# Patient Record
Sex: Male | Born: 1955
Health system: Southern US, Community
[De-identification: ages and names within clinical notes are randomized; demographics above are authoritative.]

## PROBLEM LIST (undated history)

## (undated) DIAGNOSIS — N400 Enlarged prostate without lower urinary tract symptoms: Secondary | ICD-10-CM

## (undated) DIAGNOSIS — K219 Gastro-esophageal reflux disease without esophagitis: Secondary | ICD-10-CM

## (undated) DIAGNOSIS — M1990 Unspecified osteoarthritis, unspecified site: Secondary | ICD-10-CM

## (undated) DIAGNOSIS — D649 Anemia, unspecified: Secondary | ICD-10-CM

## (undated) DIAGNOSIS — I739 Peripheral vascular disease, unspecified: Secondary | ICD-10-CM

## (undated) DIAGNOSIS — E785 Hyperlipidemia, unspecified: Secondary | ICD-10-CM

## (undated) DIAGNOSIS — M109 Gout, unspecified: Secondary | ICD-10-CM

## (undated) DIAGNOSIS — I10 Essential (primary) hypertension: Secondary | ICD-10-CM

## (undated) DIAGNOSIS — J449 Chronic obstructive pulmonary disease, unspecified: Secondary | ICD-10-CM

## (undated) HISTORY — PX: FACIAL COSMETIC SURGERY: SHX629

## (undated) HISTORY — PX: OTHER SURGICAL HISTORY: SHX169

## (undated) HISTORY — DX: Gout, unspecified: M10.9

## (undated) HISTORY — DX: Benign prostatic hyperplasia without lower urinary tract symptoms: N40.0

## (undated) HISTORY — DX: Anemia, unspecified: D64.9

## (undated) HISTORY — DX: Chronic obstructive pulmonary disease, unspecified: J44.9

## (undated) HISTORY — DX: Hyperlipidemia, unspecified: E78.5

---

## 1999-10-04 ENCOUNTER — Encounter: Payer: Self-pay | Admitting: Emergency Medicine

## 1999-10-04 ENCOUNTER — Emergency Department (HOSPITAL_COMMUNITY): Admission: EM | Admit: 1999-10-04 | Discharge: 1999-10-04 | Payer: Self-pay | Admitting: Emergency Medicine

## 2002-10-13 ENCOUNTER — Emergency Department (HOSPITAL_COMMUNITY): Admission: EM | Admit: 2002-10-13 | Discharge: 2002-10-13 | Payer: Self-pay | Admitting: Emergency Medicine

## 2005-01-23 ENCOUNTER — Emergency Department (HOSPITAL_COMMUNITY): Admission: EM | Admit: 2005-01-23 | Discharge: 2005-01-23 | Payer: Self-pay | Admitting: Emergency Medicine

## 2006-02-17 ENCOUNTER — Ambulatory Visit (HOSPITAL_COMMUNITY): Admission: RE | Admit: 2006-02-17 | Discharge: 2006-02-17 | Payer: Self-pay | Admitting: Internal Medicine

## 2006-02-17 ENCOUNTER — Ambulatory Visit: Payer: Self-pay | Admitting: Internal Medicine

## 2006-03-29 ENCOUNTER — Ambulatory Visit: Payer: Self-pay | Admitting: Internal Medicine

## 2006-03-29 ENCOUNTER — Ambulatory Visit (HOSPITAL_COMMUNITY): Admission: RE | Admit: 2006-03-29 | Discharge: 2006-03-29 | Payer: Self-pay | Admitting: Internal Medicine

## 2006-09-22 ENCOUNTER — Telehealth: Payer: Self-pay | Admitting: *Deleted

## 2006-12-24 ENCOUNTER — Telehealth: Payer: Self-pay | Admitting: *Deleted

## 2007-01-04 DIAGNOSIS — M25539 Pain in unspecified wrist: Secondary | ICD-10-CM | POA: Insufficient documentation

## 2007-01-04 DIAGNOSIS — F17200 Nicotine dependence, unspecified, uncomplicated: Secondary | ICD-10-CM | POA: Insufficient documentation

## 2007-01-04 DIAGNOSIS — F1010 Alcohol abuse, uncomplicated: Secondary | ICD-10-CM | POA: Insufficient documentation

## 2007-01-04 DIAGNOSIS — M79609 Pain in unspecified limb: Secondary | ICD-10-CM | POA: Insufficient documentation

## 2007-01-04 DIAGNOSIS — K219 Gastro-esophageal reflux disease without esophagitis: Secondary | ICD-10-CM | POA: Insufficient documentation

## 2007-01-04 DIAGNOSIS — R1110 Vomiting, unspecified: Secondary | ICD-10-CM | POA: Insufficient documentation

## 2007-01-04 DIAGNOSIS — J069 Acute upper respiratory infection, unspecified: Secondary | ICD-10-CM | POA: Insufficient documentation

## 2007-01-04 DIAGNOSIS — R030 Elevated blood-pressure reading, without diagnosis of hypertension: Secondary | ICD-10-CM | POA: Insufficient documentation

## 2007-04-05 ENCOUNTER — Telehealth: Payer: Self-pay | Admitting: *Deleted

## 2011-07-22 ENCOUNTER — Ambulatory Visit (INDEPENDENT_AMBULATORY_CARE_PROVIDER_SITE_OTHER): Payer: BC Managed Care – PPO | Admitting: Family Medicine

## 2011-07-22 ENCOUNTER — Ambulatory Visit: Payer: Self-pay | Admitting: Family Medicine

## 2011-07-22 DIAGNOSIS — L723 Sebaceous cyst: Secondary | ICD-10-CM

## 2011-09-27 ENCOUNTER — Ambulatory Visit (INDEPENDENT_AMBULATORY_CARE_PROVIDER_SITE_OTHER): Payer: BC Managed Care – PPO | Admitting: Family Medicine

## 2011-09-27 VITALS — BP 159/95 | HR 90 | Temp 97.6°F | Resp 16 | Ht 68.5 in | Wt 183.4 lb

## 2011-09-27 DIAGNOSIS — IMO0001 Reserved for inherently not codable concepts without codable children: Secondary | ICD-10-CM

## 2011-09-27 DIAGNOSIS — M25569 Pain in unspecified knee: Secondary | ICD-10-CM

## 2011-09-27 DIAGNOSIS — Z716 Tobacco abuse counseling: Secondary | ICD-10-CM

## 2011-09-27 DIAGNOSIS — F17200 Nicotine dependence, unspecified, uncomplicated: Secondary | ICD-10-CM

## 2011-09-27 DIAGNOSIS — Z720 Tobacco use: Secondary | ICD-10-CM

## 2011-09-27 LAB — URIC ACID: Uric Acid, Serum: 8.8 mg/dL — ABNORMAL HIGH (ref 4.0–7.8)

## 2011-09-27 MED ORDER — VARENICLINE TARTRATE 0.5 MG X 11 & 1 MG X 42 PO MISC: ORAL | Status: AC

## 2011-09-27 MED ORDER — VARENICLINE TARTRATE 1 MG PO TABS: ORAL_TABLET | ORAL | Status: DC

## 2011-09-27 MED ORDER — INDOMETHACIN 50 MG PO CAPS: 50.0000 mg | ORAL_CAPSULE | Freq: Two times a day (BID) | ORAL | Status: DC

## 2011-09-27 NOTE — Progress Notes (Signed)
  [facility]:  Office Visit  Chief Complaint:  Chief Complaint  Patient presents with  . Arthritis    suspects gout left knee. flare-up X 2 days  . Eye Problem    skin tag on right eyelid     HPI: Hector [name] is a 56 y.o. male who complains of  Left knee pain x 1 day. H/o gout. Has not tried anything. + gout and increase   History reviewed. No pertinent past medical history. Past Surgical History  Procedure Date  . Right hand fracture    History   Social History  . Marital Status: Married    Spouse Name: N/A    Number of Children: N/A  . Years of Education: N/A   Social History Main Topics  . Smoking status: Current Everyday Smoker -- 2.0 packs/day for 30 years    Types: Cigarettes  . Smokeless tobacco: None  . Alcohol Use: 4.2 oz/week    7 Cans of beer per week  . Drug Use: No  . Sexually Active: None   Other Topics Concern  . None   Social History Narrative  . None   Family History  Problem Relation Age of Onset  . Cancer Mother    No Known Allergies Prior to Admission medications   Not on File     ROS: The patient denies fevers, chills, night sweats, unintentional weight loss, chest pain, palpitations, wheezing, dyspnea on exertion, nausea, vomiting, abdominal pain, dysuria, hematuria, melena, numbness, weakness, or tingling.+ knee pain  All other systems have been reviewed and were otherwise negative with the exception of those mentioned in the HPI and as above.    PHYSICAL EXAM: Filed Vitals:   09/27/11 1123  BP: 159/95  Pulse:   Temp:   Resp:    Filed Vitals:   09/27/11 1036  Height: 5' 8.5" (1.74 m)  Weight: 183 lb 6.4 oz (83.19 kg)   Body mass index is 27.48 kg/(m^2).  General: Alert, no acute distress HEENT:  Normocephalic, atraumatic, oropharynx patent.  Cardiovascular:  Regular rate and rhythm, no rubs murmurs or gallops.  No Carotid bruits, radial pulse intact. No pedal edema.  Respiratory: Clear to  auscultation bilaterally.  No wheezes, rales, or rhonchi.  No cyanosis, no use of accessory musculature GI: No organomegaly, abdomen is soft and non-tender, positive bowel sounds.  No masses. Skin: No rashes. Neurologic: Facial musculature symmetric. Psychiatric: Patient is appropriate throughout our interaction. Lymphatic: No cervical lymphadenopathy Musculoskeletal: Gait intact. Left knee-medial jt line tenderness, no effusion, otherwise normal, + prominent boney structure but no erythema.    LABS: No results found for this or any previous visit.   EKG/XRAY:   Primary read interpreted by Dr. Conley Rolls at [facility].   ASSESSMENT/PLAN: Encounter Diagnoses  Name Primary?  . Knee pain Yes  . Tobacco abuse counseling   . Tobacco abuse   . Elevated BP    Check uric acid Rx INdomethacin x 7 days. Take with food Wants to quit smoking. Will give strater pack for CHantix and then also give 2 month supply. Denies SI/HI. Risks and benefits explained to patient.  Monitor BP if higher than 140/90 then come back for HTN , currently not diagnosed.     Charlie Char PHUONG, DO 09/27/2011 11:24 AM

## 2011-10-05 ENCOUNTER — Telehealth: Payer: Self-pay | Admitting: Family Medicine

## 2011-10-05 ENCOUNTER — Other Ambulatory Visit: Payer: Self-pay | Admitting: Family Medicine

## 2011-10-05 DIAGNOSIS — M25569 Pain in unspecified knee: Secondary | ICD-10-CM

## 2011-10-05 MED ORDER — INDOMETHACIN 50 MG PO CAPS: 50.0000 mg | ORAL_CAPSULE | Freq: Two times a day (BID) | ORAL | Status: DC

## 2011-10-05 NOTE — Telephone Encounter (Signed)
Spoke to patient about uric acid results. Will give him one refill on indomethacin and if he has another gouty flare-up he can take it but needs to consider maintenance with allopurinol. I am hesitant to do this since he drinks 6-7 beers daily. Needs f/uy for blood pressure and also tobacco cessation.

## 2012-01-03 ENCOUNTER — Ambulatory Visit (INDEPENDENT_AMBULATORY_CARE_PROVIDER_SITE_OTHER): Payer: BC Managed Care – PPO | Admitting: Emergency Medicine

## 2012-01-03 VITALS — BP 102/68 | HR 99 | Temp 97.9°F | Resp 18 | Ht 68.75 in | Wt 176.6 lb

## 2012-01-03 DIAGNOSIS — T675XXA Heat exhaustion, unspecified, initial encounter: Secondary | ICD-10-CM

## 2012-01-03 LAB — POCT CBC
Granulocyte percent: 64 %G (ref 37–80)
HCT, POC: 45.9 % (ref 43.5–53.7)
Lymph, poc: 2.6 (ref 0.6–3.4)
MCHC: 31.2 g/dL — AB (ref 31.8–35.4)
MCV: 88.7 fL (ref 80–97)
POC LYMPH PERCENT: 28.1 %L (ref 10–50)
RDW, POC: 13.3 %

## 2012-01-03 LAB — BASIC METABOLIC PANEL
Calcium: 9.6 mg/dL (ref 8.4–10.5)
Creat: 0.95 mg/dL (ref 0.50–1.35)

## 2012-01-03 NOTE — Patient Instructions (Signed)
Heat Disorders  Heat related disorders are illnesses caused by continued exposure to hot and humid environments, not drinking enough fluids, and/or your body failing to regulate its temperature correctly. People suffer from heat stress and heat related disorders when their bodies are unable to compensate and cool down through sweating. With sufficient heat, sweating is not enough to keep you cool, and your body temperature can rise quickly. Very high body temperatures can damage your brain and other vital organs. High humidity (moisture in the air), adds to heat stress, because it is harder for sweat to evaporate and cool your body. Heat stress and disorders are not uncommon. Some medicines can increase your risk for heat related illness. Ask your caregiver about your medicines during periods of intense heat.   Heat related disorders include:   Heatstroke. When you cannot sweat or regulate your body temperature in an adequate way. This is very dangerous and can be life threatening. Get emergency medical help.   Heat exhaustion. Overheating causes heavy sweating and a fast heart rate. Your body can still regulate its own temperature.   Heat cramps. Painful, uncontrollable muscle spasms. Can occur during heavy exercise in hot environments.   Sunburn. Skin becomes red and painful (burned) after being out in the sun.   Heat rash. Sweat ducts become blocked, which traps sweat under the skin. This causes blisters and red bumps and may cause an itchy or tingling feeling.  PREVENTING HEAT STRESS AND HEAT RELATED DISORDERS  Overheating can be dangerous and life threatening. When exercising, working, or doing other activities in hot and humid environments, do the following:   Stay informed by listening to and watching local broadcast weather and safety updates during intense heat.   Air conditioning is the best way to prevent heat disorders. If your home is not air conditioned, spend time in air conditioned places  (malls, public libraries, or heat shelters set up by your local health department).   Wear light-weight, light colored, loose fitting clothing. Wear as little clothing as possible when at home.   Increase your fluid intake. Drink enough water and fluids to keep your urine clear or pale yellow. DO NOT WAIT UNTIL YOU ARE THIRSTY TO DRINK. You may already be heat stressed, and not recognize it.   If your caregiver has suggested that you limit the amount of fluid you drink or has prescribed water pills for a medical problem, ask how much you should drink when the weather is hot.   Do not drink liquids with alcohol, caffeine, or lots of sugar. They can cause more loss of body fluid.   Heavy sweating drains your body's salt and minerals, which must be replaced. If you must exercise in the heat, a sports beverage can replace the salt and minerals you lose in sweat. If you are on a low-salt diet, check with your caregiver before drinking a sports beverage.   Sunburn reduces your body's ability to cool itself and causes a loss of needed body fluids. If you go outdoors, protect yourself from the sun by wearing a wide-brimmed hat, along with sunglasses.   Put on sunscreen of SPF 15 or higher, 30 minutes before going out. (The most effective products say "broad spectrum" or "UVA/UVB protection" on the label.) Reapply sunscreen frequently -- at least every 1-2 hours.   Take added precautions when both the heat and humidity are high.   Rest often.   Even young and otherwise healthy people can become heat stressed and suffer   from a heat disorder, if they participate in strenuous activities during hot weather.   If you must be outdoors, try going out only during morning and evening hours, when it is cooler. Rest often in shady areas, so that your body's temperature can adjust.   If your heart pounds or you are gasping for breath, STOP all activity. Go immediately to a cool area, or at least into the shade, and rest.  This is especially true if you become lightheaded, confused, weak, or faint.   Electric fans may make you comfortable, but they DO NOT prevent heat related problems.  SYMPTOMS    Headache.   Nosebleed.   Weakness.   You feel very hot.   Muscle cramps.   Restlessness.   Fainting or dizziness.   Fast breathing and shortness of breath.   Excessive sweating. (There may be little or no sweating in late stages of heat exhaustion.)   Rapid pulse, heart pounding.   Feeling sick to your stomach (nauseous, vomiting).   Skin becoming cold and clammy, or excessively hot and dry.  HOME CARE INSTRUCTIONS    Lie down and rest in a cool or air conditioned area.   Drink enough water and fluids to keep your urine clear or pale yellow. Avoid fluids with caffeine or high sugar content. Avoid coffee, tea, alcohol or stimulants.   Do not take salt tablets, unless advised by your caregiver.   Avoid hot foods and heavy meals.   Bathe or shower in cool water.   Wear minimal clothing.   Use a fan. Add cool or warm mist to the air, if possible.   If possible, decrease the use of your stove or oven at home.   Monitor adults at risk at least twice a day, watching closely for signs of heat exhaustion or heat stroke. Infants and young children also require more frequent watching.   Never leave infants, children or pets in a parked car, even if the windows are cracked open.   If you are 65 years of age or older, have a friend or relative call to check on you twice a day during a heat wave. If you know someone in this age group, check on them at least twice a day.  SEEK IMMEDIATE MEDICAL CARE IF:   You have a hard time breathing.   You vomit or pass blood in your stool.   You have a seizure, feel dizzy or faint, or pass out.   You develop severe sweating.   Your skin is red, hot and dry (there is no sweating).   Your urine turns a dark color or has blood in it.   You are making very little or no urine.   You are  unable to keep fluids down.   You develop chest or abdominal pain.   You develop a throbbing headache.   You develop nausea or confusion.  IF YOU OBSERVE SOMEONE WHO MIGHT HAVE HEAT STROKE  This can be life threatening. Call your local emergency services (911 in the [place]   If the victim is in the sun, get him or her to a shady area.   Cool the victim rapidly, using whatever methods you have:   Place the victim in a tub of cool water or a cool shower.   Spray the victim with cool water from a garden hose, or sponge the person with cool water.   Wrap the victim in a cool, wet sheet and fan them.     If emergency medical help is delayed, call the hospital emergency room or your local emergency services (911 in the [place] for further instructions.   Sometimes a victim's muscles will begin to twitch from heat stroke. If this happens, keep the victim from injuring himself. However, do not place any object in the mouth. Give fluids, unless the muscle twitching makes it difficult or unsafe to do so. If there is vomiting, make sure the airway remains open by turning the victim on his or her side.  Document Released: 06/19/2000 Document Revised: 06/11/2011 Document Reviewed: 04/08/2009  [employer] Patient Information 2012 [employer].

## 2012-01-03 NOTE — Progress Notes (Signed)
  Subjective:    Patient ID: Hector [name], male    DOB: 1955-08-12, 56 y.o.   MRN: [id number]  HPI Comments: Works as a Designer, fashion/clothing working outside.  Heavy sweater and attempts to remain hydrated.  Becomes fatigued.  Says that on Friday he developed a 'cramp' in the tongue and weakness in his had that resolved prior to return home.  Feeling "bad' for 2 weeks.  Extremity Weakness  This is a new problem. The current episode started in the past 7 days. There has been no history of extremity trauma. The problem occurs intermittently. The problem has been resolved. The patient is experiencing no pain. Associated symptoms include numbness. Pertinent negatives include no fever, inability to bear weight, joint locking, joint swelling or stiffness. The symptoms are aggravated by activity and heat. He has tried rest for the symptoms. The treatment provided significant relief. Family history does not include gout or rheumatoid arthritis. There is no history of diabetes, gout, osteoarthritis or rheumatoid arthritis.      Review of Systems  Constitutional: Positive for fatigue. Negative for fever, chills, diaphoresis, activity change, appetite change and unexpected weight change.  HENT: Negative.   Eyes: Negative.   Respiratory: Negative.   Cardiovascular: Negative.   Gastrointestinal: Negative.   Genitourinary: Negative.   Musculoskeletal: Positive for extremity weakness. Negative for myalgias, back pain, joint swelling, arthralgias, stiffness and gout.  Skin: Negative.   Neurological: Positive for numbness.  Hematological: Negative.        Objective:   Physical Exam  Constitutional: He is oriented to person, place, and time. He appears well-developed and well-nourished.  HENT:  Head: Normocephalic and atraumatic.  Right Ear: External ear normal.  Left Ear: External ear normal.  Eyes: Conjunctivae and EOM are normal. Pupils are equal, round, and reactive to light. No scleral icterus.  Neck: Normal  range of motion. Neck supple.  Cardiovascular: Normal rate, regular rhythm and normal heart sounds.  Exam reveals no gallop and no friction rub.   No murmur heard. Pulmonary/Chest: Effort normal and breath sounds normal.  Abdominal: Soft.  Musculoskeletal: Normal range of motion. He exhibits no edema and no tenderness.  Neurological: He is alert and oriented to person, place, and time. He exhibits normal muscle tone. Coordination normal.  Skin: Skin is warm and dry.          Assessment & Plan:  Likely heat exhaustion Encouraged to drink more fluids.  Doubt cns source.  Will check labs

## 2012-07-05 ENCOUNTER — Other Ambulatory Visit: Payer: Self-pay | Admitting: Family Medicine

## 2012-07-05 ENCOUNTER — Telehealth: Payer: Self-pay

## 2012-07-05 NOTE — Telephone Encounter (Signed)
Left message, for him to call back, what medication is he in need of?

## 2012-07-05 NOTE — Telephone Encounter (Signed)
Needs OV.  

## 2012-07-05 NOTE — Telephone Encounter (Signed)
Patient request refill of medication for gout. State wife threw out rx. [facility] [phone]

## 2012-07-06 NOTE — Telephone Encounter (Signed)
Indocin sent in yesterday.

## 2012-09-28 ENCOUNTER — Telehealth: Payer: Self-pay

## 2012-09-28 MED ORDER — INDOMETHACIN 50 MG PO CAPS: 50.0000 mg | ORAL_CAPSULE | Freq: Two times a day (BID) | ORAL | Status: DC

## 2012-09-28 NOTE — Telephone Encounter (Signed)
Please advise, patient was advised last fill in Dec, he was due for visit.

## 2012-09-28 NOTE — Telephone Encounter (Signed)
Refill  indomethacin (INDOCIN) 50 MG capsule Gout medicines  [facility]   [phone] (M)

## 2012-09-28 NOTE — Telephone Encounter (Signed)
Sent to pharmacy, but pt due for OV

## 2012-10-16 ENCOUNTER — Ambulatory Visit: Payer: BC Managed Care – PPO

## 2012-10-16 ENCOUNTER — Ambulatory Visit (INDEPENDENT_AMBULATORY_CARE_PROVIDER_SITE_OTHER): Payer: BC Managed Care – PPO | Admitting: Family Medicine

## 2012-10-16 VITALS — BP 140/88 | HR 92 | Temp 98.5°F | Resp 16 | Ht 69.0 in | Wt 186.0 lb

## 2012-10-16 DIAGNOSIS — IMO0002 Reserved for concepts with insufficient information to code with codable children: Secondary | ICD-10-CM

## 2012-10-16 DIAGNOSIS — M25562 Pain in left knee: Secondary | ICD-10-CM

## 2012-10-16 DIAGNOSIS — M1710 Unilateral primary osteoarthritis, unspecified knee: Secondary | ICD-10-CM

## 2012-10-16 DIAGNOSIS — M1712 Unilateral primary osteoarthritis, left knee: Secondary | ICD-10-CM

## 2012-10-16 DIAGNOSIS — M25569 Pain in unspecified knee: Secondary | ICD-10-CM

## 2012-10-16 MED ORDER — MELOXICAM 15 MG PO TABS: 15.0000 mg | ORAL_TABLET | Freq: Every day | ORAL | Status: DC

## 2012-10-16 NOTE — Progress Notes (Signed)
[address]   [place], [place]  [address]   [phone]  Subjective:    Patient ID: Hector [name], male    DOB: [birth year], 57 y.o.   MRN: [id number]  HPI This 57 y.o. male presents for evaluation of knee pain L.  Evaluated one year ago for L knee pain.  No injury to knee; did twist knee years ago.  +swelling.  Swelling mild today; has some tightness in back.  +intermittent popping.  No giving out.  Prescribed Indomethacin 09/2011 with some relief.  No other medications.  Daily pain.  No nighttime awakening.  Climbing stairs, bending, squatting.  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     Review of Systems  Constitutional: Negative for fever, chills, diaphoresis and fatigue.  Musculoskeletal: Positive for joint swelling, arthralgias and gait problem.  Skin: Negative for color change, pallor, rash and wound.    Past Medical History  Diagnosis Date  . Gout     L knee    Past Surgical History  Procedure Laterality Date  . Right hand fracture      Prior to Admission medications   Medication Sig Start Date End Date Taking? Authorizing Provider  indomethacin (INDOCIN) 50 MG capsule Take 1 capsule (50 mg total) by mouth 2 (two) times daily with a meal. NEED OFFICE VISIT 09/28/12   Godfrey Pick, PA-C  varenicline (CHANTIX CONTINUING MONTH PAK) 1 MG tablet Use this once you are done with the starter pack 09/27/11   Thao P Le, DO    No Known Allergies  History   Social History  . Marital Status: Single    Spouse Name: N/A    Number of Children: N/A  . Years of Education: N/A   Occupational History  . Not on file.   Social History Main Topics  . Smoking status: Current Every Day Smoker -- 2.00 packs/day for 30 years    Types: Cigarettes  . Smokeless tobacco: Not on file  . Alcohol Use: 4.2 oz/week    7 Cans of beer per week  . Drug Use: No  . Sexually Active: Not on file   Other Topics Concern  . 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 Cancer Mother        Objective:   Physical Exam  Nursing note and vitals reviewed. Constitutional: He is oriented to person, place, and time. He appears well-developed and well-nourished. No distress.  Musculoskeletal:       Left knee: He exhibits normal range of motion, no swelling, no effusion, no ecchymosis, no deformity, no laceration, no erythema, normal alignment, no bony tenderness, normal meniscus and no MCL laxity. Tenderness found. Medial joint line and lateral joint line tenderness noted. No MCL, no LCL and no patellar tendon tenderness noted.  L KNEE:  NO EFFUSION; +TTP MEDIAL AND LATERAL JOINT LINES; NO PATELLAR TTP; NO PATELLAR TENDON TENDERNESS; FULL EXTENSION AND FLEXION; MILD DISCOMFORT WITH FLEXION.  LACHMAN'S AND ANTERIOR DRAWER NEGATIVE; V/V STRAIN INTACT.  NORMAL GAIT.  Neurological: He is alert and oriented to person, place, and time.  Skin: Skin is warm and dry. No rash noted. He is not diaphoretic. No erythema.  NO WARMTH TO SKIN ALONG KNEE L.  Psychiatric: He has a normal mood and affect. His behavior is normal.   [facility] reading (PRIMARY) by  Dr. Katrinka Blazing.  L KNEE XRAY:  MODERATE DEGENERATIVE CHANGES WITH SPURRING.       Assessment & Plan:  Pain, knee, left - Plan: DG Knee Complete 4 Views Left  Osteoarthritis of left knee   1.  L knee pain:  Persistent.  Consistent with osteoarthritis in origin; not consistent with gouty attack.  Rx for Meloxicam provided. 2.  L knee osteoarthritis:  New.  Rx for Meloxicam provided; recommend rest, ice, elevate, NSAIDs.  Refer to ortho due to chronic nature of current pain.  Meds ordered this encounter  Medications  . meloxicam (MOBIC) 15 MG tablet    Sig: Take 1 tablet (15 mg total) by mouth daily. As needed for knee pain    Dispense:  30 tablet    Refill:  3

## 2012-10-16 NOTE — Patient Instructions (Addendum)
Pain, knee, left - Plan: DG Knee Complete 4 Views Left, meloxicam (MOBIC) 15 MG tablet  Osteoarthritis of left knee - Plan: meloxicam (MOBIC) 15 MG tablet  Combined Knee Ligament Sprain Combined knee ligament sprain is a tear of more than one of the major ligaments of the knee. The four knee ligaments are the anterior cruciate ligament (ACL), posterior cruciate ligament (PCL), medial collateral ligament (MCL) and lateral collateral ligament (LCL). Ligaments connect bones. They often cross a joint to hold the bones together. The ligaments of the knee keep the thigh bone (femur) and shinbone (tibia) in alignment. These ligaments allow the joint to move within a certain range of motion. Movement outside this range causes a ligament strain. Injury to multiple ligaments at the same time results in difficulty playing sports and in daily living. The most common multiple knee ligament injury involves the ACL and MCL. SYMPTOMS   A "popping" sound heard or felt at the time of injury.  Inability to continue activity after injury.  Inflammation of the knee within 6 hours after injury.  Possibly, deformity of the knee.  Inability to straighten the knee.  Feeling of the knee giving way or buckling.  Sometimes, locking of the knee, if the joint cartilage (meniscus) is injured.  Rarely, numbness, weakness, paralysis, discoloration, or coldness, due to nerve or blood vessel injury. CAUSES  Spraining of multiple ligaments occurs when a force is placed on the ligaments that exceeds their strength. This is often caused by a direct hit (trauma). It may also be caused by a non-contact injury (hyperextending the knee while twisting it).  RISK INCREASES WITH:  Contact sports (football, rugby, lacrosse). Sports that involve pivoting, jumping, cutting, or changing direction (basketball, gymnastics, soccer, volleyball). Sports on uneven ground (cross-country running, soccer).  Poor strength and/or  flexibility.  Improper fitted or padded equipment. PREVENTION  Warm up and stretch properly before activity.  Maintain physical fitness:  Thigh, leg, and knee flexibility.  Muscle strength and endurance.  Learn and use proper exercise technique.  Wear proper and well fitting equipment (correct length of cleats for surface). PROGNOSIS  Without treatment, the knee will continue to give way and become vulnerable to recurring injury. Recurring injury can happen during athletics or daily living. If the injury includes damage to a nerve or artery, the chance of a poor outcome increases. Surgery is often needed to regain stability of the knee. RELATED COMPLICATIONS  Frequently recurring symptoms, including:  Knee giving way.  Joint instability.  Inflammation.  Injury to the joint cartilage (meniscus). This may result in locking and/or swelling of the knee.  Injury to joint (articular) cartilage of the thigh bone or shinbone. This may result in arthritis of the knee.  Injury to other ligaments of the knee.  Knee stiffness (loss of knee motion).  Permanent injury to nerves (numbness, weakness, or paralysis) or arteries.  Removal (amputation) of the leg, due to nerve or artery injury. TREATMENT  Treatment first involves medicine and ice, to reduce pain and inflammation. Crutches may be advised, to decrease pain while walking. The knee may be restrained. Rehabilitation focuses on reducing swelling, regaining range of motion, and regaining muscle control and strength. It may also include receiving proper use training, wearing a brace, and education. (Avoid sports that involve pivoting, cutting, changing direction, jumping and landing). Surgery often offers the best chance for full recovery. Surgery from combined ACL/MCL injury involves replacement (reconstruction) of the ACL. This also allows for MCL healing. Despite surgery, some  athletes may never return to their prior level of  competition. The ability to return to sports depends on the related injuries and demands of the sport.  MEDICATION   If pain medicine is needed, nonsteroidal anti-inflammatory medicines (aspirin and ibuprofen), or other minor pain relievers (acetaminophen), are often advised.  Do not take pain medicine for 7 days before surgery.  Stronger pain relievers may be prescribed. Use only as directed and only as much as you need.  Contact your caregiver immediately if any bleeding, stomach upset, or signs of an allergic reaction occur. COLD THERAPY  Cold treatment (icing) should be applied for 10 to 15 minutes every 2 to 3 hours for inflammation and pain, and immediately after activity that aggravates your symptoms. Use ice packs or an ice massage. SEEK MEDICAL CARE IF:   Symptoms get worse or do not improve in 6 weeks, despite treatment.  After injury or surgery, any of the following occur:  Pain, numbness, coldness, or a blue, gray, or dark color occurs in the foot or toenails.  Increased pain, swelling, redness, drainage of fluids, or bleeding in the affected area.  Signs of infection (headache, muscle aches, dizziness, or a general ill feeling with fever).  New, unexplained symptoms develop. (Drugs used in treatment may produce side effects.) Document Released: 06/22/2005 Document Revised: 09/14/2011 Document Reviewed: 10/04/2008 [facility] Patient Information 2013 [place], [place].

## 2012-10-31 ENCOUNTER — Telehealth: Payer: Self-pay

## 2012-10-31 NOTE — Telephone Encounter (Signed)
Patient has appt may 14th

## 2012-10-31 NOTE — Telephone Encounter (Signed)
Call ---  I referred pt to ortho; when is or was his appointment with orthopedic doctor for chronic knee pain?

## 2012-10-31 NOTE — Telephone Encounter (Signed)
PATIENT STATES HE TOOK ALL OF HIS PAIN MEDS - THEY WERE NOT STRONG ENOUGH FOR THE HIM.  HE WOULD LIKE STRONGER MEDICATIONS   [phone] [facility]

## 2012-11-01 MED ORDER — TRAMADOL HCL 50 MG PO TABS: 50.0000 mg | ORAL_TABLET | Freq: Four times a day (QID) | ORAL | Status: DC | PRN

## 2012-11-01 NOTE — Telephone Encounter (Signed)
Mailbox full unable to leave message.

## 2012-11-01 NOTE — Telephone Encounter (Signed)
Call--- 1. Would refill Meloxicam which is an anti-inflammatory which is good for arthritis.  2.  I have sent in rx for Tramadol which treats pain only. He can take Tramadol in addition to Meloxicam for pain.  3. Important to keep appointment with ortho on 11/16/12.

## 2012-11-02 NOTE — Telephone Encounter (Signed)
Left message to advise

## 2012-12-05 ENCOUNTER — Telehealth: Payer: Self-pay

## 2012-12-05 NOTE — Telephone Encounter (Signed)
Pt requesting refill on his gout meds. Not sure what med it was as he has lost the bottle.  [facility]  [phone]

## 2012-12-06 ENCOUNTER — Other Ambulatory Visit: Payer: Self-pay | Admitting: Physician Assistant

## 2012-12-06 NOTE — Telephone Encounter (Signed)
Patient is calling about getting a refill on his gout medication. [phone]

## 2012-12-06 NOTE — Telephone Encounter (Signed)
Has he seen ortho?  Called him to advise to have pharmacy send a request for the medication, he will do this. He did see ortho but does not recall who he has seen. He states ortho was for knee, current pain is in his foot. PA will review the request and advise if visit needed, Amy

## 2012-12-07 ENCOUNTER — Encounter (HOSPITAL_COMMUNITY): Payer: Self-pay | Admitting: *Deleted

## 2012-12-07 ENCOUNTER — Emergency Department (INDEPENDENT_AMBULATORY_CARE_PROVIDER_SITE_OTHER)
Admission: EM | Admit: 2012-12-07 | Discharge: 2012-12-07 | Disposition: A | Discharge status: Home / Self Care | Payer: BC Managed Care – PPO | Source: Home / Self Care | Attending: Family Medicine | Admitting: Family Medicine

## 2012-12-07 DIAGNOSIS — M109 Gout, unspecified: Secondary | ICD-10-CM

## 2012-12-07 MED ORDER — COLCHICINE 0.6 MG PO TABS: 0.6000 mg | ORAL_TABLET | Freq: Two times a day (BID) | ORAL | Status: DC

## 2012-12-07 MED ORDER — ALLOPURINOL 300 MG PO TABS: 300.0000 mg | ORAL_TABLET | Freq: Every day | ORAL | Status: DC

## 2012-12-07 NOTE — ED Notes (Signed)
Pt  Has  A  History  Of  Gout   He  Reports  Pain r  Foot  X  3  Days   He  Takes  No  meds     Other  Than otc  meds  For  gerd

## 2012-12-07 NOTE — ED Provider Notes (Addendum)
History     CSN: [id number]  Arrival date & time 12/07/12  1102   First MD Initiated Contact with Patient 12/07/12 1234      Chief Complaint  Patient presents with  . Foot Pain    (Consider location/radiation/quality/duration/timing/severity/associated sxs/prior treatment) Patient is a 57 y.o. male presenting with lower extremity pain. The history is provided by the patient.  Foot Pain This is a chronic problem. The current episode started more than 2 days ago. The problem has been gradually worsening. The symptoms are aggravated by walking.    Past Medical History  Diagnosis Date  . Gout     L knee    Past Surgical History  Procedure Laterality Date  . Right hand fracture      Family History  Problem Relation Age of Onset  . Cancer Mother     History  Substance Use Topics  . Smoking status: Current Every Day Smoker -- 2.00 packs/day for 30 years    Types: Cigarettes  . Smokeless tobacco: Not on file  . Alcohol Use: 4.2 oz/week    7 Cans of beer per week      Review of Systems  Constitutional: Negative.   Musculoskeletal: Positive for joint swelling and gait problem.  Skin: Negative.     Allergies  Review of patient's allergies indicates no known allergies.  Home Medications   Current Outpatient Rx  Name  Route  Sig  Dispense  Refill  . allopurinol (ZYLOPRIM) 300 MG tablet   Oral   Take 1 tablet (300 mg total) by mouth daily. Start with 1/2 tab daily for 10 days   90 tablet   1   . colchicine 0.6 MG tablet   Oral   Take 1 tablet (0.6 mg total) by mouth 2 (two) times daily.   30 tablet   1   . indomethacin (INDOCIN) 50 MG capsule      TAKE 1 CAPSULE (50 MG TOTAL) BY MOUTH 2 (TWO) TIMES DAILY WITH A MEAL. NEED OFFICE VISIT   14 capsule   0   . meloxicam (MOBIC) 15 MG tablet   Oral   Take 1 tablet (15 mg total) by mouth daily. As needed for knee pain   30 tablet   3   . traMADol (ULTRAM) 50 MG tablet   Oral   Take 1 tablet (50 mg  total) by mouth every 6 (six) hours as needed for pain.   40 tablet   0   . varenicline (CHANTIX CONTINUING MONTH PAK) 1 MG tablet      Use this once you are done with the starter pack   60 tablet   1     BP 158/75  Pulse 92  Temp(Src) 98.3 F (36.8 C) (Oral)  Resp 16  SpO2 97%  Physical Exam  Nursing note and vitals reviewed. Constitutional: He is oriented to person, place, and time. He appears well-developed and well-nourished.  Musculoskeletal: He exhibits tenderness.       Feet:  Neurological: He is alert and oriented to person, place, and time.  Skin: Skin is warm and dry.    ED Course  Procedures (including critical care time)  Labs Reviewed  URIC ACID - Abnormal; Notable for the following:    Uric Acid, Serum 8.4 (*)    All other components within normal limits   No results found.   1. Gout flare       MDM  Linna Hoff, MD 12/07/12 1342  Linna Hoff, MD 12/19/12 2035

## 2012-12-07 NOTE — Discharge Instructions (Signed)
We will call if test shows a need for different medicine.

## 2012-12-15 NOTE — ED Notes (Signed)
Uric Acid 8.4 H.  Pt. adequately treated with Colchicine, Allopurinol, and Indocin. Hector [name] 12/15/2012

## 2012-12-20 ENCOUNTER — Telehealth (HOSPITAL_COMMUNITY): Payer: Self-pay | Admitting: *Deleted

## 2012-12-20 NOTE — ED Notes (Signed)
I called pt.  Pt. verified x 2 and given results.  Pt. told he was adequately treated wit the Colchicine, Indocin and Allopurinol. Pt. states does not have a PCP.  Pt. instructed to get one to follow him for the gout. He will need refills of the Allopurinol to lower his uric acid level. This will help prevent future outbreaks of the gout.  Pt. voiced understanding. Vassie Moselle 12/20/2012

## 2013-01-28 ENCOUNTER — Ambulatory Visit (INDEPENDENT_AMBULATORY_CARE_PROVIDER_SITE_OTHER): Payer: BC Managed Care – PPO | Admitting: Emergency Medicine

## 2013-01-28 VITALS — BP 128/80 | HR 99 | Temp 98.0°F | Resp 17 | Ht 68.5 in | Wt 177.0 lb

## 2013-01-28 DIAGNOSIS — IMO0002 Reserved for concepts with insufficient information to code with codable children: Secondary | ICD-10-CM

## 2013-01-28 DIAGNOSIS — M25562 Pain in left knee: Secondary | ICD-10-CM

## 2013-01-28 DIAGNOSIS — M1710 Unilateral primary osteoarthritis, unspecified knee: Secondary | ICD-10-CM

## 2013-01-28 DIAGNOSIS — M1712 Unilateral primary osteoarthritis, left knee: Secondary | ICD-10-CM | POA: Insufficient documentation

## 2013-01-28 MED ORDER — MELOXICAM 15 MG PO TABS: 15.0000 mg | ORAL_TABLET | Freq: Every day | ORAL | Status: DC

## 2013-01-28 MED ORDER — HYDROCODONE-ACETAMINOPHEN 5-325 MG PO TABS: 1.0000 | ORAL_TABLET | ORAL | Status: DC | PRN

## 2013-01-28 NOTE — Patient Instructions (Addendum)
Osteoarthritis Osteoarthritis is the most common form of arthritis. It is redness, soreness, and swelling (inflammation) affecting the cartilage. Cartilage acts as a cushion, covering the ends of bones where they meet to form a joint. CAUSES  Over time, the cartilage begins to wear away. This causes bone to rub on bone. This produces pain and stiffness in the affected joints. Factors that contribute to this problem are:  Excessive body weight.  Age.  Overuse of joints. SYMPTOMS   People with osteoarthritis usually experience joint pain, swelling, or stiffness.  Over time, the joint may lose its normal shape.  Small deposits of bone (osteophytes) may grow on the edges of the joint.  Bits of bone or cartilage can break off and float inside the joint space. This may cause more pain and damage.  Osteoarthritis can lead to depression, anxiety, feelings of helplessness, and limitations on daily activities. The most commonly affected joints are in the:  Ends of the fingers.  Thumbs.  Neck.  Lower back.  Knees.  Hips. DIAGNOSIS  Diagnosis is mostly based on your symptoms and exam. Tests may be helpful, including:  X-rays of the affected joint.  A computerized magnetic scan (MRI).  Blood tests to rule out other types of arthritis.  Joint fluid tests. This involves using a needle to draw fluid from the joint and examining the fluid under a microscope. TREATMENT  Goals of treatment are to control pain, improve joint function, maintain a normal body weight, and maintain a healthy lifestyle. Treatment approaches may include:  A prescribed exercise program with rest and joint relief.  Weight control with nutritional education.  Pain relief techniques such as:  Properly applied heat and cold.  Electric pulses delivered to nerve endings under the skin (transcutaneous electrical nerve stimulation, TENS).  Massage.  Certain supplements. Ask your caregiver before using any  supplements, especially in combination with prescribed drugs.  Medicines to control pain, such as:  Acetaminophen.  Nonsteroidal anti-inflammatory drugs (NSAIDs), such as naproxen.  Narcotic or central-acting agents, such as tramadol. This drug carries a risk of addiction and is generally prescribed for short-term use.  Corticosteroids. These can be given orally or as injection. This is a short-term treatment, not recommended for routine use.  Surgery to reposition the bones and relieve pain (osteotomy) or to remove loose pieces of bone and cartilage. Joint replacement may be needed in advanced states of osteoarthritis. HOME CARE INSTRUCTIONS  Your caregiver can recommend specific types of exercise. These may include:  Strengthening exercises. These are done to strengthen the muscles that support joints affected by arthritis. They can be performed with weights or with exercise bands to add resistance.  Aerobic activities. These are exercises, such as brisk walking or low-impact aerobics, that get your heart pumping. They can help keep your lungs and circulatory system in shape.  Range-of-motion activities. These keep your joints limber.  Balance and agility exercises. These help you maintain daily living skills. Learning about your condition and being actively involved in your care will help improve the course of your osteoarthritis. SEEK MEDICAL CARE IF:   You feel hot or your skin turns red.  You develop a rash in addition to your joint pain.  You have an oral temperature above 102 F (38.9 C). FOR MORE INFORMATION  National Institute of Arthritis and Musculoskeletal and Skin Diseases: www.niams.nih.gov National Institute on Aging: www.nia.nih.gov American College of Rheumatology: www.rheumatology.org Document Released: 06/22/2005 Document Revised: 09/14/2011 Document Reviewed: 10/03/2009 [employer] Patient Information 2014 [employer].  

## 2013-01-28 NOTE — Progress Notes (Signed)
[facility] [address], [place] [address] [phone]  Date:  01/28/2013   Name:  Hector [name]   DOB:  [birth year]   MRN:  [id number]  PCP:  No PCP Per Patient    Chief Complaint: Knee Pain   History of Present Illness:  Hector [name] is a 57 y.o. very pleasant male patient who presents with the following:  History of years of pain in left knee.  Was seen in April and given mobic with little improvement. Subsequently seen by [place] ortho and recommended an injection of Euflexxa.  He seems not to remember that discussion.  Ortho put him on celebrex and he says that is not helping that the pain is disabling.  No improvement with over the counter medications or other home remedies. Denies other complaint or health concern today.   Patient Active Problem List   Diagnosis Date Noted  . Osteoarthritis of left knee 01/28/2013  . ALCOHOL ABUSE 01/04/2007  . TOBACCO ABUSE 01/04/2007  . URI 01/04/2007  . GERD 01/04/2007  . WRIST PAIN 01/04/2007  . FOOT PAIN 01/04/2007  . VOMITING 01/04/2007  . PREHYPERTENSION 01/04/2007    Past Medical History  Diagnosis Date  . Gout     L knee    Past Surgical History  Procedure Laterality Date  . Right hand fracture      History  Substance Use Topics  . Smoking status: Current Every Day Smoker -- 2.00 packs/day for 30 years    Types: Cigarettes  . Smokeless tobacco: Not on file  . Alcohol Use: 4.2 oz/week    7 Cans of beer per week    Family History  Problem Relation Age of Onset  . Cancer Mother     No Known Allergies  Medication list has been reviewed and updated.  Current Outpatient Prescriptions on File Prior to Visit  Medication Sig Dispense Refill  . allopurinol (ZYLOPRIM) 300 MG tablet Take 1 tablet (300 mg total) by mouth daily. Start with 1/2 tab daily for 10 days  90 tablet  1  . varenicline (CHANTIX CONTINUING MONTH PAK) 1 MG tablet Use this once you are done with the starter pack  60 tablet   1   No current facility-administered medications on file prior to visit.    Review of Systems:  As per HPI, otherwise negative.    Physical Examination: Filed Vitals:   01/28/13 1217  BP: 128/80  Pulse: 99  Temp: 98 F (36.7 C)  Resp: 17   Filed Vitals:   01/28/13 1217  Height: 5' 8.5" (1.74 m)  Weight: 177 lb (80.287 kg)   Body mass index is 26.52 kg/(m^2). Ideal Body Weight: Weight in (lb) to have BMI = 25: 166.5   GEN: WDWN, NAD, Non-toxic, Alert & Oriented x 3 HEENT: Atraumatic, Normocephalic.  Ears and Nose: No external deformity. EXTR: No clubbing/cyanosis/edema NEURO: Normal gait.  PSYCH: Normally interactive. Conversant. Not depressed or anxious appearing.  Calm demeanor.  LEFT knee:   Full active ROM.  No effusion pain in both medial and lateral joint line.  Assessment and Plan: Osteoarthritis left knee Follow up ortho Continue mobic Hydrocodone for the SHORT TERM only   Signed,  Phillips Odor, MD

## 2013-03-28 ENCOUNTER — Encounter (HOSPITAL_COMMUNITY): Payer: Self-pay | Admitting: Pharmacy Technician

## 2013-03-28 NOTE — H&P (Signed)
TOTAL KNEE ADMISSION H&P  Patient is being admitted for left total knee arthroplasty.  Subjective:  Chief Complaint:left knee pain.  HPI: Hector [name], 57 y.o. male, has a history of pain and functional disability in the left knee due to arthritis and has failed non-surgical conservative treatments for greater than 12 weeks to includeNSAID's and/or analgesics, flexibility and strengthening excercises and activity modification.  Onset of symptoms was gradual, starting 10 years ago with gradually worsening course since that time. The patient noted no past surgery on the left knee(s).  Patient currently rates pain in the left knee(s) at 5 out of 10 with activity. Patient has night pain, worsening of pain with activity and weight bearing, pain that interferes with activities of daily living, pain with passive range of motion, crepitus and joint swelling.  Patient has evidence of periarticular osteophytes and joint space narrowing by imaging studies.  There is no active infection.  Patient Active Problem List   Diagnosis Date Noted  . Osteoarthritis of left knee 01/28/2013  . ALCOHOL ABUSE 01/04/2007  . TOBACCO ABUSE 01/04/2007  . URI 01/04/2007  . GERD 01/04/2007  . WRIST PAIN 01/04/2007  . FOOT PAIN 01/04/2007  . VOMITING 01/04/2007  . PREHYPERTENSION 01/04/2007   Past Medical History  Diagnosis Date  . Gout     L knee    Past Surgical History  Procedure Laterality Date  . Right hand fracture     Current outpatient prescriptions: allopurinol (ZYLOPRIM) 300 MG tablet, Take 1 tablet (300 mg total) by mouth daily. Start with 1/2 tab daily for 10 days, Disp: 90 tablet, Rfl: 1;   HYDROcodone-acetaminophen (NORCO) 5-325 MG per tablet, Take 1-2 tablets by mouth every 4 (four) hours as needed for pain., Disp: 30 tablet, Rfl: 0;   meloxicam (MOBIC) 15 MG tablet, Take 1 tablet (15 mg total) by mouth daily. As needed for knee pain, Disp: 30 tablet, Rfl: 3 varenicline (CHANTIX CONTINUING MONTH  PAK) 1 MG tablet, Use this once you are done with the starter pack, Disp: 60 tablet, Rfl: 1  No Known Allergies  History  Substance Use Topics  . Smoking status: Current Every Day Smoker -- 2.00 packs/day for 30 years    Types: Cigarettes  . Smokeless tobacco: Not on file  . Alcohol Use: 4.2 oz/week    7 Cans of beer per week    Family History  Problem Relation Age of Onset  . Cancer Mother      Review of Systems  Constitutional: Negative.   HENT: Negative.  Negative for neck pain.   Eyes: Negative.   Respiratory: Negative.   Cardiovascular: Negative.   Gastrointestinal: Negative.   Genitourinary: Negative.   Musculoskeletal: Positive for joint pain. Negative for myalgias, back pain and falls.  Skin: Negative.   Neurological: Negative.   Endo/Heme/Allergies: Negative.   Psychiatric/Behavioral: Negative.     Objective:  Physical Exam  Constitutional: He is oriented to person, place, and time. He appears well-developed and well-nourished. No distress.  HENT:  Head: Normocephalic and atraumatic.  Right Ear: External ear normal.  Left Ear: External ear normal.  Nose: Nose normal.  Mouth/Throat: Oropharynx is clear and moist.  Eyes: Conjunctivae and EOM are normal.  Neck: Normal range of motion. Neck supple.  Cardiovascular: Normal rate, regular rhythm, normal heart sounds and intact distal pulses.   No murmur heard. Respiratory: Effort normal and breath sounds normal. No respiratory distress. He has no wheezes.  GI: Soft. Bowel sounds are normal. He exhibits  no distension. There is no tenderness.  Musculoskeletal:       Right hip: Normal.       Left hip: Normal.       Right knee: Normal.       Left knee: He exhibits decreased range of motion, swelling and effusion. He exhibits no erythema. Tenderness found. Medial joint line tenderness noted. No lateral joint line tenderness noted.       Right lower leg: He exhibits no tenderness and no swelling.       Left lower leg:  He exhibits no tenderness and no swelling.  Neurological: He is alert and oriented to person, place, and time. He has normal strength and normal reflexes. No sensory deficit.  Skin: No rash noted. He is not diaphoretic. No erythema.  Psychiatric: He has a normal mood and affect. His behavior is normal.   Vitals Weight: 185 lb Height: 69 in Body Surface Area: 2.02 m Body Mass Index: 27.32 kg/m Pulse: 88 (Regular) BP: 163/93 (Sitting, Left Arm, Standard)  Imaging Review Plain radiographs demonstrate severe degenerative joint disease of the left knee(s). The overall alignment ismild varus. The bone quality appears to be good for age and reported activity level.  Assessment/Plan:  End stage arthritis, left knee   The patient history, physical examination, clinical judgment of the provider and imaging studies are consistent with end stage degenerative joint disease of the left knee(s) and total knee arthroplasty is deemed medically necessary. The treatment options including medical management, injection therapy arthroscopy and arthroplasty were discussed at length. The risks and benefits of total knee arthroplasty were presented and reviewed. The risks due to aseptic loosening, infection, stiffness, patella tracking problems, thromboembolic complications and other imponderables were discussed. The patient acknowledged the explanation, agreed to proceed with the plan and consent was signed. Patient is being admitted for inpatient treatment for surgery, pain control, PT, OT, prophylactic antibiotics, VTE prophylaxis, progressive ambulation and ADL's and discharge planning. The patient is planning to be discharged home with home health services    [place], [place]

## 2013-03-29 NOTE — Patient Instructions (Signed)
Hector [name]  03/29/2013   Your procedure is scheduled on:  04/04/13              Surgery 1230pm-300pm  Report to [facility] a    0930t  AM.  Call this number if you have problems the morning of surgery: [phone]   Remember:   Do not eat food or drink liquids after midnight.   Take these medicines the morning of surgery with A SIP OF WATER:    Do not wear jewelry,  Do not wear lotions, powders, or perfumes   Men may shave face and neck.  Do not bring valuables to the hospital.  Contacts, dentures or bridgework may not be worn into surgery.  Leave suitcase in the car. After surgery it may be brought to your room.  For patients admitted to the hospital, checkout time is 11:00 AM the day of  discharge.     SEE CHG INSTRUCTION SHEET    Please read over the following fact sheets that you were given: MRSA Information, coughing and deep breathing exercises, leg exercises, Blood Transfusion Fact Sheet, Incentive Spirometry Fact sheet                Failure to comply with these instructions may result in cancellation of your surgery.                Patient Signature ____________________________              Nurse Signature _____________________________

## 2013-03-30 ENCOUNTER — Encounter (HOSPITAL_COMMUNITY): Payer: Self-pay

## 2013-03-30 ENCOUNTER — Encounter (HOSPITAL_COMMUNITY)
Admission: RE | Admit: 2013-03-30 | Discharge: 2013-03-30 | Disposition: A | Discharge status: Home / Self Care | Payer: BC Managed Care – PPO | Source: Ambulatory Visit | Attending: Orthopedic Surgery | Admitting: Orthopedic Surgery

## 2013-03-30 ENCOUNTER — Ambulatory Visit (HOSPITAL_COMMUNITY)
Admission: RE | Admit: 2013-03-30 | Discharge: 2013-03-30 | Disposition: A | Discharge status: Home / Self Care | Payer: BC Managed Care – PPO | Source: Ambulatory Visit | Attending: Surgical | Admitting: Surgical

## 2013-03-30 DIAGNOSIS — M1710 Unilateral primary osteoarthritis, unspecified knee: Secondary | ICD-10-CM | POA: Insufficient documentation

## 2013-03-30 DIAGNOSIS — Z01810 Encounter for preprocedural cardiovascular examination: Secondary | ICD-10-CM | POA: Insufficient documentation

## 2013-03-30 DIAGNOSIS — Z01812 Encounter for preprocedural laboratory examination: Secondary | ICD-10-CM | POA: Insufficient documentation

## 2013-03-30 DIAGNOSIS — Z01818 Encounter for other preprocedural examination: Secondary | ICD-10-CM | POA: Insufficient documentation

## 2013-03-30 HISTORY — DX: Gastro-esophageal reflux disease without esophagitis: K21.9

## 2013-03-30 HISTORY — DX: Unspecified osteoarthritis, unspecified site: M19.90

## 2013-03-30 LAB — CBC
HCT: 44.5 % (ref 39.0–52.0)
Hemoglobin: 14.9 g/dL (ref 13.0–17.0)
MCHC: 33.5 g/dL (ref 30.0–36.0)

## 2013-03-30 LAB — URINALYSIS, ROUTINE W REFLEX MICROSCOPIC
Bilirubin Urine: NEGATIVE
Glucose, UA: NEGATIVE mg/dL
Hgb urine dipstick: NEGATIVE
Ketones, ur: NEGATIVE mg/dL
Nitrite: NEGATIVE
Protein, ur: NEGATIVE mg/dL
Specific Gravity, Urine: 1.011 (ref 1.005–1.030)
Urobilinogen, UA: 0.2 mg/dL (ref 0.0–1.0)
pH: 6.5 (ref 5.0–8.0)

## 2013-03-30 LAB — COMPREHENSIVE METABOLIC PANEL
ALT: 25 U/L (ref 0–53)
AST: 24 U/L (ref 0–37)
Albumin: 3.9 g/dL (ref 3.5–5.2)
Alkaline Phosphatase: 134 U/L — ABNORMAL HIGH (ref 39–117)
BUN: 7 mg/dL (ref 6–23)
CO2: 26 mEq/L (ref 19–32)
Calcium: 9.6 mg/dL (ref 8.4–10.5)
Chloride: 102 mEq/L (ref 96–112)
Creatinine, Ser: 0.85 mg/dL (ref 0.50–1.35)
GFR calc Af Amer: >90 mL/min (ref >90)
GFR calc non Af Amer: >90 mL/min (ref >90)
Glucose, Bld: 97 mg/dL (ref 70–99)
Potassium: 4.2 mEq/L (ref 3.5–5.1)
Sodium: 139 mEq/L (ref 135–145)
Total Bilirubin: 0.6 mg/dL (ref 0.3–1.2)
Total Protein: 7.1 g/dL (ref 6.0–8.3)

## 2013-03-30 LAB — PROTIME-INR
INR: 0.98 (ref 0.00–1.49)
Prothrombin Time: 12.8 seconds (ref 11.6–15.2)

## 2013-03-30 LAB — URINE MICROSCOPIC-ADD ON

## 2013-03-30 LAB — APTT: aPTT: 37 seconds (ref 24–37)

## 2013-03-30 LAB — SURGICAL PCR SCREEN: Staphylococcus aureus: NEGATIVE

## 2013-04-04 ENCOUNTER — Encounter (HOSPITAL_COMMUNITY): Payer: Self-pay | Admitting: Anesthesiology

## 2013-04-04 ENCOUNTER — Encounter (HOSPITAL_COMMUNITY)
Admission: RE | Disposition: A | Discharge status: Home Health | Payer: Self-pay | Source: Ambulatory Visit | Attending: Orthopedic Surgery

## 2013-04-04 ENCOUNTER — Inpatient Hospital Stay (HOSPITAL_COMMUNITY): Payer: BC Managed Care – PPO | Admitting: Anesthesiology

## 2013-04-04 ENCOUNTER — Encounter (HOSPITAL_COMMUNITY): Payer: Self-pay | Admitting: *Deleted

## 2013-04-04 ENCOUNTER — Inpatient Hospital Stay (HOSPITAL_COMMUNITY): Payer: BC Managed Care – PPO

## 2013-04-04 ENCOUNTER — Inpatient Hospital Stay (HOSPITAL_COMMUNITY)
Admission: RE | Admit: 2013-04-04 | Discharge: 2013-04-07 | DRG: 209 | Disposition: A | Discharge status: Home Health | Payer: BC Managed Care – PPO | Source: Ambulatory Visit | Attending: Orthopedic Surgery | Admitting: Orthopedic Surgery

## 2013-04-04 DIAGNOSIS — I498 Other specified cardiac arrhythmias: Secondary | ICD-10-CM | POA: Diagnosis not present

## 2013-04-04 DIAGNOSIS — M1712 Unilateral primary osteoarthritis, left knee: Secondary | ICD-10-CM

## 2013-04-04 DIAGNOSIS — M109 Gout, unspecified: Secondary | ICD-10-CM | POA: Diagnosis not present

## 2013-04-04 DIAGNOSIS — D72829 Elevated white blood cell count, unspecified: Secondary | ICD-10-CM | POA: Diagnosis not present

## 2013-04-04 DIAGNOSIS — M79609 Pain in unspecified limb: Secondary | ICD-10-CM

## 2013-04-04 DIAGNOSIS — R000 Tachycardia, unspecified: Secondary | ICD-10-CM | POA: Diagnosis present

## 2013-04-04 DIAGNOSIS — F17200 Nicotine dependence, unspecified, uncomplicated: Secondary | ICD-10-CM | POA: Diagnosis present

## 2013-04-04 DIAGNOSIS — M24569 Contracture, unspecified knee: Secondary | ICD-10-CM | POA: Diagnosis present

## 2013-04-04 DIAGNOSIS — M1710 Unilateral primary osteoarthritis, unspecified knee: Principal | ICD-10-CM | POA: Diagnosis present

## 2013-04-04 HISTORY — PX: TOTAL KNEE ARTHROPLASTY: SHX125

## 2013-04-04 LAB — TYPE AND SCREEN
ABO/RH(D): O POS
Antibody Screen: NEGATIVE

## 2013-04-04 SURGERY — ARTHROPLASTY, KNEE, TOTAL
Anesthesia: General | Site: Knee | Laterality: Left | Wound class: Clean

## 2013-04-04 MED ORDER — PROMETHAZINE HCL 25 MG/ML IJ SOLN: 6.2500 mg | INTRAMUSCULAR | Status: DC | PRN

## 2013-04-04 MED ORDER — HYDROMORPHONE HCL PF 1 MG/ML IJ SOLN
0.2500 mg | INTRAMUSCULAR | Status: DC | PRN
  Administered 2013-04-04 (×2): 0.5 mg via INTRAVENOUS

## 2013-04-04 MED ORDER — HYDROCODONE-ACETAMINOPHEN 5-325 MG PO TABS
1.0000 | ORAL_TABLET | ORAL | Status: DC | PRN
  Administered 2013-04-04 (×2): 1 via ORAL
  Filled 2013-04-04 (×2): qty 1

## 2013-04-04 MED ORDER — ACETAMINOPHEN 650 MG RE SUPP: 650.0000 mg | Freq: Four times a day (QID) | RECTAL | Status: DC | PRN

## 2013-04-04 MED ORDER — MENTHOL 3 MG MT LOZG
1.0000 | LOZENGE | OROMUCOSAL | Status: DC | PRN
  Filled 2013-04-04: qty 9

## 2013-04-04 MED ORDER — OXYCODONE-ACETAMINOPHEN 5-325 MG PO TABS
2.0000 | ORAL_TABLET | ORAL | Status: DC | PRN
  Administered 2013-04-05 (×3): 2 via ORAL
  Filled 2013-04-04 (×3): qty 2

## 2013-04-04 MED ORDER — HYDROMORPHONE HCL PF 1 MG/ML IJ SOLN
INTRAMUSCULAR | Status: AC
  Filled 2013-04-04: qty 1

## 2013-04-04 MED ORDER — PHENOL 1.4 % MT LIQD
1.0000 | OROMUCOSAL | Status: DC | PRN
  Filled 2013-04-04: qty 177

## 2013-04-04 MED ORDER — SODIUM CHLORIDE 0.9 % IR SOLN
Status: DC | PRN
  Administered 2013-04-04: 1000 mL

## 2013-04-04 MED ORDER — ROCURONIUM BROMIDE 100 MG/10ML IV SOLN
INTRAVENOUS | Status: DC | PRN
  Administered 2013-04-04: 30 mg via INTRAVENOUS

## 2013-04-04 MED ORDER — THROMBIN 5000 UNITS EX SOLR
CUTANEOUS | Status: AC
  Filled 2013-04-04: qty 5000

## 2013-04-04 MED ORDER — BUPIVACAINE LIPOSOME 1.3 % IJ SUSP
20.0000 mL | Freq: Once | INTRAMUSCULAR | Status: DC
  Filled 2013-04-04: qty 20

## 2013-04-04 MED ORDER — SODIUM CHLORIDE 0.9 % IJ SOLN
INTRAMUSCULAR | Status: AC
  Filled 2013-04-04: qty 50

## 2013-04-04 MED ORDER — THROMBIN 5000 UNITS EX SOLR
CUTANEOUS | Status: DC | PRN
  Administered 2013-04-04: 5000 [IU] via TOPICAL

## 2013-04-04 MED ORDER — FENTANYL CITRATE 0.05 MG/ML IJ SOLN
INTRAMUSCULAR | Status: DC | PRN
  Administered 2013-04-04: 50 ug via INTRAVENOUS
  Administered 2013-04-04: 25 ug via INTRAVENOUS
  Administered 2013-04-04: 50 ug via INTRAVENOUS
  Administered 2013-04-04: 25 ug via INTRAVENOUS
  Administered 2013-04-04: 100 ug via INTRAVENOUS
  Administered 2013-04-04: 50 ug via INTRAVENOUS
  Administered 2013-04-04 (×2): 25 ug via INTRAVENOUS
  Administered 2013-04-04: 50 ug via INTRAVENOUS
  Administered 2013-04-04 (×2): 25 ug via INTRAVENOUS
  Administered 2013-04-04: 50 ug via INTRAVENOUS

## 2013-04-04 MED ORDER — ALLOPURINOL 300 MG PO TABS
300.0000 mg | ORAL_TABLET | Freq: Every day | ORAL | Status: DC
  Administered 2013-04-05 – 2013-04-07 (×3): 300 mg via ORAL
  Filled 2013-04-04 (×3): qty 1

## 2013-04-04 MED ORDER — ONDANSETRON HCL 4 MG/2ML IJ SOLN: 4.0000 mg | Freq: Four times a day (QID) | INTRAMUSCULAR | Status: DC | PRN

## 2013-04-04 MED ORDER — LACTATED RINGERS IV SOLN
INTRAVENOUS | Status: DC
  Administered 2013-04-04: 1000 mL via INTRAVENOUS
  Administered 2013-04-04: 14:00:00 via INTRAVENOUS

## 2013-04-04 MED ORDER — HYDROMORPHONE HCL PF 1 MG/ML IJ SOLN
1.0000 mg | INTRAMUSCULAR | Status: DC | PRN
Start: 2013-04-04 — End: 2013-04-07
  Administered 2013-04-04 – 2013-04-05 (×8): 1 mg via INTRAVENOUS
  Filled 2013-04-04 (×8): qty 1

## 2013-04-04 MED ORDER — RIVAROXABAN 10 MG PO TABS
10.0000 mg | ORAL_TABLET | Freq: Every day | ORAL | Status: DC
  Administered 2013-04-05 – 2013-04-07 (×3): 10 mg via ORAL
  Filled 2013-04-04 (×5): qty 1

## 2013-04-04 MED ORDER — STERILE WATER FOR IRRIGATION IR SOLN
Status: DC | PRN
  Administered 2013-04-04: 3000 mL

## 2013-04-04 MED ORDER — CHLORHEXIDINE GLUCONATE 4 % EX LIQD
60.0000 mL | Freq: Once | CUTANEOUS | Status: DC
  Filled 2013-04-04: qty 60

## 2013-04-04 MED ORDER — MIDAZOLAM HCL 5 MG/5ML IJ SOLN
INTRAMUSCULAR | Status: DC | PRN
  Administered 2013-04-04: 2 mg via INTRAVENOUS

## 2013-04-04 MED ORDER — PROPOFOL 10 MG/ML IV BOLUS
INTRAVENOUS | Status: DC | PRN
  Administered 2013-04-04: 200 mg via INTRAVENOUS

## 2013-04-04 MED ORDER — SUCCINYLCHOLINE CHLORIDE 20 MG/ML IJ SOLN
INTRAMUSCULAR | Status: DC | PRN
  Administered 2013-04-04: 100 mg via INTRAVENOUS

## 2013-04-04 MED ORDER — BISACODYL 10 MG RE SUPP: 10.0000 mg | Freq: Every day | RECTAL | Status: DC | PRN

## 2013-04-04 MED ORDER — FERROUS SULFATE 325 (65 FE) MG PO TABS
325.0000 mg | ORAL_TABLET | Freq: Three times a day (TID) | ORAL | Status: DC
  Administered 2013-04-05 – 2013-04-07 (×7): 325 mg via ORAL
  Filled 2013-04-04 (×12): qty 1

## 2013-04-04 MED ORDER — LACTATED RINGERS IV SOLN
INTRAVENOUS | Status: DC
  Administered 2013-04-04: 18:00:00 via INTRAVENOUS

## 2013-04-04 MED ORDER — METHOCARBAMOL 100 MG/ML IJ SOLN
500.0000 mg | Freq: Four times a day (QID) | INTRAVENOUS | Status: DC | PRN
  Filled 2013-04-04: qty 5

## 2013-04-04 MED ORDER — FLEET ENEMA 7-19 GM/118ML RE ENEM: 1.0000 | ENEMA | Freq: Once | RECTAL | Status: AC | PRN

## 2013-04-04 MED ORDER — ONDANSETRON HCL 4 MG/2ML IJ SOLN
INTRAMUSCULAR | Status: DC | PRN
  Administered 2013-04-04: 4 mg via INTRAVENOUS

## 2013-04-04 MED ORDER — FAMOTIDINE 10 MG PO TABS
10.0000 mg | ORAL_TABLET | Freq: Every day | ORAL | Status: DC
  Administered 2013-04-05 – 2013-04-07 (×3): 10 mg via ORAL
  Filled 2013-04-04 (×3): qty 1

## 2013-04-04 MED ORDER — BACITRACIN-NEOMYCIN-POLYMYXIN 400-5-5000 EX OINT
TOPICAL_OINTMENT | CUTANEOUS | Status: AC
  Filled 2013-04-04: qty 1

## 2013-04-04 MED ORDER — CEFAZOLIN SODIUM-DEXTROSE 2-3 GM-% IV SOLR
2.0000 g | INTRAVENOUS | Status: AC
  Administered 2013-04-04: 2 g via INTRAVENOUS

## 2013-04-04 MED ORDER — PHENYLEPHRINE HCL 10 MG/ML IJ SOLN
INTRAMUSCULAR | Status: DC | PRN
  Administered 2013-04-04: 80 ug via INTRAVENOUS

## 2013-04-04 MED ORDER — SODIUM CHLORIDE 0.9 % IJ SOLN
INTRAMUSCULAR | Status: DC | PRN
  Administered 2013-04-04: 15:00:00

## 2013-04-04 MED ORDER — SODIUM CHLORIDE 0.9 % IR SOLN
Status: DC | PRN
  Administered 2013-04-04: 13:00:00

## 2013-04-04 MED ORDER — CEFAZOLIN SODIUM-DEXTROSE 2-3 GM-% IV SOLR
INTRAVENOUS | Status: AC
  Filled 2013-04-04: qty 50

## 2013-04-04 MED ORDER — CELECOXIB 200 MG PO CAPS
200.0000 mg | ORAL_CAPSULE | Freq: Two times a day (BID) | ORAL | Status: DC
  Administered 2013-04-04 – 2013-04-07 (×5): 200 mg via ORAL
  Filled 2013-04-04 (×7): qty 1

## 2013-04-04 MED ORDER — POLYETHYLENE GLYCOL 3350 17 G PO PACK
17.0000 g | PACK | Freq: Every day | ORAL | Status: DC | PRN
  Administered 2013-04-05 (×2): 17 g via ORAL

## 2013-04-04 MED ORDER — ACETAMINOPHEN 325 MG PO TABS: 650.0000 mg | ORAL_TABLET | Freq: Four times a day (QID) | ORAL | Status: DC | PRN

## 2013-04-04 MED ORDER — ONDANSETRON HCL 4 MG PO TABS: 4.0000 mg | ORAL_TABLET | Freq: Four times a day (QID) | ORAL | Status: DC | PRN

## 2013-04-04 MED ORDER — ALUM & MAG HYDROXIDE-SIMETH 200-200-20 MG/5ML PO SUSP: 30.0000 mL | ORAL | Status: DC | PRN

## 2013-04-04 MED ORDER — CEFAZOLIN SODIUM 1-5 GM-% IV SOLN
1.0000 g | Freq: Four times a day (QID) | INTRAVENOUS | Status: AC
  Administered 2013-04-04 – 2013-04-05 (×2): 1 g via INTRAVENOUS
  Filled 2013-04-04 (×2): qty 50

## 2013-04-04 MED ORDER — METHOCARBAMOL 500 MG PO TABS
500.0000 mg | ORAL_TABLET | Freq: Four times a day (QID) | ORAL | Status: DC | PRN
  Administered 2013-04-04 – 2013-04-07 (×7): 500 mg via ORAL
  Filled 2013-04-04 (×8): qty 1

## 2013-04-04 SURGICAL SUPPLY — 67 items

## 2013-04-04 NOTE — Transfer of Care (Signed)
Immediate Anesthesia Transfer of Care Note  Patient: Hector [name]  Procedure(s) Performed: Procedure(s): LEFT TOTAL KNEE ARTHROPLASTY (Left)  Patient Location: PACU  Anesthesia Type:General  Level of Consciousness: awake, alert , oriented and patient cooperative  Airway & Oxygen Therapy: Patient Spontanous Breathing and Patient connected to face mask oxygen  Post-op Assessment: Report given to PACU RN and Post -op Vital signs reviewed and stable  Post vital signs: Reviewed and stable  Complications: No apparent anesthesia complications

## 2013-04-04 NOTE — Anesthesia Postprocedure Evaluation (Signed)
  Anesthesia Post-op Note  Patient: Hector [name]  Procedure(s) Performed: Procedure(s) (LRB): LEFT TOTAL KNEE ARTHROPLASTY (Left)  Patient Location: PACU  Anesthesia Type: General  Level of Consciousness: awake and alert   Airway and Oxygen Therapy: Patient Spontanous Breathing  Post-op Pain: mild  Post-op Assessment: Post-op Vital signs reviewed, Patient's Cardiovascular Status Stable, Respiratory Function Stable, Patent Airway and No signs of Nausea or vomiting  Last Vitals:  Filed Vitals:   04/04/13 1600  BP: 158/92  Pulse: 93  Temp:   Resp: 20    Post-op Vital Signs: stable   Complications: No apparent anesthesia complications

## 2013-04-04 NOTE — Plan of Care (Signed)
Problem: Consults Goal: Diagnosis- Total Joint Replacement Primary Total Knee     

## 2013-04-04 NOTE — Brief Op Note (Signed)
04/04/2013  3:01 PM  PATIENT:  Hector [name]  57 y.o. male  PRE-OPERATIVE DIAGNOSIS:  OA LEFT KNEE with Flexion Contracture  POST-OPERATIVE DIAGNOSIS:  OA LEFT KNEE with Flexion Contracture  PROCEDURE:  Procedure(s): LEFT TOTAL KNEE ARTHROPLASTY (Left) and Release of Flexion Contracture  SURGEON:  Surgeon(s) and Role:    * Jacki Cones, MD - Primary  PHYSICIAN ASSISTANT: Dimitri Ped PA  ASSISTANTS: Dimitri Ped PA   ANESTHESIA:   general  EBL:  Total I/O In: 1000 [I.V.:1000] Out: 225 [Urine:225]  BLOOD ADMINISTERED:none  DRAINS: (one) Hemovact drain(s) in the Left Knee with  Suction Open   LOCAL MEDICATIONS USED:  BUPIVICAINE 20cc mixed with 20cc of Normal Saline   SPECIMEN:  No Specimen  DISPOSITION OF SPECIMEN:  N/A  COUNTS:  YES  TOURNIQUET:   Total Tourniquet Time Documented: Thigh (Left) - 91 minutes Total: Thigh (Left) - 91 minutes   DICTATION: .Other Dictation: Dictation Number  [id number]  PLAN OF CARE: Admit to inpatient   PATIENT DISPOSITION:  Stable in OR   Delay start of Pharmacological VTE agent (>24hrs) due to surgical blood loss or risk of bleeding: yes

## 2013-04-04 NOTE — Anesthesia Preprocedure Evaluation (Signed)
Anesthesia Evaluation  Patient identified by MRN, date of birth, ID band Patient awake    Reviewed: Allergy & Precautions, H&P , NPO status , Patient's Chart, lab work & pertinent test results  Airway Mallampati: II TM Distance: >3 FB Neck ROM: Full    Dental no notable dental hx.    Pulmonary Current Smoker,  breath sounds clear to auscultation  Pulmonary exam normal       Cardiovascular negative cardio ROS  Rhythm:Regular Rate:Normal     Neuro/Psych negative neurological ROS  negative psych ROS   GI/Hepatic Neg liver ROS, GERD-  Medicated,  Endo/Other  negative endocrine ROS  Renal/GU negative Renal ROS  negative genitourinary   Musculoskeletal negative musculoskeletal ROS (+)   Abdominal   Peds negative pediatric ROS (+)  Hematology negative hematology ROS (+)   Anesthesia Other Findings   Reproductive/Obstetrics negative OB ROS                           Anesthesia Physical Anesthesia Plan  ASA: II  Anesthesia Plan: General   Post-op Pain Management:    Induction: Intravenous  Airway Management Planned: Oral ETT  Additional Equipment:   Intra-op Plan:   Post-operative Plan: Extubation in OR  Informed Consent: I have reviewed the patients History and Physical, chart, labs and discussed the procedure including the risks, benefits and alternatives for the proposed anesthesia with the patient or authorized representative who has indicated his/her understanding and acceptance.   Dental advisory given  Plan Discussed with: CRNA and Surgeon  Anesthesia Plan Comments:         Anesthesia Quick Evaluation  

## 2013-04-04 NOTE — Interval H&P Note (Signed)
History and Physical Interval Note:  04/04/2013 12:45 PM  Hector [name]  has presented today for surgery, with the diagnosis of OA LEFT KNEE   The various methods of treatment have been discussed with the patient and family. After consideration of risks, benefits and other options for treatment, the patient has consented to  Procedure(s): LEFT TOTAL KNEE ARTHROPLASTY (Left) as a surgical intervention .  The patient's history has been reviewed, patient examined, no change in status, stable for surgery.  I have reviewed the patient's chart and labs.  Questions were answered to the patient's satisfaction.     Hector [name] A

## 2013-04-05 ENCOUNTER — Encounter (HOSPITAL_COMMUNITY): Payer: Self-pay | Admitting: Orthopedic Surgery

## 2013-04-05 LAB — BASIC METABOLIC PANEL
BUN: 7 mg/dL (ref 6–23)
CO2: 24 mEq/L (ref 19–32)
GFR calc non Af Amer: >90 mL/min (ref >90)
Glucose, Bld: 110 mg/dL — ABNORMAL HIGH (ref 70–99)
Potassium: 3.8 mEq/L (ref 3.5–5.1)
Sodium: 135 mEq/L (ref 135–145)

## 2013-04-05 LAB — CBC
HCT: 36.2 % — ABNORMAL LOW (ref 39.0–52.0)
Hemoglobin: 12 g/dL — ABNORMAL LOW (ref 13.0–17.0)
MCHC: 33.1 g/dL (ref 30.0–36.0)
MCV: 82.1 fL (ref 78.0–100.0)
RBC: 4.41 MIL/uL (ref 4.22–5.81)

## 2013-04-05 MED ORDER — OXYCODONE HCL 5 MG PO TABS
5.0000 mg | ORAL_TABLET | ORAL | Status: DC | PRN
  Administered 2013-04-05 – 2013-04-07 (×9): 15 mg via ORAL
  Filled 2013-04-05 (×9): qty 3

## 2013-04-05 NOTE — Op Note (Signed)
NAMENAIF, ALABI NO.:  [id number]  MEDICAL RECORD NO.:  [id number]  LOCATION:  1605                         FACILITY:  [facility]  PHYSICIAN:  Georges Lynch. Lino Wickliff, M.D.DATE OF BIRTH:  September 04, 1955  DATE OF PROCEDURE:  04/04/2013 DATE OF DISCHARGE:                              OPERATIVE REPORT   SURGEON:  Georges Lynch. Kaeo Jacome, MD  OPERATIVE ASSISTANT:  Dimitri Ped, PA  PREOPERATIVE DIAGNOSES: 1. Severe flexion contracture left knee. 2. Bone-on-bone osteoarthritic left knee.  POSTOPERATIVE DIAGNOSES: 1. Severe flexion contracture left knee. 2. Bone-on-bone osteoarthritic left knee.  OPERATION:  Left total knee arthroplasty utilizing the DePuy System and gentamicin was used in the cement.  Appropriate time-out was first carried out in the operating room.  I also marked the appropriate left side of his leg in the holding area.  At this time, after sterile prep and draping was carried out, the leg was exsanguinated and Esmarch tourniquet was elevated at 300 mmHg.  Following that, an incision was made over the anterior aspect of the left leg.  Bleeders were identified and cauterized.  Two flaps were created.  I carried out a median parapatellar approach, reflected the patella laterally and flexed the knee.  I did an extensive synovectomy.  Following that, I went down and made my initial drill hole in the intercondylar notch and a guide canal finder then was inserted.  I thoroughly irrigated out the canal.  I removed 13-mm thickness off the distal femur.  At this time, we then completed our medial and lateral meniscectomies and excision of our anterior and posterior cruciate ligaments.  We then prepared the tibia. We made our initial drill hole in the tibial plateau and removed 6 mm thickness off the tibia.  Following that, we thoroughly irrigated out the area and after the initial cuts were made, we then inserted our lamina spreaders and continued a soft tissue  release posteriorly because the knee was extremely tight.  Also medially we released the medial structures.  Following that, we preserved the medial and collateral ligament obviously.  We then went on and inserted our spacer devices. We had excellent stability, excellent flexion and extension with 12.5 mm thickness insert.  We then went on to continue to prepare the tibia.  We made our keel cut out the tibia in usual fashion.  We then cut our notch cut out of the distal femur.  We water picked out the knee, and then inserted our trial components.  We utilized a size 6.  We measured for a size 5 femur.  Note, we made our initial chamfering cuts obviously prior to doing the work on the tibia left posterior cruciate sacrificing.  The cuts were all made appropriately for a size 5 femur.  After that we then completed the remaining part of the tibia as I mentioned and our notch cut out of the distal femur following that.  After the appropriate irrigation, we then went on and inserted our trial components.  We then had a good fit with the 12.5 mm thickness tibial insert.  We then did a resurfacing procedure on the patella.  Three drill holes were made in the  patella for a size 38 patella.  All trial components were removed. We water picked the knee and removed all loose pieces of bone fragments. We then inserted our tibial component which was cemented in place. Gentamicin was used in the cement.  Following that, we cemented our femoral component in usual fashion.  We then inserted our trial tibia component and put the knee in extension and then cemented in our patella.  After the cement was hardened, we removed all loose pieces of cement.  I removed the trial tibial insert, injected half of our mixture of 20 mL of Exparel with 20 mL of normal saline into the soft tissue structures.  We then made sure there was no other abnormalities noted and we inserted our permanent tibial component, a rotating  platform component which was 12.5 mm thickness size 5.  The tibial tray was measured to be a size 5 as well as the insert.  We reduced the knee, had an excellent flexion and extension and good stability.  The patella tracked well.  We then inserted our Hemovac drain and closed the knee in layers in usual fashion.  Sterile dressings were applied.          ______________________________ Georges Lynch Darrelyn Hillock, M.D.     RAG/MEDQ  D:  04/04/2013  T:  04/05/2013  Job:  [id number]

## 2013-04-05 NOTE — Progress Notes (Signed)
Physical Therapy Treatment Patient Details Name: Hector [name] MRN: [id number] DOB: 1956/01/09 Today's Date: 04/05/2013 Time: 7829-5621 PT Time Calculation (min): 23 min  PT Assessment / Plan / Recommendation  History of Present Illness 57 yo male s/p L TKA 9/30   PT Comments   Progressing slowly with mobility. Mobility remains limited by pain although pt was able to ambulate further and attempt a few ROM exercises. Plan is for d/c home on tomorrow. Will need to practice steps.   Follow Up Recommendations  Home health PT     Does the patient have the potential to tolerate intense rehabilitation     Barriers to Discharge        Equipment Recommendations  Rolling walker with 5" wheels    Recommendations for Other Services OT consult  Frequency 7X/week   Progress towards PT Goals Progress towards PT goals: Progressing toward goals  Plan Current plan remains appropriate    Precautions / Restrictions Precautions Precautions: Knee Required Braces or Orthoses: Knee Immobilizer - Left Knee Immobilizer - Left: Discontinue once straight leg raise with < 10 degree lag Restrictions Weight Bearing Restrictions: No LLE Weight Bearing: Weight bearing as tolerated   Pertinent Vitals/Pain 7/10 L knee with activity. Ice applied end of session    Mobility  Bed Mobility Bed Mobility: Supine to Sit;Sit to Supine Supine to Sit: 4: Min assist Sit to Supine: 4: Min assist Details for Bed Mobility Assistance: Assist for L LE.  Transfers Transfers: Sit to Stand;Stand to Sit Sit to Stand: 4: Min guard;From bed Stand to Sit: 4: Min guard;To bed Details for Transfer Assistance: VCS safety, technique, hand placement. Ambulation/Gait Ambulation/Gait Assistance: 4: Min guard Ambulation Distance (Feet): 75 Feet Assistive device: Rolling walker Ambulation/Gait Assistance Details: VCS safety, technique, sequence, step length. Pt able to progress to putting some weight through L LE. Educated pt on  step to pattern vs step through pattern.  Gait Pattern: Step-to pattern;Step-through pattern;Decreased stride length;Decreased step length - left;Antalgic;Trunk flexed    Exercises Total Joint Exercises Ankle Circles/Pumps: AROM;Both;10 reps;Supine Quad Sets: AROM;Both;10 reps;Supine Heel Slides: AAROM;Left;5 reps;Supine Straight Leg Raises: AAROM;Left;5 reps;Supine Goniometric ROM: 15-35 degrees supine. Limited due to pain.    PT Diagnosis: Difficulty walking;Acute pain;Abnormality of gait  PT Problem List: Decreased strength;Decreased range of motion;Decreased activity tolerance;Decreased mobility;Pain;Decreased knowledge of use of DME PT Treatment Interventions: DME instruction;Gait training;Stair training;Functional mobility training;Therapeutic activities;Therapeutic exercise;Patient/family education   PT Goals (current goals can now be found in the care plan section) Acute Rehab PT Goals Patient Stated Goal: To go home tomorrow PT Goal Formulation: With patient Time For Goal Achievement: 04/12/13 Potential to Achieve Goals: Good  Visit Information  Last PT Received On: 04/05/13 Assistance Needed: +1 History of Present Illness: 57 yo male s/p L TKA 9/30    Subjective Data  Patient Stated Goal: To go home tomorrow   Cognition  Cognition Arousal/Alertness: Awake/alert Behavior During Therapy: WFL for tasks assessed/performed Overall Cognitive Status: Within Functional Limits for tasks assessed    Balance     End of Session PT - End of Session Activity Tolerance: Patient limited by pain Patient left: in bed;with call bell/phone within reach   GP     Rebeca Alert, MPT Pager: [phone]

## 2013-04-05 NOTE — Care Management Note (Addendum)
    Page 1 of 2   04/07/2013     2:48:44 PM   CARE MANAGEMENT NOTE 04/07/2013  Patient:  Hector [name], Hector [name]   Account Number:  [id number]  Date Initiated:  04/05/2013  Documentation initiated by:  Colleen Can  Subjective/Objective Assessment:   dx OA left knee, flexion contracture; total knee replacemnt and release of contracture     Action/Plan:   CM spoke with patient. Plans are for spouse and daughter to be caregivers.   Anticipated DC Date:  04/07/2013   Anticipated DC Plan:  HOME W HOME HEALTH SERVICES      DC Planning Services  CM consult      PAC Choice  DURABLE MEDICAL EQUIPMENT  HOME HEALTH   Choice offered to / List presented to:  C-1 Patient   DME arranged  3-N-1  Levan Hurst      DME agency  [facility]     HH arranged  HH-2 PT      [facility] agency  [facility]   Status of service:  Completed, signed off Medicare Important Message given?   (If response is "NO", the following Medicare IM given date fields will be blank) Date Medicare IM given:   Date Additional Medicare IM given:    Discharge Disposition:  HOME W HOME HEALTH SERVICES  Per UR Regulation:  Reviewed for med. necessity/level of care/duration of stay  If discussed at Long Length of Stay Meetings, dates discussed:    Comments:  04/07/2013 Colleen Can BSN RN CCM [phone] Pt discharged today with home health services in place. Genevieve Norlander will start services 04/07/2013.

## 2013-04-05 NOTE — Evaluation (Signed)
Occupational Therapy Evaluation Patient Details Name: Hector [name] MRN: [id number] DOB: 12/21/1955 Today's Date: 04/05/2013 Time: 4098-1191 OT Time Calculation (min): 42 min  OT Assessment / Plan / Recommendation History of present illness Pt with L TKA, flexion contracture release   Clinical Impression   Pt demos decline in function and safety with ADLs and ADL mobility and would benefit from acute OT services to address impairments to help restore PLOF to return home safely. Pain limiting mobility this morning since Hemovac was removed per pt    OT Assessment  Patient needs continued OT Services    Follow Up Recommendations  Supervision/Assistance - 24 hour;Home health OT    Barriers to Discharge   None  Equipment Recommendations  Tub/shower bench    Recommendations for Other Services    Frequency  Min 2X/week    Precautions / Restrictions Precautions Precautions: Knee Required Braces or Orthoses: Knee Immobilizer - Left Knee Immobilizer - Left: On when out of bed or walking Restrictions Weight Bearing Restrictions: Yes LLE Weight Bearing: Weight bearing as tolerated   Pertinent Vitals/Pain "20"/10 per pt. Pain meds given     ADL  Grooming: Performed;Wash/dry hands;Wash/dry face;Min guard Where Assessed - Grooming: Supported standing Upper Body Bathing: Simulated;Supervision/safety;Set up Lower Body Bathing: Maximal assistance Upper Body Dressing: Performed;Set up;Supervision/safety Lower Body Dressing: Maximal assistance Toilet Transfer: Performed;Min guard Toilet Transfer Method: Sit to stand Toilet Transfer Equipment: Grab bars;Raised toilet seat with arms (or 3-in-1 over toilet) Toileting - Clothing Manipulation and Hygiene: Minimal assistance Where Assessed - Toileting Clothing Manipulation and Hygiene: Standing Tub/Shower Transfer Method: Not assessed Equipment Used: Knee Immobilizer;Sock aid;Rolling walker;Gait belt;Reacher;Long-handled sponge;Long-handled  shoe horn Transfers/Ambulation Related to ADLs: cues for hand placement ADL Comments: Pt provided with education and demo of ADL A/E, educated on use of tub bench with picture provided    OT Diagnosis: Acute pain  OT Problem List: Decreased knowledge of use of DME or AE;Decreased activity tolerance;Impaired balance (sitting and/or standing);Pain OT Treatment Interventions: Self-care/ADL training;Patient/family education;Balance training;Therapeutic activities;DME and/or AE instruction;Neuromuscular education   OT Goals(Current goals can be found in the care plan section) Acute Rehab OT Goals Patient Stated Goal: To go home tomorrow OT Goal Formulation: With patient Time For Goal Achievement: 04/12/13 Potential to Achieve Goals: Good ADL Goals Pt Will Perform Grooming: with set-up;with supervision;standing Pt Will Perform Lower Body Bathing: with mod assist;with min assist;with adaptive equipment Pt Will Perform Lower Body Dressing: with mod assist;with min assist;with adaptive equipment Pt Will Transfer to Toilet: with supervision;grab bars Pt Will Perform Toileting - Clothing Manipulation and hygiene: with min guard assist;sit to/from stand Pt Will Perform Tub/Shower Transfer: with min assist;with min guard assist;tub bench  Visit Information  Last OT Received On: 04/05/13 Assistance Needed: +1 History of Present Illness: Pt with L TKA, flexion contracture release       Prior Functioning     Home Living Family/patient expects to be discharged to:: Private residence Living Arrangements: Spouse/significant other Type of Home: House Home Access: Stairs to enter Secretary/administrator of Steps: 3 Entrance Stairs-Rails: Left Home Layout: One level Home Equipment: Crutches Prior Function Level of Independence: Independent Communication Communication: No difficulties Dominant Hand: Left         Vision/Perception Vision - History Baseline Vision: Wears glasses only for  reading Patient Visual Report: No change from baseline Perception Perception: Within Functional Limits   Cognition  Cognition Arousal/Alertness: Awake/alert Behavior During Therapy: WFL for tasks assessed/performed Overall Cognitive Status: Within Functional Limits for tasks assessed  Extremity/Trunk Assessment Upper Extremity Assessment Upper Extremity Assessment: Overall WFL for tasks assessed Lower Extremity Assessment Lower Extremity Assessment: Defer to PT evaluation Cervical / Trunk Assessment Cervical / Trunk Assessment: Normal     Mobility Bed Mobility Bed Mobility: Supine to Sit;Sitting - Scoot to Edge of Bed Supine to Sit: 4: Min assist Sitting - Scoot to [place] of Bed: 4: Min assist Details for Bed Mobility Assistance: min A wiht L LE Transfers Transfers: Sit to Stand;Stand to Sit Sit to Stand: 4: Min guard;From bed;From chair/3-in-1 Stand to Sit: To chair/3-in-1 Details for Transfer Assistance: cues for hand placement     Exercise     Balance Balance Balance Assessed: Yes Dynamic Standing Balance Dynamic Standing - Balance Support: During functional activity Dynamic Standing - Level of Assistance: 4: Min assist;5: Stand by assistance   End of Session OT - End of Session Equipment Utilized During Treatment: Rolling walker;Right knee immobilizer;Gait belt;Other (comment) (3 in 1 over toilet, ADL A/E) Activity Tolerance: Patient limited by pain Patient left: in bed;with call bell/phone within reach  GO     Galen Manila 04/05/2013, 10:22 AM

## 2013-04-05 NOTE — Evaluation (Addendum)
Physical Therapy Evaluation Patient Details Name: Hector [name] MRN: [id number] DOB: August 18, 1955 Today's Date: 04/05/2013 Time: 1000-1014 PT Time Calculation (min): 14 min  PT Assessment / Plan / Recommendation History of Present Illness  57 yo male s/p L TKA 9/30  Clinical Impression  On eval, pt required Min assist for mobility-only able/agreeable to ambulate short distance in room ~15 feet with RW. Mobility is significantly limited by pain. Recommend HHPT. Pt has been using crutches for ambulation prior to surgery.     PT Assessment  Patient needs continued PT services    Follow Up Recommendations  Home health PT    Does the patient have the potential to tolerate intense rehabilitation      Barriers to Discharge        Equipment Recommendations  RW if pt decides he wants one. Has been using crutches in past.    Recommendations for Other Services OT consult   Frequency 7X/week    Precautions / Restrictions Precautions Precautions: Knee Required Braces or Orthoses: Knee Immobilizer - Left Knee Immobilizer - Left: Discontinue once straight leg raise with < 10 degree lag Restrictions Weight Bearing Restrictions: No LLE Weight Bearing: Weight bearing as tolerated   Pertinent Vitals/Pain "20" L knee at rest (pt reports since drain removed this am). Pre-medicated prior to session. Ice applied end of session.       Mobility  Bed Mobility Bed Mobility: Supine to Sit;Sit to Supine Supine to Sit: 4: Min assist Sitting - Scoot to Edge of Bed: 4: Min assist Sit to Supine: 4: Min assist Details for Bed Mobility Assistance: Assist for L LE.  Transfers Transfers: Sit to Stand;Stand to Sit Sit to Stand: 4: Min guard;From elevated surface;From bed Stand to Sit: To bed;4: Min guard Details for Transfer Assistance: VCS safety, technique, hand placement. Ambulation/Gait Ambulation/Gait Assistance: 4: Min guard Ambulation Distance (Feet): 15 Feet (x 2) Assistive device: Rolling  walker Ambulation/Gait Assistance Details: VCs safety, technique. Pt using NWB L LE technique due to pain. Encouraged weightbearing through L LE.  Gait Pattern: Step-to pattern;Antalgic    Exercises     PT Diagnosis: Difficulty walking;Acute pain;Abnormality of gait  PT Problem List: Decreased strength;Decreased range of motion;Decreased activity tolerance;Decreased mobility;Pain;Decreased knowledge of use of DME PT Treatment Interventions: DME instruction;Gait training;Stair training;Functional mobility training;Therapeutic activities;Therapeutic exercise;Patient/family education     PT Goals(Current goals can be found in the care plan section) Acute Rehab PT Goals Patient Stated Goal: To go home tomorrow PT Goal Formulation: With patient Time For Goal Achievement: 04/12/13 Potential to Achieve Goals: Good  Visit Information  Last PT Received On: 04/05/13 Assistance Needed: +1 History of Present Illness: 57 yo male s/p L TKA 9/30       Prior Functioning  Home Living Family/patient expects to be discharged to:: Private residence Living Arrangements: Spouse/significant other Type of Home: House Home Access: Stairs to enter Secretary/administrator of Steps: 3 Entrance Stairs-Rails: Left Home Layout: One level Home Equipment: Crutches Prior Function Level of Independence: Independent Communication Communication: No difficulties Dominant Hand: Left    Cognition  Cognition Arousal/Alertness: Awake/alert Behavior During Therapy: WFL for tasks assessed/performed Overall Cognitive Status: Within Functional Limits for tasks assessed    Extremity/Trunk Assessment Upper Extremity Assessment Upper Extremity Assessment: Overall WFL for tasks assessed Lower Extremity Assessment Lower Extremity Assessment: LLE deficits/detail LLE: Unable to fully assess due to pain Cervical / Trunk Assessment Cervical / Trunk Assessment: Normal   Balance Balance Balance Assessed: Yes Dynamic  Standing Balance Dynamic  Standing - Balance Support: During functional activity Dynamic Standing - Level of Assistance: 4: Min assist;5: Stand by assistance  End of Session PT - End of Session Activity Tolerance: Patient limited by pain Patient left: in bed;with call bell/phone within reach  GP     Rebeca Alert, MPT Pager: [phone]

## 2013-04-05 NOTE — Progress Notes (Signed)
[facility]  [facility] is providing the following services: RW and Commode  If patient discharges after hours, please call [phone].   Hector [name] 04/05/2013, 11:07 AM

## 2013-04-05 NOTE — Progress Notes (Signed)
Subjective: 1 Day Post-Op Procedure(s) (LRB): LEFT TOTAL KNEE ARTHROPLASTY (Left) Patient reports pain as 2 on 0-10 scale. Doing very well.Hemovac Dcd. Will DC tomorrow.  Objective: Vital signs in last 24 hours: Temp:  [97.6 F (36.4 C)-98.9 F (37.2 C)] 98.8 F (37.1 C) (10/01 0554) Pulse Rate:  [77-108] 95 (10/01 0554) Resp:  [16-20] 16 (10/01 0554) BP: (129-172)/(53-97) 129/76 mmHg (10/01 0554) SpO2:  [92 %-100 %] 93 % (10/01 0554) Weight:  [80.287 kg (177 lb)] 80.287 kg (177 lb) (09/30 1342)  Intake/Output from previous day: 09/30 0701 - 10/01 0700 In: 2810 [P.O.:420; I.V.:2390] Out: 2755 [Urine:2575; Drains:80; Blood:100] Intake/Output this shift:     Recent Labs  04/05/13 0420  HGB 12.0*    Recent Labs  04/05/13 0420  WBC 10.6*  RBC 4.41  HCT 36.2*  PLT 296    Recent Labs  04/05/13 0420  NA 135  K 3.8  CL 102  CO2 24  BUN 7  CREATININE 0.89  GLUCOSE 110*  CALCIUM 8.9   No results found for this basename: LABPT, INR,  in the last 72 hours  Dorsiflexion/Plantar flexion intact No cellulitis present Compartment soft  Assessment/Plan: 1 Day Post-Op Procedure(s) (LRB): LEFT TOTAL KNEE ARTHROPLASTY (Left) Up with therapy discontinued Plans for tomorrow.  Monifah Freehling A 04/05/2013, 8:31 AM

## 2013-04-06 ENCOUNTER — Inpatient Hospital Stay (HOSPITAL_COMMUNITY): Payer: BC Managed Care – PPO

## 2013-04-06 DIAGNOSIS — M1990 Unspecified osteoarthritis, unspecified site: Secondary | ICD-10-CM

## 2013-04-06 DIAGNOSIS — R000 Tachycardia, unspecified: Secondary | ICD-10-CM

## 2013-04-06 LAB — CBC
HCT: 35.4 % — ABNORMAL LOW (ref 39.0–52.0)
Hemoglobin: 12.2 g/dL — ABNORMAL LOW (ref 13.0–17.0)
MCHC: 34.5 g/dL (ref 30.0–36.0)
MCV: 81.4 fL (ref 78.0–100.0)
RDW: 13.9 % (ref 11.5–15.5)

## 2013-04-06 LAB — BASIC METABOLIC PANEL
BUN: 5 mg/dL — ABNORMAL LOW (ref 6–23)
Chloride: 101 mEq/L (ref 96–112)
Creatinine, Ser: 0.88 mg/dL (ref 0.50–1.35)
GFR calc Af Amer: >90 mL/min (ref >90)
GFR calc non Af Amer: >90 mL/min (ref >90)
Glucose, Bld: 129 mg/dL — ABNORMAL HIGH (ref 70–99)
Sodium: 138 mEq/L (ref 135–145)

## 2013-04-06 LAB — PROCALCITONIN: Procalcitonin: 0.46 ng/mL

## 2013-04-06 LAB — D-DIMER, QUANTITATIVE (NOT AT ARMC): D-Dimer, Quant: 1.54 ug/mL-FEU — ABNORMAL HIGH (ref 0.00–0.48)

## 2013-04-06 MED ORDER — METHOCARBAMOL 500 MG PO TABS: 500.0000 mg | ORAL_TABLET | Freq: Four times a day (QID) | ORAL | Status: DC | PRN

## 2013-04-06 MED ORDER — OXYCODONE HCL 5 MG PO TABS: 5.0000 mg | ORAL_TABLET | ORAL | Status: DC | PRN

## 2013-04-06 MED ORDER — COLCHICINE 0.6 MG PO TABS
0.6000 mg | ORAL_TABLET | Freq: Two times a day (BID) | ORAL | Status: DC
  Administered 2013-04-06 – 2013-04-07 (×3): 0.6 mg via ORAL
  Filled 2013-04-06 (×4): qty 1

## 2013-04-06 MED ORDER — RIVAROXABAN 10 MG PO TABS: 10.0000 mg | ORAL_TABLET | Freq: Every day | ORAL | Status: DC

## 2013-04-06 MED ORDER — IOHEXOL 350 MG/ML SOLN
100.0000 mL | Freq: Once | INTRAVENOUS | Status: AC | PRN
  Administered 2013-04-06: 100 mL via INTRAVENOUS

## 2013-04-06 MED ORDER — COLCHICINE 0.6 MG PO TABS
0.6000 mg | ORAL_TABLET | Freq: Once | ORAL | Status: AC
  Administered 2013-04-06: 0.6 mg via ORAL
  Filled 2013-04-06: qty 1

## 2013-04-06 NOTE — Progress Notes (Signed)
*  Preliminary Results* Left lower extremity venous duplex completed. Left lower extremity is negative for deep vein thrombosis. There is no evidence of left Baker's cyst.  04/06/2013 10:09 AM  Gertie Fey, RVT, RDCS, RDMS

## 2013-04-06 NOTE — Progress Notes (Signed)
Occupational Therapy Treatment Patient Details Name: Hector [name] MRN: [id number] DOB: 05/03/1956 Today's Date: 04/06/2013 Time: 4098-1191 OT Time Calculation (min): 10 min  OT Assessment / Plan / Recommendation  History of present illness 57 yo male s/p L TKA 9/30      Follow Up Recommendations  Supervision/Assistance - 24 hour;Home health OT       Equipment Recommendations  Tub/shower bench       Frequency Min 2X/week         Precautions / Restrictions Precautions Precautions: Knee Precaution Comments: Instructed pt on KI use for amb Required Braces or Orthoses: Knee Immobilizer - Left Knee Immobilizer - Left: Discontinue once straight leg raise with < 10 degree lag Restrictions Weight Bearing Restrictions: No LLE Weight Bearing: Weight bearing as tolerated       ADL  Toilet Transfer: Supervision/safety Toilet Transfer Method: Sit to Barista: Comfort height toilet Toileting - Clothing Manipulation and Hygiene: Supervision/safety Where Assessed - Toileting Clothing Manipulation and Hygiene: Standing Transfers/Ambulation Related to ADLs: cues for hand placement      OT Goals(current goals can now be found in the care plan section)    Visit Information  Last OT Received On: 04/06/13 Assistance Needed: +1 History of Present Illness: 57 yo male s/p L TKA 9/30          Cognition  Cognition Arousal/Alertness: Awake/alert Behavior During Therapy: WFL for tasks assessed/performed Overall Cognitive Status: Within Functional Limits for tasks assessed    Mobility  Bed Mobility Bed Mobility: Supine to Sit Supine to Sit: 5: Supervision;HOB flat Details for Bed Mobility Assistance: Assist for L LE and increased time due to c/o 9/10 pain Transfers Transfers: Sit to Stand;Stand to Sit Sit to Stand: From bed;From toilet Stand to Sit: To toilet;To bed Details for Transfer Assistance: VCS safety, technique, hand placement           End of  Session OT - End of Session Equipment Utilized During Treatment: Rolling walker;Right knee immobilizer;Gait belt;Other (comment) (3 in 1 over toilet, ADL A/E) Activity Tolerance: Patient limited by pain Patient left: in bed;with call bell/phone within reach  GO     Dorma Altman, [place]ung 04/06/2013, 1:00 PM

## 2013-04-06 NOTE — Progress Notes (Signed)
Subjective: 2 Days Post-Op Procedure(s) (LRB): LEFT TOTAL KNEE ARTHROPLASTY (Left) Patient reports pain as 1 on 0-10 scale.He developed a Tachycardia during the night. EKG reveals a Sinus Tachycardia   . No Chest pain or Shortness of breath. He is Afebrile and has some swelling in his Left Calf which most likely is from Post-OP bleeding. Will do Doppler of Left Leg and get a Chest Xray. He may be developing small Pulmonary Emboli even though he does not have any chest pain. Vital signs in last 24 hours: Temp:  [97.6 F (36.4 C)-99.4 F (37.4 C)] 98.9 F (37.2 C) (10/02 0449) Pulse Rate:  [89-135] 121 (10/02 0539) Resp:  [16-18] 16 (10/02 0449) BP: (140-157)/(80-98) 155/85 mmHg (10/02 0449) SpO2:  [76 %-97 %] 97 % (10/02 0539)  Intake/Output from previous day:Hbg is stable 10/01 0701 - 10/02 0700 In: 1828.3 [P.O.:720; I.V.:1108.3] Out: 7225 [Urine:7225] Intake/Output this shift:     Recent Labs  04/05/13 0420 04/06/13 0437  HGB 12.0* 12.2*    Recent Labs  04/05/13 0420 04/06/13 0437  WBC 10.6* 17.3*  RBC 4.41 4.35  HCT 36.2* 35.4*  PLT 296 355    Recent Labs  04/05/13 0420 04/06/13 0437  NA 135 138  K 3.8 4.0  CL 102 101  CO2 24 26  BUN 7 5*  CREATININE 0.89 0.88  GLUCOSE 110* 129*  CALCIUM 8.9 9.2   No results found for this basename: LABPT, INR,  in the last 72 hours  Dorsiflexion/Plantar flexion intact  Assessment/Plan: 2 Days Post-Op Procedure(s) (LRB): LEFT TOTAL KNEE ARTHROPLASTY (Left) Up with therapy. Hospitalist consult to evaluate his sudden onset of Tachycardia.  Dulce Martian A 04/06/2013, 7:22 AM

## 2013-04-06 NOTE — Progress Notes (Signed)
Physical Therapy Treatment Patient Details Name: Hector [name] MRN: [id number] DOB: 02-23-1956 Today's Date: 04/06/2013 Time: 6644-0347 PT Time Calculation (min): 24 min  PT Assessment / Plan / Recommendation  History of Present Illness 57 yo male s/p L TKA 9/30   PT Comments   POD # 2 pm session.  Assisted pt OOB to amb in hallway then positioned back in bed per pt request.  Pt requires increased time.  Very slow gait with increased c/o L Knee pain which pt believes to be Gout.  ICE applied.   Follow Up Recommendations  Home health PT     Does the patient have the potential to tolerate intense rehabilitation     Barriers to Discharge        Equipment Recommendations  Rolling walker with 5" wheels    Recommendations for Other Services    Frequency 7X/week   Progress towards PT Goals Progress towards PT goals: Progressing toward goals  Plan Current plan remains appropriate    Precautions / Restrictions Precautions Precautions: Knee Precaution Comments: Instructed pt on KI use for amb Required Braces or Orthoses: Knee Immobilizer - Left Knee Immobilizer - Left: Discontinue once straight leg raise with < 10 degree lag Restrictions Weight Bearing Restrictions: No LLE Weight Bearing: Weight bearing as tolerated    Pertinent Vitals/Pain C/o 8/10 pain ICE applied    Mobility  Bed Mobility Bed Mobility: Supine to Sit;Sit to Supine Supine to Sit: 5: Supervision Sitting - Scoot to Edge of Bed: 5: Supervision Details for Bed Mobility Assistance: increased time and pt uses B UE's to support L LE due to MAX c/o pain Transfers Transfers: Sit to Stand;Stand to Sit Sit to Stand: 4: Min guard;From bed Stand to Sit: 4: Min guard;To bed Details for Transfer Assistance: increased time and < 25% VC's safety with stand to sit Ambulation/Gait Ambulation/Gait Assistance: 4: Min guard Ambulation Distance (Feet): 125 Feet Assistive device: Rolling walker Ambulation/Gait Assistance  Details: increased time and <25% VC's on safety with turns Gait Pattern: Step-to pattern;Step-through pattern;Decreased stride length;Decreased step length - left;Antalgic;Trunk flexed Gait velocity: decreased    Exercises  Pt uanble to tolerate    PT Goals (current goals can now be found in the care plan section)    Visit Information  Last PT Received On: 04/06/13 Assistance Needed: +1 History of Present Illness: 57 yo male s/p L TKA 9/30    Subjective Data      Cognition  Cognition Arousal/Alertness: Awake/alert Behavior During Therapy: WFL for tasks assessed/performed Overall Cognitive Status: Within Functional Limits for tasks assessed    Balance     End of Session PT - End of Session Equipment Utilized During Treatment: Gait belt Activity Tolerance: Patient limited by pain Patient left: in bed   Felecia Shelling  PTA [facility]  Acute  Rehab Pager      [phone]

## 2013-04-06 NOTE — Consult Note (Signed)
[facility] Medical Consultation  Hector [name] JXB:[id number] DOB: Jun 21, 1956 DOA: 04/04/2013 PCP: No PCP Per Patient   Requesting physician: Dr Darrelyn Hillock Date of consultation: 10/2 Reason for consultation: tachycardia, shortness of breath.   Impression/Recommendations Active Problems:  1. Tachycardia: possibly related to left knee pain. EKG shows sinus tachycardia. He denies any chest pain or palpitations.  He is afebrile.   2. Sob, hypoxia: possibly from a combination of atelectasis and a component of copd. He was initially worked up for PE AND DVT, venous duplex and CT angio of the chest came back negative.   3. Left knee pain and swelling: worse than yesterday. He has a h/o gout and feels that the pain is similiar to a gout flare . It could also be from post bleeding. We have started the patient on colchicine. Resume allopurinol and celebrex.   4. Leukocytosis: probably from stress demargination. Pro calcitonin levels ordered. CXR does not show any pneumonia and UA does not show infection.   5. Left knee replacement: further management as per ortho.  6. DVT prophylaxis.   I will followup again tomorrow. Please contact me if I can be of assistance in the meanwhile. Thank you for this consultation.    HPI: 57 year old gentleman with h/o left knee pain due to arthritis, was admitted for TKR on the left knee. He underwent the TKR, on 10/1. On the morning of 10/2 , he became tachycardic, sob, hypoxic. We were consulted for further evaluation.    Review of Systems:  Constitutional: Negative for fever, chills and malaise/fatigue. Negative for diaphoresis.  HENT: Negative for hearing loss, ear pain, nosebleeds, congestion, sore throat, neck pain, tinnitus and ear discharge.  Eyes: Negative for blurred vision, double vision, photophobia, pain, discharge and redness.  Respiratory: neg for cough, , no wheezing and stridor.  Cardiovascular: Negative for chest pain, palpitations,  orthopnea, claudication Gastrointestinal: Negative for nausea, vomiting and abdominal pain. Negative for heartburn, constipation, blood in stool and melena.  Genitourinary: Negative for dysuria, urgency, frequency, hematuria and flank pain.  Musculoskeletal: Negative for myalgias, back pain, Skin: Negative for itching and rash.  Neurological: Negative for dizziness and weakness. Negative for tingling, tremors, sensory change, speech change, focal weakness, loss of consciousness and headaches.  Endo/Heme/Allergies: Negative for environmental allergies and polydipsia. Does not bruise/bleed easily.    Past Medical History  Diagnosis Date  . Gout     L knee  . GERD (gastroesophageal reflux disease)   . Arthritis    Past Surgical History  Procedure Laterality Date  . Right hand fracture    . Facial cosmetic surgery      due ot shattered bones   . Total knee arthroplasty Left 04/04/2013    Procedure: LEFT TOTAL KNEE ARTHROPLASTY;  Surgeon: Jacki Cones, MD;  Location: [facility];  Service: Orthopedics;  Laterality: Left;   Social History:  reports that he has been smoking Cigarettes.  He has a 58.5 pack-year smoking history. He has never used smokeless tobacco. He reports that he drinks about 7.2 ounces of alcohol per week. He reports that he does not use illicit drugs.  No Known Allergies Family History  Problem Relation Age of Onset  . Cancer Mother     Prior to Admission medications   Medication Sig Start Date End Date Taking? Authorizing Provider  allopurinol (ZYLOPRIM) 300 MG tablet Take 300 mg by mouth daily.   Yes Historical Provider, MD  ranitidine (ZANTAC) 150 MG tablet Take 150 mg by  mouth 2 (two) times daily as needed for heartburn (for acid reflux).   Yes Historical Provider, MD   Physical Exam: Blood pressure 165/87, pulse 115, temperature 98.3 F (36.8 C), temperature source Oral, resp. rate 16, height 5\' 9"  (1.753 m), weight 80.287 kg (177 lb), SpO2 98.00%. Filed  Vitals:   04/06/13 1033  BP: 165/87  Pulse: 115  Temp: 98.3 F (36.8 C)  Resp: 16    Constitutional: Vital signs reviewed.  Patient is a well-developed and well-nourished  in no acute distress and cooperative with exam. Alert and oriented x3.  Head: Normocephalic and atraumatic Mouth: no erythema or exudates, MMM Eyes: PERRL, EOMI, conjunctivae normal, No scleral icterus.  Neck: Supple, Trachea midline normal ROM, No JVD, mass, thyromegaly, or carotid bruit present.  Cardiovascular: RRR, S1 normal, S2 normal, no MRG, pulses symmetric and intact bilaterally Pulmonary/Chest: normal respiratory effort, CTAB, no wheezes, rales, or rhonchi Abdominal: Soft. Non-tender, non-distended, bowel sounds are normal, no masses, organomegaly, or guarding present.  Musculoskeletal: left knee swelling and tenderness.  Neurological: A&O x3, Strength is normal and symmetric bilaterally, cranial nerve II-XII are grossly intact, no focal motor deficit, sensory intact to light touch bilaterally.  Skin: Warm, dry and intact. No rash, cyanosis, or clubbing.  Psychiatric: Normal mood and affect. speech and behavior is normal.     Labs on Admission:  Basic Metabolic Panel:  Recent Labs Lab 04/05/13 0420 04/06/13 0437  NA 135 138  K 3.8 4.0  CL 102 101  CO2 24 26  GLUCOSE 110* 129*  BUN 7 5*  CREATININE 0.89 0.88  CALCIUM 8.9 9.2   Liver Function Tests: No results found for this basename: AST, ALT, ALKPHOS, BILITOT, PROT, ALBUMIN,  in the last 168 hours No results found for this basename: LIPASE, AMYLASE,  in the last 168 hours No results found for this basename: AMMONIA,  in the last 168 hours CBC:  Recent Labs Lab 04/05/13 0420 04/06/13 0437  WBC 10.6* 17.3*  HGB 12.0* 12.2*  HCT 36.2* 35.4*  MCV 82.1 81.4  PLT 296 355   Cardiac Enzymes: No results found for this basename: CKTOTAL, CKMB, CKMBINDEX, TROPONINI,  in the last 168 hours BNP: No components found with this basename: POCBNP,   CBG: No results found for this basename: GLUCAP,  in the last 168 hours  Radiological Exams on Admission: Dg Knee Left Port  04/04/2013   CLINICAL DATA:  Postop from right total knee replacement.  EXAM: PORTABLE LEFT KNEE - 1-2 VIEW  COMPARISON:  10/16/2012  FINDINGS: Prosthesis is in near anatomic alignment. No fracture is identified. Surgical drain is present. Soft tissue gas is seen, expected postoperatively. Marland Kitchen  IMPRESSION: Expected postoperative changes following total knee arthroplasty.   Electronically Signed   By: Jerene Dilling M.D.   On: 04/04/2013 16:30    EKG: sinus tachycardia  Time spent: 65 min  Davan Nawabi [facility] Pager [phone]  If 7PM-7AM, please contact night-coverage [email or web address] Password [email or web address] 04/06/2013, 11:11 AM

## 2013-04-06 NOTE — Progress Notes (Signed)
Physical Therapy Treatment Patient Details Name: Hector [name] MRN: [id number] DOB: August 17, 1955 Today's Date: 04/06/2013 Time: 0102-7253 PT Time Calculation (min): 25 min  PT Assessment / Plan / Recommendation  History of Present Illness 57 yo male s/p L TKA 9/30   PT Comments   POD # 2 am session.  Applied KI and instructed pt on use for amb.  Assisted pt OOB to amb in hallway.  Pt required increased time and had MAX c/o 9/10 knee pain. Positioned in recliner.    Follow Up Recommendations  Home health PT     Does the patient have the potential to tolerate intense rehabilitation     Barriers to Discharge        Equipment Recommendations  Rolling walker with 5" wheels    Recommendations for Other Services    Frequency 7X/week   Progress towards PT Goals Progress towards PT goals: Progressing toward goals  Plan Current plan remains appropriate    Precautions / Restrictions Precautions Precautions: Knee Precaution Comments: Instructed pt on KI use for amb Required Braces or Orthoses: Knee Immobilizer - Left Knee Immobilizer - Left: Discontinue once straight leg raise with < 10 degree lag Restrictions Weight Bearing Restrictions: No LLE Weight Bearing: Weight bearing as tolerated    Pertinent Vitals/Pain C/o 9/10 knee pain    Mobility  Bed Mobility Bed Mobility: Supine to Sit Supine to Sit: 4: Min assist Details for Bed Mobility Assistance: Assist for L LE and increased time due to c/o 9/10 pain Transfers Transfers: Sit to Stand;Stand to Sit Sit to Stand: 4: Min guard;From bed Stand to Sit: 4: Min guard;To bed Details for Transfer Assistance: VCS safety, technique, hand placement plus increased time Ambulation/Gait Ambulation/Gait Assistance: 4: Min guard Ambulation Distance (Feet): 115 Feet Assistive device: Rolling walker Ambulation/Gait Assistance Details: 50% Vc's on proper sequencing and upright posture plus increased time as pt took nearly 10 min to complete  amb. Gait Pattern: Step-to pattern;Step-through pattern;Decreased stride length;Decreased step length - left;Antalgic;Trunk flexed Gait velocity: decreased    Exercises   Total Knee Replacement TE's 10 reps B LE ankle pumps 10 reps knee presses 10 reps heel slides  10 reps SAQ's 10 reps SLR's 10 reps ABD Followed by ICE    PT Goals (current goals can now be found in the care plan section)    Visit Information  Last PT Received On: 04/06/13 Assistance Needed: +1 History of Present Illness: 57 yo male s/p L TKA 9/30    Subjective Data      Cognition       Balance     End of Session PT - End of Session Equipment Utilized During Treatment: Gait belt Activity Tolerance: Patient limited by pain Patient left: in chair;with call bell/phone within reach   Felecia Shelling  PTA [facility]  Acute  Rehab Pager      [phone]

## 2013-04-07 DIAGNOSIS — R000 Tachycardia, unspecified: Secondary | ICD-10-CM

## 2013-04-07 DIAGNOSIS — M1710 Unilateral primary osteoarthritis, unspecified knee: Secondary | ICD-10-CM

## 2013-04-07 DIAGNOSIS — M79609 Pain in unspecified limb: Secondary | ICD-10-CM

## 2013-04-07 DIAGNOSIS — IMO0002 Reserved for concepts with insufficient information to code with codable children: Secondary | ICD-10-CM

## 2013-04-07 LAB — CBC
HCT: 31.7 % — ABNORMAL LOW (ref 39.0–52.0)
Hemoglobin: 10.7 g/dL — ABNORMAL LOW (ref 13.0–17.0)
MCH: 27 pg (ref 26.0–34.0)
MCHC: 33.8 g/dL (ref 30.0–36.0)
MCV: 79.8 fL (ref 78.0–100.0)
RBC: 3.97 MIL/uL — ABNORMAL LOW (ref 4.22–5.81)

## 2013-04-07 MED ORDER — ALLOPURINOL 300 MG PO TABS: 300.0000 mg | ORAL_TABLET | Freq: Every day | ORAL | Status: DC

## 2013-04-07 MED ORDER — COLCHICINE 0.6 MG PO TABS: 0.6000 mg | ORAL_TABLET | Freq: Two times a day (BID) | ORAL | Status: DC

## 2013-04-07 NOTE — Progress Notes (Signed)
[facility] PROGRESS NOTE  Hector [name] WUJ:[id number] DOB: May 08, 1956 DOA: 04/04/2013 PCP: No PCP Per Patient  Assessment/Plan: 1. Tachycardia: Resolved, was possibly related to left knee pain and prior fever. EKG shows sinus tachycardia. He denies any chest pain or palpitations. He is afebrile today and CT angiogram of chest negative for PE , atelectasis noted.  2. Sob, hypoxia: possibly from a combination of atelectasis and a component of copd. He was initially worked up for PE AND DVT, venous duplex and CT angio of the chest came back negative. -Resolved  3. Left knee pain and swelling: Pain beginning to improve. He has a h/o gout and feels that the pain is similiar to a gout flare .continue colchicine, allopurinol and celebrex upon discharge 4. Leukocytosis: probably from stress demargination/reactive from possible gout as above. Pro calcitonin levels within normal limits at 0.46. CXR does not show any pneumonia and UA does not show infection.  5. Left knee replacement: further management as per ortho.  6. DVT prophylaxis.   Medically stable from medicine standpoint, will up with PCP at urgent care at previous  Code Status: full Family Communication: family at bedside Disposition Plan: Per primary Team    Procedures: Status post LEFT TOTAL KNEE ARTHROPLASTY (Left) and Release of Flexion Contracture   Antibiotics:  none  HPI/Subjective: States still with left knee pain but better. Denies chest pain and no shortness of breath  Objective: Filed Vitals:   04/07/13 0614  BP: 129/80  Pulse: 95  Temp: 98.9 F (37.2 C)  Resp: 16    Intake/Output Summary (Last 24 hours) at 04/07/13 1100 Last data filed at 04/07/13 0615  Gross per 24 hour  Intake    460 ml  Output   1900 ml  Net  -1440 ml   Filed Weights   04/04/13 1342  Weight: 80.287 kg (177 lb)    Exam General: alert & oriented x 3 In NAD Cardiovascular: RRR, nl S1 s2 Respiratory: CTAB Abdomen: soft +BS  NT/ND, no masses palpable Extremities: Left knee incision clean and dry, left leg with +1 edema, no cyanosis. Right lower extremity within normal limits   Data Reviewed: Basic Metabolic Panel:  Recent Labs Lab 04/05/13 0420 04/06/13 0437  NA 135 138  K 3.8 4.0  CL 102 101  CO2 24 26  GLUCOSE 110* 129*  BUN 7 5*  CREATININE 0.89 0.88  CALCIUM 8.9 9.2   Liver Function Tests: No results found for this basename: AST, ALT, ALKPHOS, BILITOT, PROT, ALBUMIN,  in the last 168 hours No results found for this basename: LIPASE, AMYLASE,  in the last 168 hours No results found for this basename: AMMONIA,  in the last 168 hours CBC:  Recent Labs Lab 04/05/13 0420 04/06/13 0437 04/07/13 0409  WBC 10.6* 17.3* 15.2*  HGB 12.0* 12.2* 10.7*  HCT 36.2* 35.4* 31.7*  MCV 82.1 81.4 79.8  PLT 296 355 361   Cardiac Enzymes: No results found for this basename: CKTOTAL, CKMB, CKMBINDEX, TROPONINI,  in the last 168 hours BNP (last 3 results) No results found for this basename: PROBNP,  in the last 8760 hours CBG: No results found for this basename: GLUCAP,  in the last 168 hours  Recent Results (from the past 240 hour(s))  SURGICAL PCR SCREEN     Status: None   Collection Time    03/30/13  8:15 AM      Result Value Range Status   MRSA, PCR NEGATIVE  NEGATIVE Final   Staphylococcus  aureus NEGATIVE  NEGATIVE Final   Comment:            The Xpert SA Assay (FDA     approved for NASAL specimens     in patients over 29 years of age),     is one component of     a comprehensive surveillance     program.  Test performance has     been validated by [employer] for patients greater     than or equal to 27 year old.     It is not intended     to diagnose infection nor to     guide or monitor treatment.     Studies: Dg Chest 2 View  04/06/2013   CLINICAL DATA:  Asymptomatic tachycardia.  EXAM: CHEST  2 VIEW  COMPARISON:  03/30/2013  FINDINGS: Lungs are adequately inflated without  focal consolidation or effusion. There is minimal prominence of the perihilar markings which may be due to mild vascular congestion. Cardiomediastinal silhouette and remainder of the exam is unchanged.  IMPRESSION: Minimal prominence of the perihilar markings which may be due to mild vascular congestion.   Electronically Signed   By: Elberta Fortis M.D.   On: 04/06/2013 12:12   Ct Angio Chest Pe W/cm &/or Wo Cm  04/06/2013   CLINICAL DATA:  Three days postop from the surgery. Shortness of breath with hypoxia and tachycardia.  EXAM: CT ANGIOGRAPHY CHEST WITH CONTRAST  TECHNIQUE: Multidetector CT imaging of the chest was performed using the standard protocol during bolus administration of intravenous contrast. Multiplanar CT image reconstructions including MIPs were obtained to evaluate the vascular anatomy.  CONTRAST:  OMNIPAQUE IOHEXOL 350 MG/ML SOLN  COMPARISON:  Chest radiograph, 04/06/2013  FINDINGS: There is no evidence of a pulmonary embolism.  The heart is normal in size and configuration. The great vessels are normal in caliber. No aortic dissection. The esophagus is mildly distended with fluid. There is no wall thickening.  There is a mildly enlarged left paratracheal lymph node that measures 13 mm in short axis. There are several other subcentimeter but mildly prominent mediastinal lymph nodes. There are are prominent, but not pathologically enlarged hilar lymph nodes bilaterally. Just below the right inferior pulmonary vein there is an area of low-density soft tissue attenuation that measures 25 mm by 17 mm in size. This is nonspecific. It could reflect a mildly enlarged lymph node. It could reflect a benign duplication cyst.  There is dependent hazy opacity in the lower lobes and, to a lesser degree, upper lobes, most consistent with atelectasis. The trachea and bronchi are widely patent. Lungs all show changes of paraseptal and centrilobular emphysema in the upper lobes with mild areas of  peripheral interstitial thickening. There is no convincing infiltrate. There is no pulmonary edema. No pleural effusion or pneumothorax is seen.  Limited evaluation of the upper abdomen is unremarkable.  There are minor degenerative changes of the thoracic spine. No osteoblastic or osteolytic lesions.  Review of the MIP images confirms the above findings.  IMPRESSION: 1. No evidence of a pulmonary embolism. No acute findings. 2. Dependent lung subsegmental atelectasis. No infiltrate or edema. 3. Mild emphysema. 4. Low attenuation oval mass or cystic lesion lies adjacent to the right inferior pulmonary vein as confluence with the left atrium. This may reflect a duplication cyst or a mildly enlarged lymph node. It is nonspecific.   Electronically Signed   By: Amie Portland   On: 04/06/2013  16:52    Scheduled Meds: . allopurinol  300 mg Oral Daily  . celecoxib  200 mg Oral Q12H  . colchicine  0.6 mg Oral BID  . famotidine  10 mg Oral Daily  . ferrous sulfate  325 mg Oral TID PC  . rivaroxaban  10 mg Oral Q breakfast   Continuous Infusions: . lactated ringers Stopped (04/05/13 1700)    Active Problems:   Tachycardia    Time spent: 25    [facility]  [facility] Pager [phone]. If 7PM-7AM, please contact night-coverage at [email or web address], password [email or web address] 04/07/2013, 11:00 AM  LOS: 3 days

## 2013-04-07 NOTE — Progress Notes (Signed)
Pt to d/c home with [facility]. DME in room at d/c. AVS reviewed and "My Chart" discussed with pt. Pt capable of verbalizing medications and follow-up appointments. Remains hemodynamically stable. No signs and symptoms of distress. Educated pt to return to ER in the case of SOB, dizziness, or chest pain.

## 2013-04-07 NOTE — Progress Notes (Signed)
   Subjective: 3 Days Post-Op Procedure(s) (LRB): LEFT TOTAL KNEE ARTHROPLASTY (Left) Patient reports pain as mild.   Patient seen in rounds without Dr. Darrelyn Hillock. Patient is well, and has had no acute complaints or problems. He reports that he felt ok with therapy yesterday. He denies SOB and chest pain. No issues overnight.  Plan is to go Home after hospital stay.  Objective: Vital signs in last 24 hours: Temp:  [97.8 F (36.6 C)-101.2 F (38.4 C)] 98.9 F (37.2 C) (10/03 4540) Pulse Rate:  [95-135] 95 (10/03 0614) Resp:  [16-18] 16 (10/03 0614) BP: (129-165)/(75-87) 129/80 mmHg (10/03 0614) SpO2:  [90 %-98 %] 97 % (10/03 0614)  Intake/Output from previous day:  Intake/Output Summary (Last 24 hours) at 04/07/13 0711 Last data filed at 04/07/13 0615  Gross per 24 hour  Intake    940 ml  Output   3000 ml  Net  -2060 ml     Labs:  Recent Labs  04/05/13 0420 04/06/13 0437 04/07/13 0409  HGB 12.0* 12.2* 10.7*    Recent Labs  04/06/13 0437 04/07/13 0409  WBC 17.3* 15.2*  RBC 4.35 3.97*  HCT 35.4* 31.7*  PLT 355 361    Recent Labs  04/05/13 0420 04/06/13 0437  NA 135 138  K 3.8 4.0  CL 102 101  CO2 24 26  BUN 7 5*  CREATININE 0.89 0.88  GLUCOSE 110* 129*  CALCIUM 8.9 9.2    EXAM General - Patient is Alert and Oriented Extremity - Neurologically intact Neurovascular intact Intact pulses distally Dorsiflexion/Plantar flexion intact No cellulitis present Compartment soft Dressing/Incision - clean, dry, no drainage Motor Function - intact, moving foot and toes well on exam.   Past Medical History  Diagnosis Date  . Gout     L knee  . GERD (gastroesophageal reflux disease)   . Arthritis     Assessment/Plan: 3 Days Post-Op Procedure(s) (LRB): LEFT TOTAL KNEE ARTHROPLASTY (Left) Active Problems:   Tachycardia  Estimated body mass index is 26.13 kg/(m^2) as calculated from the following:   Height as of this encounter: 5\' 9"  (1.753 m).  Weight as of this encounter: 80.287 kg (177 lb). Advance diet Up with therapy D/C IV fluids Discharge home with home health  DVT Prophylaxis - Xarelto Weight-Bearing as tolerated   He is doing much better yesterday. CXR, doppler, and spiral CT negative. Tachycardia resolved at this time. Will discharge home today after therapy. Will discharge on Oxycodone, Robaxin, Xarelto, and Colchicine. Will discontinue Colchicine upon resolution of gout. Discharge instruction given.   Izak Anding LAUREN 04/07/2013, 7:11 AM

## 2013-04-07 NOTE — Progress Notes (Signed)
Physical Therapy Treatment Patient Details Name: GUILLAUME WENINGER MRN: [id number] DOB: 1956-05-31 Today's Date: 04/07/2013 Time: 4098-1191 PT Time Calculation (min): 25 min  PT Assessment / Plan / Recommendation  History of Present Illness 57 yo male s/p L TKA 9/30   PT Comments   POD # 3 pt plans to D/C to home today.  Assisted pt OOB to BR then amb in hallway to practice stairs.    Follow Up Recommendations  Home health PT     Does the patient have the potential to tolerate intense rehabilitation     Barriers to Discharge        Equipment Recommendations  Rolling walker with 5" wheels    Recommendations for Other Services    Frequency 7X/week   Progress towards PT Goals Progress towards PT goals: Progressing toward goals  Plan      Precautions / Restrictions Precautions Precautions: Knee Precaution Comments: Pt able to perform active SLR Knee Immobilizer - Left: Discontinue once straight leg raise with < 10 degree lag Restrictions Weight Bearing Restrictions: No LLE Weight Bearing: Weight bearing as tolerated    Pertinent Vitals/Pain C/o 7/10 pain ICE applied    Mobility  Bed Mobility Bed Mobility: Supine to Sit;Sit to Supine Supine to Sit: 5: Supervision Sitting - Scoot to Edge of Bed: 5: Supervision Sit to Supine: 5: Supervision Details for Bed Mobility Assistance: increased time and pt uses B UE's to support L LE due to MAX c/o pain Transfers Transfers: Sit to Stand;Stand to Sit Sit to Stand: 5: Supervision;From bed Stand to Sit: 5: Supervision;To bed Details for Transfer Assistance: increased time and < 25% VC's safety with stand to sit Ambulation/Gait Ambulation/Gait Assistance: 5: Supervision Ambulation Distance (Feet): 142 Feet Assistive device: Rolling walker Ambulation/Gait Assistance Details: increased time and 25% VC's to increase WB thru L LE Gait Pattern: Step-to pattern;Step-through pattern;Decreased stride length;Decreased step length -  left;Antalgic;Trunk flexed Gait velocity: decreased Stairs: Yes Stairs Assistance: 5: Supervision;4: Min guard Stairs Assistance Details (indicate cue type and reason): Pt plans to use one rail and one crutch Stair Management Technique: Two rails;Forwards Number of Stairs: 2     PT Goals (current goals can now be found in the care plan section)    Visit Information  Last PT Received On: 04/07/13 Assistance Needed: +1 History of Present Illness: 57 yo male s/p L TKA 9/30    Subjective Data      Cognition       Balance     End of Session PT - End of Session Equipment Utilized During Treatment: Gait belt Activity Tolerance: Patient tolerated treatment well Patient left: in bed   Felecia Shelling  PTA [facility]  Acute  Rehab Pager      [phone]

## 2013-04-07 NOTE — Progress Notes (Signed)
Discharge summary sent to payer through [facility]  

## 2013-04-10 NOTE — Progress Notes (Signed)
Physician Discharge Summary   Patient ID: Hector [name] MRN: [id number] DOB/AGE: 55-Nov-1957 55 y.o.  Admit date: 04/04/2013 Discharge date: 04/07/2013  Primary Diagnosis: Osteoarthritis, left knee  Admission Diagnoses:  Past Medical History  Diagnosis Date  . Gout     L knee  . GERD (gastroesophageal reflux disease)   . Arthritis    Discharge Diagnoses:   Active Problems:   Tachycardia  Estimated body mass index is 26.13 kg/(m^2) as calculated from the following:   Height as of this encounter: 5\' 9"  (1.753 m).   Weight as of this encounter: 80.287 kg (177 lb).  Procedure:  Procedure(s) (LRB): LEFT TOTAL KNEE ARTHROPLASTY (Left)   Consults: Hospitalist/Medicine  HPI: Hector [name], 57 y.o. male, has a history of pain and functional disability in the left knee due to arthritis and has failed non-surgical conservative treatments for greater than 12 weeks to includeNSAID's and/or analgesics, flexibility and strengthening excercises and activity modification. Onset of symptoms was gradual, starting 10 years ago with gradually worsening course since that time. The patient noted no past surgery on the left knee(s). Patient currently rates pain in the left knee(s) at 5 out of 10 with activity. Patient has night pain, worsening of pain with activity and weight bearing, pain that interferes with activities of daily living, pain with passive range of motion, crepitus and joint swelling. Patient has evidence of periarticular osteophytes and joint space narrowing by imaging studies. There is no active infection.  Laboratory Data: Admission on 04/04/2013, Discharged on 04/07/2013  Component Date Value Range Status  . ABO/RH(D) 04/04/2013 O POS   Final  . Antibody Screen 04/04/2013 NEG   Final  . Sample Expiration 04/04/2013 04/07/2013   Final  . ABO/RH(D) 04/04/2013 O POS   Final  . WBC 04/05/2013 10.6* 4.0 - 10.5 K/uL Final  . RBC 04/05/2013 4.41  4.22 - 5.81 MIL/uL Final  . Hemoglobin  04/05/2013 12.0* 13.0 - 17.0 g/dL Final  . HCT 40/98/1191 36.2* 39.0 - 52.0 % Final  . MCV 04/05/2013 82.1  78.0 - 100.0 fL Final  . MCH 04/05/2013 27.2  26.0 - 34.0 pg Final  . MCHC 04/05/2013 33.1  30.0 - 36.0 g/dL Final  . RDW 47/82/9562 14.0  11.5 - 15.5 % Final  . Platelets 04/05/2013 296  150 - 400 K/uL Final  . Sodium 04/05/2013 135  135 - 145 mEq/L Final  . Potassium 04/05/2013 3.8  3.5 - 5.1 mEq/L Final  . Chloride 04/05/2013 102  96 - 112 mEq/L Final  . CO2 04/05/2013 24  19 - 32 mEq/L Final  . Glucose, Bld 04/05/2013 110* 70 - 99 mg/dL Final  . BUN 13/02/6577 7  6 - 23 mg/dL Final  . Creatinine, Ser 04/05/2013 0.89  0.50 - 1.35 mg/dL Final  . Calcium 46/96/2952 8.9  8.4 - 10.5 mg/dL Final  . GFR calc non Af Amer 04/05/2013 >90  >90 mL/min Final  . GFR calc Af Amer 04/05/2013 >90  >90 mL/min Final   Comment: (NOTE)                          The eGFR has been calculated using the CKD EPI equation.                          This calculation has not been validated in all clinical situations.  eGFR's persistently <90 mL/min signify possible Chronic Kidney                          Disease.  . WBC 04/06/2013 17.3* 4.0 - 10.5 K/uL Final  . RBC 04/06/2013 4.35  4.22 - 5.81 MIL/uL Final  . Hemoglobin 04/06/2013 12.2* 13.0 - 17.0 g/dL Final  . HCT 16/04/9603 35.4* 39.0 - 52.0 % Final  . MCV 04/06/2013 81.4  78.0 - 100.0 fL Final  . MCH 04/06/2013 28.0  26.0 - 34.0 pg Final  . MCHC 04/06/2013 34.5  30.0 - 36.0 g/dL Final  . RDW 54/03/8118 13.9  11.5 - 15.5 % Final  . Platelets 04/06/2013 355  150 - 400 K/uL Final  . Sodium 04/06/2013 138  135 - 145 mEq/L Final  . Potassium 04/06/2013 4.0  3.5 - 5.1 mEq/L Final  . Chloride 04/06/2013 101  96 - 112 mEq/L Final  . CO2 04/06/2013 26  19 - 32 mEq/L Final  . Glucose, Bld 04/06/2013 129* 70 - 99 mg/dL Final  . BUN 14/78/2956 5* 6 - 23 mg/dL Final  . Creatinine, Ser 04/06/2013 0.88  0.50 - 1.35 mg/dL Final  .  Calcium 21/30/8657 9.2  8.4 - 10.5 mg/dL Final  . GFR calc non Af Amer 04/06/2013 >90  >90 mL/min Final  . GFR calc Af Amer 04/06/2013 >90  >90 mL/min Final   Comment: (NOTE)                          The eGFR has been calculated using the CKD EPI equation.                          This calculation has not been validated in all clinical situations.                          eGFR's persistently <90 mL/min signify possible Chronic Kidney                          Disease.  Marland Kitchen D-Dimer, Quant 04/06/2013 1.54* 0.00 - 0.48 ug/mL-FEU Final   Comment:                                 AT THE INHOUSE ESTABLISHED CUTOFF                          VALUE OF 0.48 ug/mL FEU,                          THIS ASSAY HAS BEEN DOCUMENTED                          IN THE LITERATURE TO HAVE                          A SENSITIVITY AND NEGATIVE                          PREDICTIVE VALUE OF AT LEAST  98 TO 99%.  THE TEST RESULT                          SHOULD BE CORRELATED WITH                          AN ASSESSMENT OF THE CLINICAL                          PROBABILITY OF DVT / VTE.  . WBC 04/07/2013 15.2* 4.0 - 10.5 K/uL Final  . RBC 04/07/2013 3.97* 4.22 - 5.81 MIL/uL Final  . Hemoglobin 04/07/2013 10.7* 13.0 - 17.0 g/dL Final  . HCT 16/04/9603 31.7* 39.0 - 52.0 % Final  . MCV 04/07/2013 79.8  78.0 - 100.0 fL Final  . MCH 04/07/2013 27.0  26.0 - 34.0 pg Final  . MCHC 04/07/2013 33.8  30.0 - 36.0 g/dL Final  . RDW 54/03/8118 13.6  11.5 - 15.5 % Final  . Platelets 04/07/2013 361  150 - 400 K/uL Final  . Procalcitonin 04/06/2013 0.46   Final   Comment:                                 Interpretation:                          PCT (Procalcitonin) <= 0.5 ng/mL:                          Systemic infection (sepsis) is not likely.                          Local bacterial infection is possible.                          (NOTE)                                  ICU PCT Algorithm               Non ICU PCT  Algorithm                             ----------------------------     ------------------------------                                  PCT < 0.25 ng/mL                 PCT < 0.1 ng/mL                              Stopping of antibiotics            Stopping of antibiotics                                strongly encouraged.               strongly encouraged.                             ----------------------------     ------------------------------  PCT level decrease by               PCT < 0.25 ng/mL                                >= 80% from peak PCT                                OR PCT 0.25 - 0.5 ng/mL          Stopping of antibiotics                                                                      encouraged.                              Stopping of antibiotics                                    encouraged.                             ----------------------------     ------------------------------                                PCT level decrease by              PCT >= 0.25 ng/mL                                < 80% from peak PCT                                 AND PCT >= 0.5 ng/mL            Continuing antibiotics                                                                       encouraged.                                Continuing antibiotics                                     encouraged.                             ----------------------------     ------------------------------  PCT level increase compared          PCT > 0.5 ng/mL                                  with peak PCT AND                                   PCT >= 0.5 ng/mL             Escalation of antibiotics                                                                   strongly encouraged.                               Escalation of antibiotics                                 strongly encouraged.  Hospital Outpatient Visit on 03/30/2013  Component Date  Value Range Status  . aPTT 03/30/2013 37  24 - 37 seconds Final   Comment:                                 IF BASELINE aPTT IS ELEVATED,                          SUGGEST PATIENT RISK ASSESSMENT                          BE USED TO DETERMINE APPROPRIATE                          ANTICOAGULANT THERAPY.  . Sodium 03/30/2013 139  135 - 145 mEq/L Final  . Potassium 03/30/2013 4.2  3.5 - 5.1 mEq/L Final  . Chloride 03/30/2013 102  96 - 112 mEq/L Final  . CO2 03/30/2013 26  19 - 32 mEq/L Final  . Glucose, Bld 03/30/2013 97  70 - 99 mg/dL Final  . BUN 40/98/1191 7  6 - 23 mg/dL Final  . Creatinine, Ser 03/30/2013 0.85  0.50 - 1.35 mg/dL Final  . Calcium 47/82/9562 9.6  8.4 - 10.5 mg/dL Final  . Total Protein 03/30/2013 7.1  6.0 - 8.3 g/dL Final  . Albumin 13/02/6577 3.9  3.5 - 5.2 g/dL Final  . AST 46/96/2952 24  0 - 37 U/L Final  . ALT 03/30/2013 25  0 - 53 U/L Final  . Alkaline Phosphatase 03/30/2013 134* 39 - 117 U/L Final  . Total Bilirubin 03/30/2013 0.6  0.3 - 1.2 mg/dL Final  . GFR calc non Af Amer 03/30/2013 >90  >90 mL/min Final  . GFR calc Af Amer 03/30/2013 >90  >90 mL/min Final   Comment: (NOTE)  The eGFR has been calculated using the CKD EPI equation.                          This calculation has not been validated in all clinical situations.                          eGFR's persistently <90 mL/min signify possible Chronic Kidney                          Disease.  Marland Kitchen Prothrombin Time 03/30/2013 12.8  11.6 - 15.2 seconds Final  . INR 03/30/2013 0.98  0.00 - 1.49 Final  . Color, Urine 03/30/2013 YELLOW  YELLOW Final  . APPearance 03/30/2013 CLEAR  CLEAR Final  . Specific Gravity, Urine 03/30/2013 1.011  1.005 - 1.030 Final  . pH 03/30/2013 6.5  5.0 - 8.0 Final  . Glucose, UA 03/30/2013 NEGATIVE  NEGATIVE mg/dL Final  . Hgb urine dipstick 03/30/2013 NEGATIVE  NEGATIVE Final  . Bilirubin Urine 03/30/2013 NEGATIVE  NEGATIVE Final  . Ketones, ur 03/30/2013  NEGATIVE  NEGATIVE mg/dL Final  . Protein, ur 16/04/9603 NEGATIVE  NEGATIVE mg/dL Final  . Urobilinogen, UA 03/30/2013 0.2  0.0 - 1.0 mg/dL Final  . Nitrite 54/03/8118 NEGATIVE  NEGATIVE Final  . Leukocytes, UA 03/30/2013 MODERATE* NEGATIVE Final  . WBC 03/30/2013 6.7  4.0 - 10.5 K/uL Final  . RBC 03/30/2013 5.43  4.22 - 5.81 MIL/uL Final  . Hemoglobin 03/30/2013 14.9  13.0 - 17.0 g/dL Final  . HCT 14/78/2956 44.5  39.0 - 52.0 % Final  . MCV 03/30/2013 82.0  78.0 - 100.0 fL Final  . MCH 03/30/2013 27.4  26.0 - 34.0 pg Final  . MCHC 03/30/2013 33.5  30.0 - 36.0 g/dL Final  . RDW 21/30/8657 14.0  11.5 - 15.5 % Final  . Platelets 03/30/2013 322  150 - 400 K/uL Final  . MRSA, PCR 03/30/2013 NEGATIVE  NEGATIVE Final  . Staphylococcus aureus 03/30/2013 NEGATIVE  NEGATIVE Final   Comment:                                 The Xpert SA Assay (FDA                          approved for NASAL specimens                          in patients over 87 years of age),                          is one component of                          a comprehensive surveillance                          program.  Test performance has                          been validated by [employer]  [facility] for patients greater                          than or equal to 52 year old.                          It is not intended                          to diagnose infection nor to                          guide or monitor treatment.  . Squamous Epithelial / LPF 03/30/2013 FEW* RARE Final  . WBC, UA 03/30/2013 7-10  <3 WBC/hpf Final  . RBC / HPF 03/30/2013 0-2  <3 RBC/hpf Final     X-Rays:Dg Chest 2 View  04/06/2013   CLINICAL DATA:  Asymptomatic tachycardia.  EXAM: CHEST  2 VIEW  COMPARISON:  03/30/2013  FINDINGS: Lungs are adequately inflated without focal consolidation or effusion. There is minimal prominence of the perihilar markings which may be due to mild vascular congestion. Cardiomediastinal silhouette  and remainder of the exam is unchanged.  IMPRESSION: Minimal prominence of the perihilar markings which may be due to mild vascular congestion.   Electronically Signed   By: Elberta Fortis M.D.   On: 04/06/2013 12:12   Dg Chest 2 View  03/30/2013   CLINICAL DATA:  Preoperative evaluation ; smoking history  EXAM: CHEST  2 VIEW  COMPARISON:  February 17, 2006  FINDINGS: The lungs are clear. Flattening of the hemidiaphragms suggests underlying emphysematous change. The heart size and pulmonary vascularity are normal. No adenopathy. No bone lesions.  IMPRESSION: Suspect a degree of underlying emphysema. No edema or consolidation.   Electronically Signed   By: Bretta Bang   On: 03/30/2013 08:37   Ct Angio Chest Pe W/cm &/or Wo Cm  04/06/2013   CLINICAL DATA:  Three days postop from the surgery. Shortness of breath with hypoxia and tachycardia.  EXAM: CT ANGIOGRAPHY CHEST WITH CONTRAST  TECHNIQUE: Multidetector CT imaging of the chest was performed using the standard protocol during bolus administration of intravenous contrast. Multiplanar CT image reconstructions including MIPs were obtained to evaluate the vascular anatomy.  CONTRAST:  OMNIPAQUE IOHEXOL 350 MG/ML SOLN  COMPARISON:  Chest radiograph, 04/06/2013  FINDINGS: There is no evidence of a pulmonary embolism.  The heart is normal in size and configuration. The great vessels are normal in caliber. No aortic dissection. The esophagus is mildly distended with fluid. There is no wall thickening.  There is a mildly enlarged left paratracheal lymph node that measures 13 mm in short axis. There are several other subcentimeter but mildly prominent mediastinal lymph nodes. There are are prominent, but not pathologically enlarged hilar lymph nodes bilaterally. Just below the right inferior pulmonary vein there is an area of low-density soft tissue attenuation that measures 25 mm by 17 mm in size. This is nonspecific. It could reflect a mildly enlarged lymph  node. It could reflect a benign duplication cyst.  There is dependent hazy opacity in the lower lobes and, to a lesser degree, upper lobes, most consistent with atelectasis. The trachea and bronchi are widely patent. Lungs all show changes of paraseptal and centrilobular emphysema in the upper lobes with mild areas of peripheral interstitial thickening. There is no convincing infiltrate. There is  no pulmonary edema. No pleural effusion or pneumothorax is seen.  Limited evaluation of the upper abdomen is unremarkable.  There are minor degenerative changes of the thoracic spine. No osteoblastic or osteolytic lesions.  Review of the MIP images confirms the above findings.  IMPRESSION: 1. No evidence of a pulmonary embolism. No acute findings. 2. Dependent lung subsegmental atelectasis. No infiltrate or edema. 3. Mild emphysema. 4. Low attenuation oval mass or cystic lesion lies adjacent to the right inferior pulmonary vein as confluence with the left atrium. This may reflect a duplication cyst or a mildly enlarged lymph node. It is nonspecific.   Electronically Signed   By: Amie Portland   On: 04/06/2013 16:52   Dg Knee Left Port  04/04/2013   CLINICAL DATA:  Postop from right total knee replacement.  EXAM: PORTABLE LEFT KNEE - 1-2 VIEW  COMPARISON:  10/16/2012  FINDINGS: Prosthesis is in near anatomic alignment. No fracture is identified. Surgical drain is present. Soft tissue gas is seen, expected postoperatively. Marland Kitchen  IMPRESSION: Expected postoperative changes following total knee arthroplasty.   Electronically Signed   By: Jerene Dilling M.D.   On: 04/04/2013 16:30    EKG: Orders placed during the hospital encounter of 04/04/13  . EKG 12-LEAD  . EKG 12-LEAD  . EKG 12-LEAD  . EKG 12-LEAD  . EKG     Hospital Course: Hector [name] is a 57 y.o. who was admitted to [facility] They were brought to the operating room on 04/04/2013 and underwent Procedure(s): LEFT TOTAL KNEE ARTHROPLASTY.   Patient tolerated the procedure well and was later transferred to the recovery room and then to the orthopaedic floor for postoperative care.  They were given PO and IV analgesics for pain control following their surgery.  They were given 24 hours of postoperative antibiotics of  Anti-infectives   Start     Dose/Rate Route Frequency Ordered Stop   04/04/13 1900  ceFAZolin (ANCEF) IVPB 1 g/50 mL premix     1 g 100 mL/hr over 30 Minutes Intravenous Every 6 hours 04/04/13 1704 04/05/13 0125   04/04/13 1329  polymyxin B 500,000 Units, bacitracin 50,000 Units in sodium chloride irrigation 0.9 % 500 mL irrigation  Status:  Discontinued       As needed 04/04/13 1329 04/04/13 1518   04/04/13 0912  ceFAZolin (ANCEF) IVPB 2 g/50 mL premix     2 g 100 mL/hr over 30 Minutes Intravenous On call to O.R. 04/04/13 0912 04/04/13 1312     and started on DVT prophylaxis in the form of Xarelto.   PT and OT were ordered for total joint protocol.  Discharge planning consulted to help with postop disposition and equipment needs.  Patient had a decent night on the evening of surgery.  They started to get up OOB with therapy on day one. Hemovac drain was pulled without difficulty.  Continued to work with therapy into day two.  Dressing was changed on day two and the incision had mild bloody drainage. The patient was progressing well with therapy but had some issues with tachycardia. Hospitalist consulted and felt that increase was secondary to pain brought on by gout flare. Patient denies SOB and chest pain. LE doppler negative. CXR and spiral CT negative.  By day three, the patient had progressed with therapy and meeting their goals.  Incision was healing well. Patient's tachycardia resolved. Patient was seen in rounds and was ready to go home.   Discharge Medications: Prior to Admission  medications   Medication Sig Start Date End Date Taking? Authorizing Provider  ranitidine (ZANTAC) 150 MG tablet Take 150 mg by mouth 2  (two) times daily as needed for heartburn (for acid reflux).   Yes Historical Provider, MD  allopurinol (ZYLOPRIM) 300 MG tablet Take 1 tablet (300 mg total) by mouth daily. 04/07/13   Kela Millin, MD  colchicine 0.6 MG tablet Take 1 tablet (0.6 mg total) by mouth 2 (two) times daily. 04/07/13   Skilynn Durney Tamala Ser, PA-C  methocarbamol (ROBAXIN) 500 MG tablet Take 1 tablet (500 mg total) by mouth every 6 (six) hours as needed. 04/06/13   Younique Casad Tamala Ser, PA-C  oxyCODONE (OXY IR/ROXICODONE) 5 MG immediate release tablet Take 1-3 tablets (5-15 mg total) by mouth every 4 (four) hours as needed. 04/06/13   Aldo Sondgeroth Tamala Ser, PA-C  rivaroxaban (XARELTO) 10 MG TABS tablet Take 1 tablet (10 mg total) by mouth daily with breakfast. 04/06/13   Sunita Demond Tamala Ser, PA-C    Diet: Regular diet Activity:WBAT Follow-up:in 2 weeks Disposition - Home Discharged Condition: stable   Discharge Orders   Future Orders Complete By Expires   Call MD / Call 911  As directed    Comments:     If you experience chest pain or shortness of breath, CALL 911 and be transported to the hospital emergency room.  If you develope a fever above 101 F, pus (white drainage) or increased drainage or redness at the wound, or calf pain, call your surgeon's office.   Constipation Prevention  As directed    Comments:     Drink plenty of fluids.  Prune juice may be helpful.  You may use a stool softener, such as Colace (over the counter) 100 mg twice a day.  Use MiraLax (over the counter) for constipation as needed.   Diet - low sodium heart healthy  As directed    Discharge instructions  As directed    Comments:     Walk with your walker. Weight bearing as tolerated. Home Health Agency will follow you at home for your therapy  Do not change your dressing unless there is excess drainage Shower only, no tub bath. Call if any temperatures greater than 101 or any wound complications: [phone] during the day and ask  for Dr. Jeannetta Ellis nurse, [facility].   Do not put a pillow under the knee. Place it under the heel.  As directed    Driving restrictions  As directed    Comments:     No driving   Increase activity slowly as tolerated  As directed        Medication List         allopurinol 300 MG tablet  Commonly known as:  ZYLOPRIM  Take 1 tablet (300 mg total) by mouth daily.     colchicine 0.6 MG tablet  Take 1 tablet (0.6 mg total) by mouth 2 (two) times daily.     methocarbamol 500 MG tablet  Commonly known as:  ROBAXIN  Take 1 tablet (500 mg total) by mouth every 6 (six) hours as needed.     oxyCODONE 5 MG immediate release tablet  Commonly known as:  Oxy IR/ROXICODONE  Take 1-3 tablets (5-15 mg total) by mouth every 4 (four) hours as needed.     ranitidine 150 MG tablet  Commonly known as:  ZANTAC  Take 150 mg by mouth 2 (two) times daily as needed for heartburn (for acid reflux).     rivaroxaban  10 MG Tabs tablet  Commonly known as:  XARELTO  Take 1 tablet (10 mg total) by mouth daily with breakfast.           Follow-up Information   Follow up with GIOFFRE,RONALD A, MD. Schedule an appointment as soon as possible for a visit in 2 weeks.   Specialty:  Orthopedic Surgery   Contact information:   [address] [place] [address] [phone]       Signed: Dimitri Ped [facility] 04/10/2013, 8:43 AM

## 2014-01-20 ENCOUNTER — Ambulatory Visit (INDEPENDENT_AMBULATORY_CARE_PROVIDER_SITE_OTHER): Payer: BC Managed Care – PPO | Admitting: Emergency Medicine

## 2014-01-20 VITALS — BP 138/84 | HR 95 | Temp 98.2°F | Resp 18 | Ht 68.0 in | Wt 180.0 lb

## 2014-01-20 DIAGNOSIS — N3000 Acute cystitis without hematuria: Secondary | ICD-10-CM

## 2014-01-20 DIAGNOSIS — R1013 Epigastric pain: Secondary | ICD-10-CM

## 2014-01-20 DIAGNOSIS — G8929 Other chronic pain: Secondary | ICD-10-CM

## 2014-01-20 LAB — POCT UA - MICROSCOPIC ONLY
CASTS, UR, LPF, POC: NEGATIVE
CRYSTALS, UR, HPF, POC: NEGATIVE
Yeast, UA: NEGATIVE

## 2014-01-20 LAB — POCT URINALYSIS DIPSTICK
Glucose, UA: NEGATIVE
Ketones, UA: NEGATIVE
NITRITE UA: NEGATIVE
PH UA: 6
Protein, UA: 30
RBC UA: NEGATIVE
Spec Grav, UA: 1.015
Urobilinogen, UA: 2

## 2014-01-20 LAB — COMPREHENSIVE METABOLIC PANEL
ALBUMIN: 4.4 g/dL (ref 3.5–5.2)
ALK PHOS: 117 U/L (ref 39–117)
ALT: 24 U/L (ref 0–53)
AST: 23 U/L (ref 0–37)
BUN: 12 mg/dL (ref 6–23)
CO2: 23 mEq/L (ref 19–32)
Calcium: 9.4 mg/dL (ref 8.4–10.5)
Chloride: 107 mEq/L (ref 96–112)
Creat: 0.94 mg/dL (ref 0.50–1.35)
Glucose, Bld: 94 mg/dL (ref 70–99)
Potassium: 4.3 mEq/L (ref 3.5–5.3)
SODIUM: 142 meq/L (ref 135–145)
Total Bilirubin: 1.2 mg/dL (ref 0.2–1.2)
Total Protein: 7.1 g/dL (ref 6.0–8.3)

## 2014-01-20 LAB — CBC WITH DIFFERENTIAL/PLATELET
BASOS ABS: 0.1 10*3/uL (ref 0.0–0.1)
BASOS PCT: 1 % (ref 0–1)
Eosinophils Absolute: 0.5 10*3/uL (ref 0.0–0.7)
Eosinophils Relative: 6 % — ABNORMAL HIGH (ref 0–5)
HCT: 46.3 % (ref 39.0–52.0)
Hemoglobin: 16 g/dL (ref 13.0–17.0)
Lymphocytes Relative: 29 % (ref 12–46)
Lymphs Abs: 2.4 10*3/uL (ref 0.7–4.0)
MCH: 28 pg (ref 26.0–34.0)
MCHC: 34.6 g/dL (ref 30.0–36.0)
MCV: 81.1 fL (ref 78.0–100.0)
Monocytes Absolute: 0.6 10*3/uL (ref 0.1–1.0)
Monocytes Relative: 7 % (ref 3–12)
NEUTROS PCT: 57 % (ref 43–77)
Neutro Abs: 4.8 10*3/uL (ref 1.7–7.7)
PLATELETS: 313 10*3/uL (ref 150–400)
RBC: 5.71 MIL/uL (ref 4.22–5.81)
RDW: 14 % (ref 11.5–15.5)
WBC: 8.4 10*3/uL (ref 4.0–10.5)

## 2014-01-20 LAB — AMYLASE: Amylase: 209 U/L — ABNORMAL HIGH (ref 0–105)

## 2014-01-20 LAB — LIPASE: Lipase: 13 U/L (ref 0–75)

## 2014-01-20 MED ORDER — SULFAMETHOXAZOLE-TMP DS 800-160 MG PO TABS: 1.0000 | ORAL_TABLET | Freq: Two times a day (BID) | ORAL | Status: DC

## 2014-01-20 NOTE — Progress Notes (Signed)
[facility] [address], [place] [address] [phone]  Date:  01/20/2014   Name:  Hector [name]   DOB:  1956/05/26   MRN:  [id number]  PCP:  No PCP Per Patient    Chief Complaint: Abdominal Pain and Emesis   History of Present Illness:  Hector [name] is a 58 y.o. very pleasant male patient who presents with the following:  Works as a Designer, fashion/clothing, drinks a pint a week and a 12 pakc.  2 ppd smoker.  Now has paid across periumbilical region.  Vomits daily and has 2 losse stools.. Present this week since Wednesday. Pain quite severe end affected with eating.  No stool change.  No fever or chills.  No blood in stools.. No improvement with over the counter medications or other home remedies. Denies other complaint or health concern today.right ear ache  Patient Active Problem List   Diagnosis Date Noted  . Tachycardia 04/06/2013  . Osteoarthritis of left knee 01/28/2013  . ALCOHOL ABUSE 01/04/2007  . TOBACCO ABUSE 01/04/2007  . URI 01/04/2007  . GERD 01/04/2007  . WRIST PAIN 01/04/2007  . FOOT PAIN 01/04/2007  . VOMITING 01/04/2007  . PREHYPERTENSION 01/04/2007    Past Medical History  Diagnosis Date  . Gout     L knee  . GERD (gastroesophageal reflux disease)   . Arthritis     Past Surgical History  Procedure Laterality Date  . Right hand fracture    . Facial cosmetic surgery      due ot shattered bones   . Total knee arthroplasty Left 04/04/2013    Procedure: LEFT TOTAL KNEE ARTHROPLASTY;  Surgeon: Jacki Cones, MD;  Location: [facility];  Service: Orthopedics;  Laterality: Left;    History  Substance Use Topics  . Smoking status: Current Every Day Smoker -- 1.50 packs/day for 39 years    Types: Cigarettes  . Smokeless tobacco: Never Used  . Alcohol Use: 7.2 oz/week    12 Cans of beer per week    Family History  Problem Relation Age of Onset  . Cancer Mother     No Known Allergies  Medication list has been reviewed and  updated.  Current Outpatient Prescriptions on File Prior to Visit  Medication Sig Dispense Refill  . allopurinol (ZYLOPRIM) 300 MG tablet Take 1 tablet (300 mg total) by mouth daily.      . colchicine 0.6 MG tablet Take 1 tablet (0.6 mg total) by mouth 2 (two) times daily.  10 tablet  0  . methocarbamol (ROBAXIN) 500 MG tablet Take 1 tablet (500 mg total) by mouth every 6 (six) hours as needed.  40 tablet  1  . oxyCODONE (OXY IR/ROXICODONE) 5 MG immediate release tablet Take 1-3 tablets (5-15 mg total) by mouth every 4 (four) hours as needed.  80 tablet  0  . ranitidine (ZANTAC) 150 MG tablet Take 150 mg by mouth 2 (two) times daily as needed for heartburn (for acid reflux).      . rivaroxaban (XARELTO) 10 MG TABS tablet Take 1 tablet (10 mg total) by mouth daily with breakfast.  18 tablet  0   No current facility-administered medications on file prior to visit.    Review of Systems:  As per HPI, otherwise negative.    Physical Examination: Filed Vitals:   01/20/14 0832  BP: 138/84  Pulse: 95  Temp: 98.2 F (36.8 C)  Resp: 18   Filed Vitals:   01/20/14  [id number]  Height: 5\' 8"  (1.727 m)  Weight: 180 lb (81.647 kg)   Body mass index is 27.38 kg/(m^2). Ideal Body Weight: Weight in (lb) to have BMI = 25: 164.1  GEN: WDWN, NAD, Non-toxic, A & O x 3 HEENT: Atraumatic, Normocephalic. Neck supple. No masses, No LAD. Ears and Nose: No external deformity. CV: RRR, No M/G/R. No JVD. No thrill. No extra heart sounds. PULM: CTA B, no wheezes, crackles, rhonchi. No retractions. No resp. distress. No accessory muscle use. ABD: S, diffuse tenderness most in RLQ, ND, +BS. No rebound. No HSM. EXTR: No c/c/e NEURO Normal gait.  PSYCH: Normally interactive. Conversant. Not depressed or anxious appearing.  Calm demeanor.    Assessment and Plan: Abdominal pain h pylori, CBC, CMP uti  septra''   Signed,  Phillips OdorJeffery Demitrious Mccannon, MD   Results for orders placed in visit on 01/20/14  POCT  URINALYSIS DIPSTICK      Result Value Ref Range   Color, UA amber     Clarity, UA cloudy     Glucose, UA neg     Bilirubin, UA small     Ketones, UA neg     Spec Grav, UA 1.015     Blood, UA neg     pH, UA 6.0     Protein, UA 30     Urobilinogen, UA 2.0     Nitrite, UA neg     Leukocytes, UA moderate (2+)    POCT UA - MICROSCOPIC ONLY      Result Value Ref Range   WBC, Ur, HPF, POC 30-40     RBC, urine, microscopic 1-3     Bacteria, U Microscopic trace     Mucus, UA trace     Epithelial cells, urine per micros 1-3     Crystals, Ur, HPF, POC neg     Casts, Ur, LPF, POC neg     Yeast, UA neg

## 2014-01-20 NOTE — Patient Instructions (Signed)

## 2014-01-22 ENCOUNTER — Other Ambulatory Visit: Payer: Self-pay | Admitting: Emergency Medicine

## 2014-01-22 LAB — H. PYLORI ANTIBODY, IGG: H PYLORI IGG: 1.35 {ISR} — AB

## 2014-01-22 MED ORDER — AMOXICILL-CLARITHRO-LANSOPRAZ PO MISC: Freq: Two times a day (BID) | ORAL | Status: DC

## 2014-03-17 ENCOUNTER — Ambulatory Visit (INDEPENDENT_AMBULATORY_CARE_PROVIDER_SITE_OTHER): Payer: BC Managed Care – PPO | Admitting: Emergency Medicine

## 2014-03-17 VITALS — BP 148/88 | HR 91 | Temp 97.8°F | Resp 18 | Ht 68.0 in | Wt 187.0 lb

## 2014-03-17 DIAGNOSIS — K859 Acute pancreatitis without necrosis or infection, unspecified: Secondary | ICD-10-CM

## 2014-03-17 DIAGNOSIS — K852 Alcohol induced acute pancreatitis without necrosis or infection: Secondary | ICD-10-CM

## 2014-03-17 LAB — POCT CBC
GRANULOCYTE PERCENT: 64.2 % (ref 37–80)
HCT, POC: 48.4 % (ref 43.5–53.7)
Hemoglobin: 15.7 g/dL (ref 14.1–18.1)
Lymph, poc: 2.5 (ref 0.6–3.4)
MCH, POC: 28.4 pg (ref 27–31.2)
MCHC: 32.5 g/dL (ref 31.8–35.4)
MCV: 87.5 fL (ref 80–97)
MID (cbc): 0.5 (ref 0–0.9)
MPV: 7.6 fL (ref 0–99.8)
PLATELET COUNT, POC: 303 10*3/uL (ref 142–424)
POC GRANULOCYTE: 5.5 (ref 2–6.9)
POC LYMPH PERCENT: 29.7 %L (ref 10–50)
POC MID %: 6.1 % (ref 0–12)
RBC: 5.54 M/uL (ref 4.69–6.13)
RDW, POC: 14.6 %
WBC: 8.5 10*3/uL (ref 4.6–10.2)

## 2014-03-17 LAB — POCT URINALYSIS DIPSTICK
Bilirubin, UA: NEGATIVE
Blood, UA: NEGATIVE
Glucose, UA: NEGATIVE
KETONES UA: NEGATIVE
Nitrite, UA: NEGATIVE
Protein, UA: NEGATIVE
Spec Grav, UA: 1.015
Urobilinogen, UA: 0.2
pH, UA: 6

## 2014-03-17 LAB — COMPREHENSIVE METABOLIC PANEL
ALBUMIN: 4.5 g/dL (ref 3.5–5.2)
ALK PHOS: 101 U/L (ref 39–117)
ALT: 28 U/L (ref 0–53)
AST: 23 U/L (ref 0–37)
BUN: 10 mg/dL (ref 6–23)
CO2: 21 mEq/L (ref 19–32)
CREATININE: 0.83 mg/dL (ref 0.50–1.35)
Calcium: 9.8 mg/dL (ref 8.4–10.5)
Chloride: 106 mEq/L (ref 96–112)
Glucose, Bld: 95 mg/dL (ref 70–99)
POTASSIUM: 4.4 meq/L (ref 3.5–5.3)
Sodium: 140 mEq/L (ref 135–145)
Total Bilirubin: 0.8 mg/dL (ref 0.2–1.2)
Total Protein: 7 g/dL (ref 6.0–8.3)

## 2014-03-17 LAB — POCT UA - MICROSCOPIC ONLY
Casts, Ur, LPF, POC: NEGATIVE
Crystals, Ur, HPF, POC: NEGATIVE
Mucus, UA: NEGATIVE
RBC, URINE, MICROSCOPIC: NEGATIVE
Yeast, UA: NEGATIVE

## 2014-03-17 LAB — AMYLASE: AMYLASE: 626 U/L — AB (ref 0–105)

## 2014-03-17 MED ORDER — LANSOPRAZOLE 30 MG PO CPDR: 30.0000 mg | DELAYED_RELEASE_CAPSULE | Freq: Every day | ORAL | Status: DC

## 2014-03-17 NOTE — Progress Notes (Addendum)
[facility] [address] [place] [address] [phone]  Date:  03/17/2014   Name:  Hector [name]   DOB:  1956/02/13   MRN:  [id number]  PCP:  No PCP Per Patient    Chief Complaint: Follow-up   History of Present Illness:  Hector [name] is a 58 y.o. very pleasant male patient who presents with the following:  Still having pain in the upper back.  No nausea or vomiting.  No stool change.  No rash.  No fever or chills No GU symptoms.  Says "doesn't drink much".  Smokes Says less than 1 case/week. Feels better than last time. No improvement with over the counter medications or other home remedies. Denies other complaint or health concern today.   Patient Active Problem List   Diagnosis Date Noted  . Tachycardia 04/06/2013  . Osteoarthritis of left knee 01/28/2013  . ALCOHOL ABUSE 01/04/2007  . TOBACCO ABUSE 01/04/2007  . URI 01/04/2007  . GERD 01/04/2007  . WRIST PAIN 01/04/2007  . FOOT PAIN 01/04/2007  . VOMITING 01/04/2007  . PREHYPERTENSION 01/04/2007    Past Medical History  Diagnosis Date  . Gout     L knee  . GERD (gastroesophageal reflux disease)   . Arthritis     Past Surgical History  Procedure Laterality Date  . Right hand fracture    . Facial cosmetic surgery      due ot shattered bones   . Total knee arthroplasty Left 04/04/2013    Procedure: LEFT TOTAL KNEE ARTHROPLASTY;  Surgeon: Tobi Bastos, MD;  Location: [facility];  Service: Orthopedics;  Laterality: Left;    History  Substance Use Topics  . Smoking status: Current Every Day Smoker -- 1.50 packs/day for 39 years    Types: Cigarettes  . Smokeless tobacco: Never Used  . Alcohol Use: 7.2 oz/week    12 Cans of beer per week    Family History  Problem Relation Age of Onset  . Cancer Mother     No Known Allergies  Medication list has been reviewed and updated.  Current Outpatient Prescriptions on File Prior to Visit  Medication Sig Dispense Refill  .  allopurinol (ZYLOPRIM) 300 MG tablet Take 1 tablet (300 mg total) by mouth daily.      Marland Kitchen amoxicillin-clarithromycin-lansoprazole (PREVPAC) combo pack Take by mouth 2 (two) times daily. Follow package directions.  Dispense generic components  1 kit  0  . colchicine 0.6 MG tablet Take 1 tablet (0.6 mg total) by mouth 2 (two) times daily.  10 tablet  0  . methocarbamol (ROBAXIN) 500 MG tablet Take 1 tablet (500 mg total) by mouth every 6 (six) hours as needed.  40 tablet  1  . oxyCODONE (OXY IR/ROXICODONE) 5 MG immediate release tablet Take 1-3 tablets (5-15 mg total) by mouth every 4 (four) hours as needed.  80 tablet  0  . ranitidine (ZANTAC) 150 MG tablet Take 150 mg by mouth 2 (two) times daily as needed for heartburn (for acid reflux).      . rivaroxaban (XARELTO) 10 MG TABS tablet Take 1 tablet (10 mg total) by mouth daily with breakfast.  18 tablet  0  . sulfamethoxazole-trimethoprim (BACTRIM DS) 800-160 MG per tablet Take 1 tablet by mouth 2 (two) times daily.  20 tablet  0   No current facility-administered medications on file prior to visit.    Review of Systems:  As per HPI, otherwise negative.  Physical Examination: Filed Vitals:   03/17/14 0902  BP: 148/88  Pulse: 91  Temp: 97.8 F (36.6 C)  Resp: 18   Filed Vitals:   03/17/14 0902  Height: $Remove'5\' 8"'phLKhyc$  (1.727 m)  Weight: 187 lb (84.823 kg)   Body mass index is 28.44 kg/(m^2). Ideal Body Weight: Weight in (lb) to have BMI = 25: 164.1  GEN: WDWN, NAD, Non-toxic, A & O x 3 HEENT: Atraumatic, Normocephalic. Neck supple. No masses, No LAD. Ears and Nose: No external deformity. CV: RRR, No M/G/R. No JVD. No thrill. No extra heart sounds. PULM: CTA B, no wheezes, crackles, rhonchi. No retractions. No resp. distress. No accessory muscle use. ABD: S, NT, ND, +BS. No rebound. No HSM. EXTR: No c/c/e NEURO Normal gait.  PSYCH: Normally interactive. Conversant. Not depressed or anxious appearing.  Calm demeanor.    Assessment  and Plan: Labs pending. Pancreatitis likely from alcohol Cut back alcohol Prevacid  Signed,  Ellison Carwin, MD   Results for orders placed in visit on 03/17/14  POCT CBC      Result Value Ref Range   WBC 8.5  4.6 - 10.2 K/uL   Lymph, poc 2.5  0.6 - 3.4   POC LYMPH PERCENT 29.7  10 - 50 %L   MID (cbc) 0.5  0 - 0.9   POC MID % 6.1  0 - 12 %M   POC Granulocyte 5.5  2 - 6.9   Granulocyte percent 64.2  37 - 80 %G   RBC 5.54  4.69 - 6.13 M/uL   Hemoglobin 15.7  14.1 - 18.1 g/dL   HCT, POC 48.4  43.5 - 53.7 %   MCV 87.5  80 - 97 fL   MCH, POC 28.4  27 - 31.2 pg   MCHC 32.5  31.8 - 35.4 g/dL   RDW, POC 14.6     Platelet Count, POC 303  142 - 424 K/uL   MPV 7.6  0 - 99.8 fL  POCT UA - MICROSCOPIC ONLY      Result Value Ref Range   WBC, Ur, HPF, POC TNTC     RBC, urine, microscopic neg     Bacteria, U Microscopic trace     Mucus, UA neg     Epithelial cells, urine per micros 2-3     Crystals, Ur, HPF, POC neg     Casts, Ur, LPF, POC neg     Yeast, UA neg    POCT URINALYSIS DIPSTICK      Result Value Ref Range   Color, UA amber     Clarity, UA sl clouy     Glucose, UA neg     Bilirubin, UA neg     Ketones, UA neg     Spec Grav, UA 1.015     Blood, UA neg     pH, UA 6.0     Protein, UA neg     Urobilinogen, UA 0.2     Nitrite, UA neg     Leukocytes, UA moderate (2+)

## 2014-03-17 NOTE — Patient Instructions (Signed)

## 2014-03-19 ENCOUNTER — Other Ambulatory Visit: Payer: Self-pay | Admitting: Emergency Medicine

## 2014-03-19 DIAGNOSIS — R1013 Epigastric pain: Secondary | ICD-10-CM

## 2014-03-22 ENCOUNTER — Telehealth: Payer: Self-pay | Admitting: *Deleted

## 2014-03-22 NOTE — Telephone Encounter (Signed)
Pt states he has been taking naproxen 500 mg for the past 4 months.

## 2014-04-04 ENCOUNTER — Ambulatory Visit
Admission: RE | Admit: 2014-04-04 | Discharge: 2014-04-04 | Disposition: A | Discharge status: Home / Self Care | Payer: BC Managed Care – PPO | Source: Ambulatory Visit | Attending: Emergency Medicine | Admitting: Emergency Medicine

## 2014-04-04 DIAGNOSIS — R1013 Epigastric pain: Secondary | ICD-10-CM

## 2014-12-22 ENCOUNTER — Ambulatory Visit (INDEPENDENT_AMBULATORY_CARE_PROVIDER_SITE_OTHER): Payer: BLUE CROSS/BLUE SHIELD | Admitting: Physician Assistant

## 2014-12-22 VITALS — BP 132/88 | HR 89 | Temp 97.6°F | Resp 16 | Ht 69.0 in | Wt 186.8 lb

## 2014-12-22 DIAGNOSIS — J029 Acute pharyngitis, unspecified: Secondary | ICD-10-CM | POA: Diagnosis not present

## 2014-12-22 DIAGNOSIS — J069 Acute upper respiratory infection, unspecified: Secondary | ICD-10-CM | POA: Diagnosis not present

## 2014-12-22 LAB — POCT RAPID STREP A (OFFICE): RAPID STREP A SCREEN: NEGATIVE

## 2014-12-22 MED ORDER — IPRATROPIUM BROMIDE 0.03 % NA SOLN: 2.0000 | Freq: Two times a day (BID) | NASAL | Status: DC

## 2014-12-22 MED ORDER — MAGIC MOUTHWASH W/LIDOCAINE: 10.0000 mL | ORAL | Status: DC | PRN

## 2014-12-22 NOTE — Patient Instructions (Signed)
Use nasal spray twice a day. Use ibuprofen/tylenol for pain. Magic mouthwash to use before eating/drinking. I will call you with results of your throat culture. Return in 7-10 days if symptoms not improving.

## 2014-12-22 NOTE — Progress Notes (Signed)
[facility] [address] [place] [address] [phone]  Date:  12/22/2014   Name:  Hector [name]   DOB:  01-29-1956   MRN:  [id number]  PCP:  No PCP Per Patient    Chief Complaint: Sore Throat   History of Present Illness:  This is a 59 y.o. male who is presenting with sore throat x 1 day. States it hurts to swallow but is able to swallow. His nose has been "running for a while", about 3 days. Denies fever, chills, cough, otalgia. Hasn't tried anything for his pain. No sick contacts. No history of env allergies or asthma. He is a current every day smoker - 1 ppd.  Review of Systems:  Review of Systems See HPI.  Patient Active Problem List   Diagnosis Date Noted  . Tachycardia 04/06/2013  . Osteoarthritis of left knee 01/28/2013  . ALCOHOL ABUSE 01/04/2007  . TOBACCO ABUSE 01/04/2007  . URI 01/04/2007  . GERD 01/04/2007  . WRIST PAIN 01/04/2007  . FOOT PAIN 01/04/2007  . VOMITING 01/04/2007  . PREHYPERTENSION 01/04/2007    Prior to Admission medications   Medication Sig Start Date End Date Taking? Authorizing Provider  allopurinol (ZYLOPRIM) 300 MG tablet Take 1 tablet (300 mg total) by mouth daily. 04/07/13  Yes Adeline Joselyn Glassman, MD  amoxicillin-clarithromycin-lansoprazole (PREVPAC) combo pack Take by mouth 2 (two) times daily. Follow package directions.  Dispense generic components 01/22/14  Yes Carmelina Dane, MD  colchicine 0.6 MG tablet Take 1 tablet (0.6 mg total) by mouth 2 (two) times daily. 04/07/13  Yes Amber Celedonio Savage, PA-C  lansoprazole (PREVACID) 30 MG capsule Take 1 capsule (30 mg total) by mouth daily at 12 noon. 03/17/14  Yes Carmelina Dane, MD  methocarbamol (ROBAXIN) 500 MG tablet Take 1 tablet (500 mg total) by mouth every 6 (six) hours as needed. 04/06/13  Yes Amber Constable, PA-C  oxyCODONE (OXY IR/ROXICODONE) 5 MG immediate release tablet Take 1-3 tablets (5-15 mg total) by mouth every 4 (four) hours as needed. 04/06/13   Yes Amber Celedonio Savage, PA-C  ranitidine (ZANTAC) 150 MG tablet Take 150 mg by mouth 2 (two) times daily as needed for heartburn (for acid reflux).   Yes Historical Provider, MD  rivaroxaban (XARELTO) 10 MG TABS tablet Take 1 tablet (10 mg total) by mouth daily with breakfast. 04/06/13  Yes Amber Constable, PA-C  sulfamethoxazole-trimethoprim (BACTRIM DS) 800-160 MG per tablet Take 1 tablet by mouth 2 (two) times daily. 01/20/14  Yes Carmelina Dane, MD    No Known Allergies  Past Surgical History  Procedure Laterality Date  . Right hand fracture    . Facial cosmetic surgery      due ot shattered bones   . Total knee arthroplasty Left 04/04/2013    Procedure: LEFT TOTAL KNEE ARTHROPLASTY;  Surgeon: Jacki Cones, MD;  Location: [facility];  Service: Orthopedics;  Laterality: Left;    History  Substance Use Topics  . Smoking status: Current Every Day Smoker -- 1.50 packs/day for 39 years    Types: Cigarettes  . Smokeless tobacco: Never Used  . Alcohol Use: 7.2 oz/week    12 Cans of beer per week    Family History  Problem Relation Age of Onset  . Cancer Mother     Medication list has been reviewed and updated.  Physical Examination:  Physical Exam  Constitutional: He is oriented to person, place, and time. He appears well-developed and well-nourished. No distress.  HENT:  Head: Normocephalic and atraumatic.  Right Ear: Hearing, tympanic membrane, external ear and ear canal normal.  Left Ear: Hearing, tympanic membrane, external ear and ear canal normal.  Nose: Nose normal. Right sinus exhibits no maxillary sinus tenderness and no frontal sinus tenderness. Left sinus exhibits no maxillary sinus tenderness and no frontal sinus tenderness.  Mouth/Throat: Uvula is midline and mucous membranes are normal. Posterior oropharyngeal erythema present. No oropharyngeal exudate or posterior oropharyngeal edema.  Eyes: Conjunctivae and lids are normal. Right eye exhibits no discharge. Left  eye exhibits no discharge. No scleral icterus.  Cardiovascular: Normal rate, regular rhythm, normal heart sounds and normal pulses.   No murmur heard. Pulmonary/Chest: Effort normal and breath sounds normal. No respiratory distress. He has no wheezes. He has no rhonchi. He has no rales.  Musculoskeletal: Normal range of motion.  Lymphadenopathy:       Head (right side): No submental, no submandibular and no tonsillar adenopathy present.       Head (left side): No submental, no submandibular and no tonsillar adenopathy present.    He has no cervical adenopathy.  Neurological: He is alert and oriented to person, place, and time.  Skin: Skin is warm, dry and intact. No lesion and no rash noted.  Psychiatric: He has a normal mood and affect. His speech is normal and behavior is normal. Thought content normal.   BP 132/88 mmHg  Pulse 89  Temp(Src) 97.6 F (36.4 C) (Oral)  Resp 16  Ht  (1.753 m)  Wt 186 lb 12.8 oz (84.732 kg)  BMI 27.57 kg/m2  SpO2 97%  Results for orders placed or performed in visit on 12/22/14  POCT rapid strep A  Result Value Ref Range   Rapid Strep A Screen Negative Negative    Assessment and Plan:  1. Sore throat 2. Viral URI Throat sore likely from post-nasal drip. Etiology likely viral. Focus is on supportive care, see meds prescribed below. Return in 7-10 days if symptoms are not improving. - POCT rapid strep A - Culture, Group A Strep - Alum & Mag Hydroxide-Simeth (MAGIC MOUTHWASH W/LIDOCAINE) SOLN; Take 10 mLs by mouth every 2 (two) hours as needed for mouth pain.  Dispense: 360 mL; Refill: 0 - ipratropium (ATROVENT) 0.03 % nasal spray; Place 2 sprays into both nostrils 2 (two) times daily.  Dispense: 30 mL; Refill: 0   Rica Heather V. Dyke Brackett, MHS [facility]  12/22/2014

## 2014-12-24 LAB — CULTURE, GROUP A STREP: Organism ID, Bacteria: NORMAL

## 2015-01-05 ENCOUNTER — Other Ambulatory Visit: Payer: Self-pay | Admitting: Physician Assistant

## 2015-01-08 NOTE — Telephone Encounter (Signed)
Hector Reiningicole, do you want to give RFs to pt?

## 2015-01-09 NOTE — Telephone Encounter (Signed)
Hopefully his symptoms should be getting better by now and he should not need a refill. If his symptoms are not improving, he should RTC.

## 2016-08-25 DIAGNOSIS — I10 Essential (primary) hypertension: Secondary | ICD-10-CM | POA: Diagnosis not present

## 2016-11-16 DIAGNOSIS — M10071 Idiopathic gout, right ankle and foot: Secondary | ICD-10-CM | POA: Diagnosis not present

## 2016-11-20 ENCOUNTER — Ambulatory Visit (INDEPENDENT_AMBULATORY_CARE_PROVIDER_SITE_OTHER): Payer: BLUE CROSS/BLUE SHIELD | Admitting: Physician Assistant

## 2016-11-20 ENCOUNTER — Encounter: Payer: Self-pay | Admitting: Physician Assistant

## 2016-11-20 ENCOUNTER — Ambulatory Visit (INDEPENDENT_AMBULATORY_CARE_PROVIDER_SITE_OTHER): Payer: BLUE CROSS/BLUE SHIELD

## 2016-11-20 VITALS — BP 110/72 | HR 98 | Temp 98.4°F | Resp 16 | Ht 69.0 in | Wt 176.2 lb

## 2016-11-20 DIAGNOSIS — M109 Gout, unspecified: Secondary | ICD-10-CM | POA: Diagnosis not present

## 2016-11-20 DIAGNOSIS — M7989 Other specified soft tissue disorders: Secondary | ICD-10-CM | POA: Diagnosis not present

## 2016-11-20 DIAGNOSIS — M79671 Pain in right foot: Secondary | ICD-10-CM

## 2016-11-20 MED ORDER — INDOMETHACIN 50 MG PO CAPS: 50.0000 mg | ORAL_CAPSULE | Freq: Three times a day (TID) | ORAL | 1 refills | Status: DC

## 2016-11-20 NOTE — Progress Notes (Signed)
PRIMARY CARE AT [facility] [address], [place] [address] [phone]  Date:  11/20/2016   Name:  Hector [name]   DOB:  May 02, 1956   MRN:  [id number]  PCP:  Patient, No Pcp Per    History of Present Illness:  Hector [name] is a 61 y.o. male patient who presents to PCP with  Chief Complaint  Patient presents with  . Gout    Right foot - began Sunday      4 days ago the pain started at the medial side of the 1st toe.  Radiating up into his foot area.  The swelling had occurred, but it has improved.  He was given colchicine the next day.  The pain has not improved at all.  He had no trauma of the foot.  No heavy ambulation just prior to symptoms.  No fever.  No cuts or drainage.     Patient Active Problem List   Diagnosis Date Noted  . Tachycardia 04/06/2013  . Osteoarthritis of left knee 01/28/2013  . ALCOHOL ABUSE 01/04/2007  . TOBACCO ABUSE 01/04/2007  . URI 01/04/2007  . GERD 01/04/2007  . WRIST PAIN 01/04/2007  . FOOT PAIN 01/04/2007  . VOMITING 01/04/2007  . PREHYPERTENSION 01/04/2007    Past Medical History:  Diagnosis Date  . Arthritis   . GERD (gastroesophageal reflux disease)   . Gout    L knee    Past Surgical History:  Procedure Laterality Date  . FACIAL COSMETIC SURGERY     due ot shattered bones   . right hand fracture    . TOTAL KNEE ARTHROPLASTY Left 04/04/2013   Procedure: LEFT TOTAL KNEE ARTHROPLASTY;  Surgeon: Jacki Cones, MD;  Location: [facility];  Service: Orthopedics;  Laterality: Left;    Social History  Substance Use Topics  . Smoking status: Current Every Day Smoker    Packs/day: 1.50    Years: 39.00    Types: Cigarettes  . Smokeless tobacco: Never Used  . Alcohol use 7.2 oz/week    12 Cans of beer per week    Family History  Problem Relation Age of Onset  . Cancer Mother     No Known Allergies  Medication list has been reviewed and updated.  Current Outpatient Prescriptions on File Prior to Visit  Medication Sig  Dispense Refill  . allopurinol (ZYLOPRIM) 300 MG tablet Take 1 tablet (300 mg total) by mouth daily. (Patient not taking: Reported on 11/20/2016)    . Alum & Mag Hydroxide-Simeth (MAGIC MOUTHWASH W/LIDOCAINE) SOLN Take 10 mLs by mouth every 2 (two) hours as needed for mouth pain. (Patient not taking: Reported on 11/20/2016) 360 mL 0  . colchicine 0.6 MG tablet Take 1 tablet (0.6 mg total) by mouth 2 (two) times daily. (Patient not taking: Reported on 11/20/2016) 10 tablet 0  . ipratropium (ATROVENT) 0.03 % nasal spray Place 2 sprays into both nostrils 2 (two) times daily. (Patient not taking: Reported on 11/20/2016) 30 mL 0  . ranitidine (ZANTAC) 150 MG tablet Take 150 mg by mouth 2 (two) times daily as needed for heartburn (for acid reflux).     No current facility-administered medications on file prior to visit.     ROS ROS otherwise unremarkable unless listed above.  Physical Examination: BP 110/72   Pulse 98   Temp 98.4 F (36.9 C) (Oral)   Resp 16   Ht 5\' 9"  (1.753 m)   Wt 176 lb 3.2 oz (79.9 kg)   SpO2  100%   BMI 26.02 kg/m  Ideal Body Weight: Weight in (lb) to have BMI = 25: 168.9  Physical Exam  Constitutional: He is oriented to person, place, and time. He appears well-developed and well-nourished. No distress.  HENT:  Head: Normocephalic and atraumatic.  Eyes: Conjunctivae and EOM are normal. Pupils are equal, round, and reactive to light.  Cardiovascular: Normal rate.   Pulmonary/Chest: Effort normal. No respiratory distress.  Musculoskeletal:  Right medial mcp with tenderness, and along the metarsal of the 1st and 2nd.  rom appears normal with good resisted strength.  No swelling or erythema  Neurological: He is alert and oriented to person, place, and time.  Skin: Skin is warm and dry. He is not diaphoretic.  Psychiatric: He has a normal mood and affect. His behavior is normal.    Dg Foot Complete Right  Result Date: 11/20/2016 CLINICAL DATA:  Pain and swelling for 5  days EXAM: RIGHT FOOT COMPLETE - 3+ VIEW COMPARISON:  March 29, 2006 FINDINGS: Frontal, oblique, and lateral views were obtained. There is no evident acute fracture or dislocation. Suspect old fractures of the mid portions of the second and third proximal phalanges with remodeling, stable. There is early bunion formation medial to the first MTP joint. There is no appreciable joint space narrowing or erosion. There is a small inferior calcaneal spur. IMPRESSION: No acute fracture or dislocation. No appreciable joint space narrowing. Early bunion formation medial to the first MTP joint. There is a small inferior calcaneal spur. Electronically Signed   By: Bretta BangWilliam  Woodruff III M.D.   On: 11/20/2016 08:44     Assessment and Plan: Hector [name] is a 61 y.o. male who is here today for right foot pain. Gout vs tendinopathy Icing and nsaid.  Return in 10 days if symptoms do not improve.  Acute gout of right foot, unspecified cause - Plan: indomethacin (INDOCIN) 50 MG capsule  Right foot pain - Plan: DG Foot Complete Right  Trena PlattStephanie Debe Anfinson, PA-C [facility] 5/20/20188:26 AM

## 2016-11-20 NOTE — Patient Instructions (Addendum)
You can continue to take colchicine .6mg  daily.   Take the indomethacin as prescribed. You can try icing the foot at least 3 times per day for 15-20 minutes.     Gout Gout is painful swelling that can happen in some of your joints. Gout is a type of arthritis. This condition is caused by having too much uric acid in your body. Uric acid is a chemical that is made when your body breaks down substances called purines. If your body has too much uric acid, sharp crystals can form and build up in your joints. This causes pain and swelling. Gout attacks can happen quickly and be very painful (acute gout). Over time, the attacks can affect more joints and happen more often (chronic gout). Follow these instructions at home: During a Gout Attack   If directed, put ice on the painful area:  Put ice in a plastic bag.  Place a towel between your skin and the bag.  Leave the ice on for 20 minutes, 2-3 times a day.  Rest the joint as much as possible. If the joint is in your leg, you may be given crutches to use.  Raise (elevate) the painful joint above the level of your heart as often as you can.  Drink enough fluids to keep your pee (urine) clear or pale yellow.  Take over-the-counter and prescription medicines only as told by your doctor.  Do not drive or use heavy machinery while taking prescription pain medicine.  Follow instructions from your doctor about what you can or cannot eat and drink.  Return to your normal activities as told by your doctor. Ask your doctor what activities are safe for you. Avoiding Future Gout Attacks   Follow a low-purine diet as told by a specialist (dietitian) or your doctor. Avoid foods and drinks that have a lot of purines, such as:  Liver.  Kidney.  Anchovies.  Asparagus.  Herring.  Mushrooms  Mussels.  Beer.  Limit alcohol intake to no more than 1 drink a day for nonpregnant women and 2 drinks a day for men. One drink equals 12 oz of beer,  5 oz of wine, or 1 oz of hard liquor.  Stay at a healthy weight or lose weight if you are overweight. If you want to lose weight, talk with your doctor. It is important that you do not lose weight too fast.  Start or continue an exercise plan as told by your doctor.  Drink enough fluids to keep your pee clear or pale yellow.  Take over-the-counter and prescription medicines only as told by your doctor.  Keep all follow-up visits as told by your doctor. This is important. Contact a doctor if:  You have another gout attack.  You still have symptoms of a gout attack after10 days of treatment.  You have problems (side effects) because of your medicines.  You have chills or a fever.  You have burning pain when you pee (urinate).  You have pain in your lower back or belly. Get help right away if:  You have very bad pain.  Your pain cannot be controlled.  You cannot pee. This information is not intended to replace advice given to you by your health care provider. Make sure you discuss any questions you have with your health care provider. Document Released: 03/31/2008 Document Revised: 11/28/2015 Document Reviewed: 04/04/2015 Elsevier Interactive Patient Education  2017 [employer]     IF you received an x-ray today, you will receive an  invoice from [facility]. Please contact [facility] at [phone] with questions or concerns regarding your invoice.   IF you received labwork today, you will receive an invoice from [facility]. Please contact [facility] at [phone] with questions or concerns regarding your invoice.   Our billing staff will not be able to assist you with questions regarding bills from these companies.  You will be contacted with the lab results as soon as they are available. The fastest way to get your results is to activate your My Chart account. Instructions are located on the last page of this paperwork. If you have not heard from [place]  regarding the results in 2 weeks, please contact this office.

## 2017-01-19 ENCOUNTER — Encounter: Payer: Self-pay | Admitting: Physician Assistant

## 2017-01-19 ENCOUNTER — Ambulatory Visit (INDEPENDENT_AMBULATORY_CARE_PROVIDER_SITE_OTHER): Payer: BLUE CROSS/BLUE SHIELD | Admitting: Physician Assistant

## 2017-01-19 VITALS — BP 136/78 | HR 89 | Temp 97.3°F | Resp 18 | Ht 69.0 in | Wt 180.0 lb

## 2017-01-19 DIAGNOSIS — S29019A Strain of muscle and tendon of unspecified wall of thorax, initial encounter: Secondary | ICD-10-CM

## 2017-01-19 DIAGNOSIS — M62838 Other muscle spasm: Secondary | ICD-10-CM

## 2017-01-19 MED ORDER — MELOXICAM 7.5 MG PO TABS: 7.5000 mg | ORAL_TABLET | Freq: Every day | ORAL | 0 refills | Status: DC

## 2017-01-19 MED ORDER — CYCLOBENZAPRINE HCL 5 MG PO TABS: 5.0000 mg | ORAL_TABLET | Freq: Three times a day (TID) | ORAL | 0 refills | Status: DC | PRN

## 2017-01-19 NOTE — Patient Instructions (Addendum)
     IF you received an x-ray today, you will receive an invoice from [facility]. Please contact [facility] at [phone] with questions or concerns regarding your invoice.   IF you received labwork today, you will receive an invoice from [facility]. Please contact [facility] at [phone] with questions or concerns regarding your invoice.   Our billing staff will not be able to assist you with questions regarding bills from these companies.  You will be contacted with the lab results as soon as they are available. The fastest way to get your results is to activate your My Chart account. Instructions are located on the last page of this paperwork. If you have not heard from us regarding the results in 2 weeks, please contact this office.     Thoracic Strain Thoracic strain is an injury to the muscles or tendons that attach to the upper back. A strain can be mild or severe. A mild strain may take only 1-2 weeks to heal. A severe strain involves torn muscles or tendons, so it may take 6-8 weeks to heal. Follow these instructions at home:  Rest as needed. Limit your activity as told by your doctor.  If directed, put ice on the injured area: ? Put ice in a plastic bag. ? Place a towel between your skin and the bag. ? Leave the ice on for 20 minutes, 2-3 times per day.  Take over-the-counter and prescription medicines only as told by your doctor.  Begin doing exercises as told by your doctor or physical therapist.  Warm up before being active.  Bend your knees before you lift heavy objects.  Keep all follow-up visits as told by your doctor. This is important. Contact a doctor if:  Your pain is not helped by medicine.  Your pain, bruising, or swelling is getting worse.  You have a fever. Get help right away if:  You have shortness of breath.  You have chest pain.  You have weakness or loss of feeling (numbness) in your legs.  You cannot control when you  pee (urinate). This information is not intended to replace advice given to you by your health care provider. Make sure you discuss any questions you have with your health care provider. Document Released: 12/09/2007 Document Revised: 02/22/2016 Document Reviewed: 08/16/2014 Elsevier Interactive Patient Education  2018 [employer] Inc.  

## 2017-01-19 NOTE — Progress Notes (Signed)
Hector [name]  MRN: [id number] DOB: 05/17/1956  PCP: Patient, No Pcp Per  Chief Complaint  Patient presents with  . Back Pain    left lower side started this morning     Subjective:  Pt presents to clinic for right sided back/flank pain that he did not wake up with but as his day progressed it started - he was at work and he wonders if her moves some shingles prior to it starting..  The pain has not changed since it started.  The pain is sharp and gets worse with movement.  He has never had this type of pain before.  He has noticed no change in his pain with urination.  He took some ASA but it did not help the pain.  The pain does not radiate and no weakness or paresthesias.   History is obtained by patient.  Review of Systems  Genitourinary: Negative.   Musculoskeletal: Positive for back pain. Negative for gait problem.  Neurological: Negative for weakness and numbness.    Patient Active Problem List   Diagnosis Date Noted  . Tachycardia 04/06/2013  . Osteoarthritis of left knee 01/28/2013  . ALCOHOL ABUSE 01/04/2007  . TOBACCO ABUSE 01/04/2007  . URI 01/04/2007  . GERD 01/04/2007  . WRIST PAIN 01/04/2007  . FOOT PAIN 01/04/2007  . VOMITING 01/04/2007  . PREHYPERTENSION 01/04/2007    Current Outpatient Prescriptions on File Prior to Visit  Medication Sig Dispense Refill  . amLODipine (NORVASC) 10 MG tablet Take 10 mg by mouth daily.    . indomethacin (INDOCIN) 50 MG capsule Take 1 capsule (50 mg total) by mouth 3 (three) times daily with meals. 30 capsule 1  . lisinopril-hydrochlorothiazide (PRINZIDE,ZESTORETIC) 20-12.5 MG tablet Take 1 tablet by mouth daily.    . ranitidine (ZANTAC) 150 MG tablet Take 150 mg by mouth 2 (two) times daily as needed for heartburn (for acid reflux).     No current facility-administered medications on file prior to visit.     No Known Allergies  Past Medical History:  Diagnosis Date  . Arthritis   . GERD (gastroesophageal reflux  disease)   . 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 Smoking status: Current Every Day Smoker    Packs/day: 1.50    Years: 39.00    Types: Cigarettes  . Smokeless tobacco: Never Used  . Alcohol use 7.2 oz/week    12 Cans of beer per week   family history includes Cancer in his mother.     Objective:  BP 136/78   Pulse 89   Temp (!) 97.3 F (36.3 C) (Oral)   Resp 18   Ht 5\' 9"  (1.753 m)   Wt 180 lb (81.6 kg)   SpO2 94%   BMI 26.58 kg/m  Body mass index is 26.58 kg/m.  Physical Exam  Constitutional: He is oriented to person, place, and time and well-developed, well-nourished, and in no distress.  HENT:  Head: Normocephalic and atraumatic.  Right Ear: External ear normal.  Left Ear: External ear normal.  Eyes: Conjunctivae are normal.  Neck: Normal range of motion.  Pulmonary/Chest: Effort normal.  Musculoskeletal:       Thoracic back: He exhibits tenderness, pain and spasm. He exhibits normal range of motion (FROM but pain with extension and right lateral flexion ).       Lumbar  back: He exhibits tenderness, pain and spasm. He exhibits normal range of motion.       Back:  Neurological: He is alert and oriented to person, place, and time. He has normal sensation, normal strength and normal reflexes. He displays no weakness and normal reflexes. He has a normal Straight Leg Raise Test. Gait normal. Gait normal.  Skin: Skin is warm and dry.  Psychiatric: Mood, memory, affect and judgment normal.    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  Recheck  if not better in a week sooner if worse - warning signs d/w pt.  Benny Lennert PA-C  Primary [facility] at [facility] 01/19/2017 11:10 AM

## 2017-01-30 ENCOUNTER — Other Ambulatory Visit: Payer: Self-pay | Admitting: Physician Assistant

## 2017-01-30 DIAGNOSIS — S29019A Strain of muscle and tendon of unspecified wall of thorax, initial encounter: Secondary | ICD-10-CM

## 2017-02-15 DIAGNOSIS — I10 Essential (primary) hypertension: Secondary | ICD-10-CM | POA: Diagnosis not present

## 2017-02-15 DIAGNOSIS — M130 Polyarthritis, unspecified: Secondary | ICD-10-CM | POA: Diagnosis not present

## 2017-02-15 DIAGNOSIS — M10011 Idiopathic gout, right shoulder: Secondary | ICD-10-CM | POA: Diagnosis not present

## 2017-02-21 ENCOUNTER — Other Ambulatory Visit: Payer: Self-pay | Admitting: Physician Assistant

## 2017-02-21 DIAGNOSIS — S29019A Strain of muscle and tendon of unspecified wall of thorax, initial encounter: Secondary | ICD-10-CM

## 2017-02-23 NOTE — Telephone Encounter (Signed)
Needs an appt

## 2017-02-23 NOTE — Telephone Encounter (Signed)
Please advise 

## 2017-03-30 ENCOUNTER — Other Ambulatory Visit: Payer: Self-pay

## 2017-03-30 ENCOUNTER — Ambulatory Visit (INDEPENDENT_AMBULATORY_CARE_PROVIDER_SITE_OTHER): Payer: BLUE CROSS/BLUE SHIELD

## 2017-03-30 ENCOUNTER — Encounter: Payer: Self-pay | Admitting: Family Medicine

## 2017-03-30 ENCOUNTER — Ambulatory Visit (INDEPENDENT_AMBULATORY_CARE_PROVIDER_SITE_OTHER): Payer: BLUE CROSS/BLUE SHIELD | Admitting: Family Medicine

## 2017-03-30 VITALS — BP 138/88 | HR 96 | Temp 98.2°F | Resp 17 | Ht 69.0 in | Wt 174.0 lb

## 2017-03-30 DIAGNOSIS — M109 Gout, unspecified: Secondary | ICD-10-CM | POA: Diagnosis not present

## 2017-03-30 DIAGNOSIS — Z7289 Other problems related to lifestyle: Secondary | ICD-10-CM

## 2017-03-30 DIAGNOSIS — M25561 Pain in right knee: Secondary | ICD-10-CM

## 2017-03-30 DIAGNOSIS — Z8739 Personal history of other diseases of the musculoskeletal system and connective tissue: Secondary | ICD-10-CM

## 2017-03-30 DIAGNOSIS — M25531 Pain in right wrist: Secondary | ICD-10-CM

## 2017-03-30 DIAGNOSIS — Z789 Other specified health status: Secondary | ICD-10-CM

## 2017-03-30 MED ORDER — TRAMADOL HCL 50 MG PO TABS: 50.0000 mg | ORAL_TABLET | Freq: Three times a day (TID) | ORAL | 0 refills | Status: DC | PRN

## 2017-03-30 MED ORDER — ALLOPURINOL 300 MG PO TABS: 300.0000 mg | ORAL_TABLET | Freq: Every day | ORAL | 1 refills | Status: DC

## 2017-03-30 MED ORDER — INDOMETHACIN 50 MG PO CAPS: 50.0000 mg | ORAL_CAPSULE | Freq: Three times a day (TID) | ORAL | 1 refills | Status: DC

## 2017-03-30 NOTE — Progress Notes (Signed)
Subjective:  By signing my name below, I, Essence Howell, attest that this documentation has been prepared under the direction and in the presence of Shade Flood, MD Electronically Signed: Charline Bills, ED Scribe 03/30/2017 at 59:17 PM.   Patient ID: Hector [name], male    DOB: [birth year], 61 y.o.   MRN: [id number]  Chief Complaint  Patient presents with  . Gout    knee swelling    HPI Hector [name] is a 61 y.o. male who presents to Primary Care at [facility] complaining of knee swelling onset 9 PM lats night. H/o multiple medical problems including gout last treated May 18. Initially treated with colchicine at that time. Had not been taking allopurinol although previously prescribed. Started on indomethacin 50 mg tid and continued on colchicine. He has not had a recent uric acid; last listed was 8.4 in 2014.   Pt reports sudden onset of right knee pain and swelling onset 9 PM last night. He does report consuming a large bottle of V8 juice all day yesterday. Pt is ambulating with crutches due to difficulty bending the right knee. No medications tried PTA. He states that he stopped allopurinol because he ran out of the medication and was unaware that he could get a refill. Reports indomethacin worked well last flare-up but he ran out. Pt reports consuming a 12 pack and a fifth of liquor on the weekends. Denies fever, fall or injury, alcohol addiction, DUIs, issues at work or home due to alcohol. No h/o stomach ulcer. 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    R Wrist Pain Pt also reports right wrist pain for ~1 months. No treatments tried PTA. Denies fall or injury. Pt is left hand dominant.   Patient Active Problem List   Diagnosis Date Noted  . Tachycardia 04/06/2013  . Osteoarthritis of left knee 01/28/2013  . ALCOHOL ABUSE 01/04/2007  . TOBACCO ABUSE 01/04/2007  . URI 01/04/2007  . GERD 01/04/2007  . WRIST PAIN 01/04/2007  . FOOT PAIN 01/04/2007  . VOMITING 01/04/2007  . PREHYPERTENSION  01/04/2007   Past Medical History:  Diagnosis Date  . Arthritis   . GERD (gastroesophageal reflux disease)   . Gout    L knee   Past Surgical History:  Procedure Laterality Date  . FACIAL COSMETIC SURGERY     due ot shattered bones   . right hand fracture    . TOTAL KNEE ARTHROPLASTY Left 04/04/2013   Procedure: LEFT TOTAL KNEE ARTHROPLASTY;  Surgeon: Jacki Cones, MD;  Location: [facility];  Service: Orthopedics;  Laterality: Left;   No Known Allergies Prior to Admission medications   Medication Sig Start Date End Date Taking? Authorizing Provider  amLODipine (NORVASC) 10 MG tablet Take 10 mg by mouth daily.   Yes [provider]  cyclobenzaprine (FLEXERIL) 5 MG tablet Take 1 tablet (5 mg total) by mouth 3 (three) times daily as needed for muscle spasms. 01/19/17  Yes Weber, Sarah L, PA-C  indomethacin (INDOCIN) 50 MG capsule Take 1 capsule (50 mg total) by mouth 3 (three) times daily with meals. 11/20/16  Yes English, Judeth Cornfield D, PA  lisinopril-hydrochlorothiazide (PRINZIDE,ZESTORETIC) 20-12.5 MG tablet Take 1 tablet by mouth daily.   Yes [provider]  meloxicam (MOBIC) 7.5 MG tablet Take 1-2 tablets (7.5-15 mg total) by mouth daily. 01/19/17  Yes Weber, Dema Severin, PA-C  ranitidine (ZANTAC) 150 MG tablet Take 150 mg by mouth 2 (two) times daily as needed for heartburn (for acid reflux).  Yes [provider]   Social History   Social History  . Marital status: Single    Spouse name: N/A  . Number of children: N/A  . Years of education: N/A   Occupational History  . roofer    Social History Main Topics  . Smoking status: Current Every Day Smoker    Packs/day: 1.50    Years: 39.00    Types: Cigarettes  . Smokeless tobacco: Never Used  . Alcohol use 7.2 oz/week    12 Cans of beer per week  . Drug use: No  . Sexual activity: No   Other Topics Concern  . 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  Musculoskeletal: Positive for arthralgias and joint swelling.      Objective:   Physical Exam  Constitutional: He is oriented to person, place, and time. He appears well-developed and well-nourished. No distress.  HENT:  Head: Normocephalic and atraumatic.  Eyes: Conjunctivae and EOM are normal.  Neck: Neck supple. No tracheal deviation present.  Cardiovascular: Normal rate.   Pulmonary/Chest: Effort normal. No respiratory distress.  Musculoskeletal: Normal range of motion.  R Knee: Patellar and patellar tendon nontender. Moderate effusion into R knee. Pain with motion. Tender along lateral aspect of R knee. Able to extended leg with minimal decreased extension ~10 degrees. 90 degrees of flexion. Slight tenderness along lateral joint line. Skin is intact. R Wrist: pain with wrist flexion. Otherwise full ROM. Scaphoid is nontender. Tenderness at proximal carpal wrist.  Neurological: He is alert and oriented to person, place, and time.  Skin: Skin is warm and dry.  Psychiatric: He has a normal mood and affect. His behavior is normal.  Nursing note and vitals reviewed.  Vitals:   03/30/17 1143  BP: 138/88  Pulse: 96  Resp: 17  Temp: 98.2 F (36.8 C)  TempSrc: Oral  SpO2: 98%  Weight: 174 lb (78.9 kg)  Height:  (1.753 m)  Dg Wrist Complete Right  Result Date: 03/30/2017 CLINICAL DATA:  Right wrist pain for 1 month.  No known injury. EXAM: RIGHT WRIST - COMPLETE 3+ VIEW COMPARISON:  No recent. FINDINGS: Plate and screw fixation right fourth metacarpal. Hardware intact. Deformity noted of the right fifth metacarpal consistent with old healed fracture. Mild degenerative changes about the right wrist. Subchondral cysts present are most likely degenerative. No acute bony abnormality identified. IMPRESSION: 1. Plate and screw fixation right fourth metacarpal. Hardware intact. Deformity right fifth metacarpal consistent old healed fracture.  2. Mild degenerative changes about the right wrist. No acute abnormality. Electronically Signed   By: Maisie Fus  Register   On: 03/30/2017 12:57       Assessment & Plan:    Hector [name] is a 61 y.o. male Acute pain of right knee - Plan: traMADol (ULTRAM) 50 MG tablet History of gout Acute gout of right knee, unspecified cause - Plan: indomethacin (INDOCIN) 50 MG capsule  - appears consistent with likely gout flare as no known injury.  -restart indomethacin, short course tramadol as needed, rtc precautions.   -decrease alcohol use - let me know if difficulty cutting back or addiction sx's.   -Restart allopurinol after current flare has resolved, then rtc for repeat uric acid in next few months.  -Consider change of HCTZ if not improving.  Right wrist pain - Plan: DG Wrist Complete Right  - some degenerative changes, no apparent  necrosis or fx of carpals.  - indomethacin as above and rtc precautions if not improving.   Alcohol use  - effect on gout discussed - cut back as above.   Meds ordered this encounter  Medications  . indomethacin (INDOCIN) 50 MG capsule    Sig: Take 1 capsule (50 mg total) by mouth 3 (three) times daily with meals.    Dispense:  30 capsule    Refill:  1  . traMADol (ULTRAM) 50 MG tablet    Sig: Take 1 tablet (50 mg total) by mouth every 8 (eight) hours as needed.    Dispense:  20 tablet    Refill:  0  . allopurinol (ZYLOPRIM) 300 MG tablet    Sig: Take 1 tablet (300 mg total) by mouth daily. Start AFTER flare of gout has resolved.    Dispense:  90 tablet    Refill:  1   Patient Instructions   For current gout flare, indomethacin 3 times per day as needed, tramadol if needed for more severe pain temporarily. Do not drive or operate machinery-year-old takes no medication and do not combine with other sedating medicines or alcohol. Do not drink alcohol while taking indomethacin. If knee pain is not improving in the next 1 week, or worsening sooner including  fevers or spreading redness, return here or emergency room.  In next few weeks, as knee pain is resolved, restart allopurinol once per day.  Additionally, will need to cut back on alcohol as that can worsen gout.   Recheck in 2 months to repeat uric acid test to determine if other treatment needed or dose change needed for gout.   Indomethacin should also help wrist pain, I will check an x-ray and we can follow-up to discuss other treatment or workup for that wrist pain if needed based on x-ray.  Return to the clinic or go to the nearest emergency room if any of your symptoms worsen or new symptoms occur.   Gout Gout is painful swelling that can occur in some of your joints. Gout is a type of arthritis. This condition is caused by having too much uric acid in your body. Uric acid is a chemical that forms when your body breaks down substances called purines. Purines are important for building body proteins. When your body has too much uric acid, sharp crystals can form and build up inside your joints. This causes pain and swelling. Gout attacks can happen quickly and be very painful (acute gout). Over time, the attacks can affect more joints and become more frequent (chronic gout). Gout can also cause uric acid to build up under your skin and inside your kidneys. What are the causes? This condition is caused by too much uric acid in your blood. This can occur because:  Your kidneys do not remove enough uric acid from your blood. This is the most common cause.  Your body makes too much uric acid. This can occur with some cancers and cancer treatments. It can also occur if your body is breaking down too many red blood cells (hemolytic anemia).  You eat too many foods that are high in purines. These foods include organ meats and some seafood. Alcohol, especially beer, is also high in purines.  A gout attack may be triggered by trauma or stress. What increases the risk? This condition is more  likely to develop in people who:  Have a family history of gout.  Are male and middle-aged.  Are male and have gone  through menopause.  Are obese.  Frequently drink alcohol, especially beer.  Are dehydrated.  Lose weight too quickly.  Have an organ transplant.  Have lead poisoning.  Take certain medicines, including aspirin, cyclosporine, diuretics, levodopa, and niacin.  Have kidney disease or psoriasis.  What are the signs or symptoms? An attack of acute gout happens quickly. It usually occurs in just one joint. The most common place is the big toe. Attacks often start at night. Other joints that may be affected include joints of the feet, ankle, knee, fingers, wrist, or elbow. Symptoms may include:  Severe pain.  Warmth.  Swelling.  Stiffness.  Tenderness. The affected joint may be very painful to touch.  Shiny, red, or purple skin.  Chills and fever.  Chronic gout may cause symptoms more frequently. More joints may be involved. You may also have white or yellow lumps (tophi) on your hands or feet or in other areas near your joints. How is this diagnosed? This condition is diagnosed based on your symptoms, medical history, and physical exam. You may have tests, such as:  Blood tests to measure uric acid levels.  Removal of joint fluid with a needle (aspiration) to look for uric acid crystals.  X-rays to look for joint damage.  How is this treated? Treatment for this condition has two phases: treating an acute attack and preventing future attacks. Acute gout treatment may include medicines to reduce pain and swelling, including:  NSAIDs.  Steroids. These are strong anti-inflammatory medicines that can be taken by mouth (orally) or injected into a joint.  Colchicine. This medicine relieves pain and swelling when it is taken soon after an attack. It can be given orally or through an IV tube.  Preventive treatment may include:  Daily use of smaller doses  of NSAIDs or colchicine.  Use of a medicine that reduces uric acid levels in your blood.  Changes to your diet. You may need to see a specialist about healthy eating (dietitian).  Follow these instructions at home: During a Gout Attack  If directed, apply ice to the affected area: ? Put ice in a plastic bag. ? Place a towel between your skin and the bag. ? Leave the ice on for 20 minutes, 2-3 times a day.  Rest the joint as much as possible. If the affected joint is in your leg, you may be given crutches to use.  Raise (elevate) the affected joint above the level of your heart as often as possible.  Drink enough fluids to keep your urine clear or pale yellow.  Take over-the-counter and prescription medicines only as told by your health care provider.  Do not drive or operate heavy machinery while taking prescription pain medicine.  Follow instructions from your health care provider about eating or drinking restrictions.  Return to your normal activities as told by your health care provider. Ask your health care provider what activities are safe for you. Avoiding Future Gout Attacks  Follow a low-purine diet as told by your dietitian or health care provider. Avoid foods and drinks that are high in purines, including liver, kidney, anchovies, asparagus, herring, mushrooms, mussels, and beer.  Limit alcohol intake to no more than 1 drink a day for nonpregnant women and 2 drinks a day for men. One drink equals 12 oz of beer, 5 oz of wine, or 1 oz of hard liquor.  Maintain a healthy weight or lose weight if you are overweight. If you want to lose weight, talk with your  health care provider. It is important that you do not lose weight too quickly.  Start or maintain an exercise program as told by your health care provider.  Drink enough fluids to keep your urine clear or pale yellow.  Take over-the-counter and prescription medicines only as told by your health care provider.  Keep  all follow-up visits as told by your health care provider. This is important. Contact a health care provider if:  You have another gout attack.  You continue to have symptoms of a gout attack after10 days of treatment.  You have side effects from your medicines.  You have chills or a fever.  You have burning pain when you urinate.  You have pain in your lower back or belly. Get help right away if:  You have severe or uncontrolled pain.  You cannot urinate. This information is not intended to replace advice given to you by your health care provider. Make sure you discuss any questions you have with your health care provider. Document Released: 06/19/2000 Document Revised: 11/28/2015 Document Reviewed: 04/04/2015 Elsevier Interactive Patient Education  2017 [employer].      IF you received an x-ray today, you will receive an invoice from [facility]. Please contact [facility] at [phone] with questions or concerns regarding your invoice.   IF you received labwork today, you will receive an invoice from [facility]. Please contact [facility] at [phone] with questions or concerns regarding your invoice.   Our billing staff will not be able to assist you with questions regarding bills from these companies.  You will be contacted with the lab results as soon as they are available. The fastest way to get your results is to activate your My Chart account. Instructions are located on the last page of this paperwork. If you have not heard from [place] regarding the results in 2 weeks, please contact this office.       I personally performed the services described in this documentation, which was scribed in my presence. The recorded information has been reviewed and considered for accuracy and completeness, addended by me as needed, and agree with information above.  Signed,   Meredith Staggers, MD Primary Care at [facility].  03/31/17 2:56  PM

## 2017-03-30 NOTE — Progress Notes (Signed)
Rx for Ultram called into [facility] [address].

## 2017-03-30 NOTE — Patient Instructions (Addendum)
For current gout flare, indomethacin 3 times per day as needed, tramadol if needed for more severe pain temporarily. Do not drive or operate machinery-year-old takes no medication and do not combine with other sedating medicines or alcohol. Do not drink alcohol while taking indomethacin. If knee pain is not improving in the next 1 week, or worsening sooner including fevers or spreading redness, return here or emergency room.  In next few weeks, as knee pain is resolved, restart allopurinol once per day.  Additionally, will need to cut back on alcohol as that can worsen gout.   Recheck in 2 months to repeat uric acid test to determine if other treatment needed or dose change needed for gout.   Indomethacin should also help wrist pain, I will check an x-ray and we can follow-up to discuss other treatment or workup for that wrist pain if needed based on x-ray.  Return to the clinic or go to the nearest emergency room if any of your symptoms worsen or new symptoms occur.   Gout Gout is painful swelling that can occur in some of your joints. Gout is a type of arthritis. This condition is caused by having too much uric acid in your body. Uric acid is a chemical that forms when your body breaks down substances called purines. Purines are important for building body proteins. When your body has too much uric acid, sharp crystals can form and build up inside your joints. This causes pain and swelling. Gout attacks can happen quickly and be very painful (acute gout). Over time, the attacks can affect more joints and become more frequent (chronic gout). Gout can also cause uric acid to build up under your skin and inside your kidneys. What are the causes? This condition is caused by too much uric acid in your blood. This can occur because:  Your kidneys do not remove enough uric acid from your blood. This is the most common cause.  Your body makes too much uric acid. This can occur with some cancers and  cancer treatments. It can also occur if your body is breaking down too many red blood cells (hemolytic anemia).  You eat too many foods that are high in purines. These foods include organ meats and some seafood. Alcohol, especially beer, is also high in purines.  A gout attack may be triggered by trauma or stress. What increases the risk? This condition is more likely to develop in people who:  Have a family history of gout.  Are male and middle-aged.  Are male and have gone through menopause.  Are obese.  Frequently drink alcohol, especially beer.  Are dehydrated.  Lose weight too quickly.  Have an organ transplant.  Have lead poisoning.  Take certain medicines, including aspirin, cyclosporine, diuretics, levodopa, and niacin.  Have kidney disease or psoriasis.  What are the signs or symptoms? An attack of acute gout happens quickly. It usually occurs in just one joint. The most common place is the big toe. Attacks often start at night. Other joints that may be affected include joints of the feet, ankle, knee, fingers, wrist, or elbow. Symptoms may include:  Severe pain.  Warmth.  Swelling.  Stiffness.  Tenderness. The affected joint may be very painful to touch.  Shiny, red, or purple skin.  Chills and fever.  Chronic gout may cause symptoms more frequently. More joints may be involved. You may also have white or yellow lumps (tophi) on your hands or feet or in other areas near your  joints. How is this diagnosed? This condition is diagnosed based on your symptoms, medical history, and physical exam. You may have tests, such as:  Blood tests to measure uric acid levels.  Removal of joint fluid with a needle (aspiration) to look for uric acid crystals.  X-rays to look for joint damage.  How is this treated? Treatment for this condition has two phases: treating an acute attack and preventing future attacks. Acute gout treatment may include medicines to  reduce pain and swelling, including:  NSAIDs.  Steroids. These are strong anti-inflammatory medicines that can be taken by mouth (orally) or injected into a joint.  Colchicine. This medicine relieves pain and swelling when it is taken soon after an attack. It can be given orally or through an IV tube.  Preventive treatment may include:  Daily use of smaller doses of NSAIDs or colchicine.  Use of a medicine that reduces uric acid levels in your blood.  Changes to your diet. You may need to see a specialist about healthy eating (dietitian).  Follow these instructions at home: During a Gout Attack  If directed, apply ice to the affected area: ? Put ice in a plastic bag. ? Place a towel between your skin and the bag. ? Leave the ice on for 20 minutes, 2-3 times a day.  Rest the joint as much as possible. If the affected joint is in your leg, you may be given crutches to use.  Raise (elevate) the affected joint above the level of your heart as often as possible.  Drink enough fluids to keep your urine clear or pale yellow.  Take over-the-counter and prescription medicines only as told by your health care provider.  Do not drive or operate heavy machinery while taking prescription pain medicine.  Follow instructions from your health care provider about eating or drinking restrictions.  Return to your normal activities as told by your health care provider. Ask your health care provider what activities are safe for you. Avoiding Future Gout Attacks  Follow a low-purine diet as told by your dietitian or health care provider. Avoid foods and drinks that are high in purines, including liver, kidney, anchovies, asparagus, herring, mushrooms, mussels, and beer.  Limit alcohol intake to no more than 1 drink a day for nonpregnant women and 2 drinks a day for men. One drink equals 12 oz of beer, 5 oz of wine, or 1 oz of hard liquor.  Maintain a healthy weight or lose weight if you are  overweight. If you want to lose weight, talk with your health care provider. It is important that you do not lose weight too quickly.  Start or maintain an exercise program as told by your health care provider.  Drink enough fluids to keep your urine clear or pale yellow.  Take over-the-counter and prescription medicines only as told by your health care provider.  Keep all follow-up visits as told by your health care provider. This is important. Contact a health care provider if:  You have another gout attack.  You continue to have symptoms of a gout attack after10 days of treatment.  You have side effects from your medicines.  You have chills or a fever.  You have burning pain when you urinate.  You have pain in your lower back or belly. Get help right away if:  You have severe or uncontrolled pain.  You cannot urinate. This information is not intended to replace advice given to you by your health care provider. Make  sure you discuss any questions you have with your health care provider. Document Released: 06/19/2000 Document Revised: 11/28/2015 Document Reviewed: 04/04/2015 Elsevier Interactive Patient Education  2017 [employer].      IF you received an x-ray today, you will receive an invoice from [facility]. Please contact [facility] at [phone] with questions or concerns regarding your invoice.   IF you received labwork today, you will receive an invoice from [facility]. Please contact [facility] at [phone] with questions or concerns regarding your invoice.   Our billing staff will not be able to assist you with questions regarding bills from these companies.  You will be contacted with the lab results as soon as they are available. The fastest way to get your results is to activate your My Chart account. Instructions are located on the last page of this paperwork. If you have not heard from [place] regarding the results in 2 weeks, please contact  this office.

## 2017-05-28 ENCOUNTER — Encounter: Payer: Self-pay | Admitting: Physician Assistant

## 2017-05-28 ENCOUNTER — Other Ambulatory Visit: Payer: Self-pay

## 2017-05-28 ENCOUNTER — Ambulatory Visit: Payer: BLUE CROSS/BLUE SHIELD | Admitting: Physician Assistant

## 2017-05-28 VITALS — BP 124/84 | HR 92 | Temp 98.4°F | Resp 18 | Ht 69.0 in | Wt 178.8 lb

## 2017-05-28 DIAGNOSIS — M109 Gout, unspecified: Secondary | ICD-10-CM

## 2017-05-28 DIAGNOSIS — M25571 Pain in right ankle and joints of right foot: Secondary | ICD-10-CM | POA: Diagnosis not present

## 2017-05-28 MED ORDER — TRAMADOL HCL 50 MG PO TABS: 50.0000 mg | ORAL_TABLET | Freq: Three times a day (TID) | ORAL | 0 refills | Status: DC | PRN

## 2017-05-28 MED ORDER — INDOMETHACIN 50 MG PO CAPS: 50.0000 mg | ORAL_CAPSULE | Freq: Three times a day (TID) | ORAL | 1 refills | Status: DC

## 2017-05-28 NOTE — Progress Notes (Signed)
Hector [name]  MRN: [id number] DOB: 05/13/1956  PCP: Patient, No Pcp Per  Subjective:  Pt is a 61 year old male who presents to clinic for foot pain x 2 days. "feels like gout". endorses redness, swelling and warmth. No MOI.  Last gout flare 9/25 and 5/18. He is not taking allopurinol - was Rx this in the past. He was asked to RTC for uric acid level check, however never did so. He usually eats chicken however recently has eaten severl burgers.  Denies decreased ROM, fever, chills, joint pain.   Review of Systems  Constitutional: Negative for chills, diaphoresis, fatigue and fever.  Musculoskeletal: Positive for arthralgias and gait problem.  Neurological: Negative for weakness and numbness.    Patient Active Problem List   Diagnosis Date Noted  . Tachycardia 04/06/2013  . Osteoarthritis of left knee 01/28/2013  . ALCOHOL ABUSE 01/04/2007  . TOBACCO ABUSE 01/04/2007  . URI 01/04/2007  . GERD 01/04/2007  . WRIST PAIN 01/04/2007  . FOOT PAIN 01/04/2007  . VOMITING 01/04/2007  . PREHYPERTENSION 01/04/2007    Current Outpatient Medications on File Prior to Visit  Medication Sig Dispense Refill  . allopurinol (ZYLOPRIM) 300 MG tablet Take 1 tablet (300 mg total) by mouth daily. Start AFTER flare of gout has resolved. 90 tablet 1  . amLODipine (NORVASC) 10 MG tablet Take 10 mg by mouth daily.    . cyclobenzaprine (FLEXERIL) 5 MG tablet Take 1 tablet (5 mg total) by mouth 3 (three) times daily as needed for muscle spasms. 30 tablet 0  . indomethacin (INDOCIN) 50 MG capsule Take 1 capsule (50 mg total) by mouth 3 (three) times daily with meals. 30 capsule 1  . lisinopril-hydrochlorothiazide (PRINZIDE,ZESTORETIC) 20-12.5 MG tablet Take 1 tablet by mouth daily.    . meloxicam (MOBIC) 7.5 MG tablet Take 1-2 tablets (7.5-15 mg total) by mouth daily. 30 tablet 0  . ranitidine (ZANTAC) 150 MG tablet Take 150 mg by mouth 2 (two) times daily as needed for heartburn (for acid reflux).    .  traMADol (ULTRAM) 50 MG tablet Take 1 tablet (50 mg total) by mouth every 8 (eight) hours as needed. 20 tablet 0   No current facility-administered medications on file prior to visit.     No Known Allergies   Objective:  BP 124/84 (BP Location: Right Arm, Patient Position: Sitting, Cuff Size: Normal)   Pulse 92   Temp 98.4 F (36.9 C) (Oral)   Resp 18   Ht 5\' 9"  (1.753 m)   Wt 178 lb 12.8 oz (81.1 kg)   SpO2 93%   BMI 26.40 kg/m   Physical Exam  Constitutional: He is oriented to person, place, and time and well-developed, well-nourished, and in no distress. No distress.  Musculoskeletal:       Right foot: There is normal range of motion, no swelling, normal capillary refill, no deformity and no laceration.       Feet:  Mildly warm to touch. TTP. No erythema, swelling or ecchymosis. Pain elicited with walking.   Neurological: He is alert and oriented to person, place, and time. GCS score is 15.  Skin: Skin is warm and dry.  Psychiatric: Mood, memory, affect and judgment normal.  Vitals reviewed.   Assessment and Plan :  1. Acute gout of right foot, unspecified cause 2. Pain in joint involving right ankle and foot - Uric Acid; Future - indomethacin (INDOCIN) 50 MG capsule; Take 1 capsule (50 mg total) by mouth 3 (  three) times daily with meals.  Dispense: 30 capsule; Refill: 1 - traMADol (ULTRAM) 50 MG tablet; Take 1 tablet (50 mg total) by mouth every 8 (eight) hours as needed.  Dispense: 12 tablet; Refill: 0 - Likely recurrence of gout, no MOI. Pt did not RTC for uric acid level after last OV for gout treatment. Will place future order. This is at least his 3rd OV for gout flare this year. Gouty diet discussed. RTC PRN.   Marco CollieWhitney Laury Huizar, PA-C  Primary [facility] at [facility] 05/28/2017 3:30 PM

## 2017-05-28 NOTE — Patient Instructions (Addendum)
Come back when you are feeling better for a LAB ONLY visit.  See below for foods to help reduce gout flares.  Thank you for coming in today. I hope you feel we met your needs. Feel free to call PCP if you have any questions or further requests. Please consider signing up for MyChart if you do not already have it, as this is a great way to communicate with me.  Best,  Hector McVey, PA-C   Low-Purine Diet Purines are compounds that affect the level of uric acid in your body. A low-purine diet is a diet that is low in purines. Eating a low-purine diet can prevent the level of uric acid in your body from getting too high and causing gout or kidney stones or both. What do I need to know about this diet?  Choose low-purine foods. Examples of low-purine foods are listed in the next section.  Drink plenty of fluids, especially water. Fluids can help remove uric acid from your body. Try to drink 8-16 cups (1.9-3.8 L) a day.  Limit foods high in fat, especially saturated fat, as fat makes it harder for the body to get rid of uric acid. Foods high in saturated fat include pizza, cheese, ice cream, whole milk, fried foods, and gravies. Choose foods that are lower in fat and lean sources of protein. Use olive oil when cooking as it contains healthy fats that are not high in saturated fat.  Limit alcohol. Alcohol interferes with the elimination of uric acid from your body. If you are having a gout attack, avoid all alcohol.  Keep in mind that different people's bodies react differently to different foods. You will probably learn over time which foods do or do not affect you. If you discover that a food tends to cause your gout to flare up, avoid eating that food. You can more freely enjoy foods that do not cause problems. If you have any questions about a food item, talk to your dietitian or health care provider. Which foods are low, moderate, and high in purines? The following is a list of foods that are  low, moderate, and high in purines. You can eat any amount of the foods that are low in purines. You may be able to have small amounts of foods that are moderate in purines. Ask your health care provider how much of a food moderate in purines you can have. Avoid foods high in purines. Grains  Foods low in purines: Enriched white bread, pasta, rice, cake, cornbread, popcorn.  Foods moderate in purines: Whole-grain breads and cereals, wheat germ, bran, oatmeal. Uncooked oatmeal. Dry wheat bran or wheat germ.  Foods high in purines: Pancakes, [place] toast, biscuits, muffins. Vegetables  Foods low in purines: All vegetables, except those that are moderate in purines.  Foods moderate in purines: Asparagus, cauliflower, spinach, mushrooms, green peas. Fruits  All fruits are low in purines. Meats and other Protein Foods  Foods low in purines: Eggs, nuts, peanut butter.  Foods moderate in purines: 80-90% lean beef, lamb, veal, pork, poultry, fish, eggs, peanut butter, nuts. Crab, lobster, oysters, and shrimp. Cooked dried beans, peas, and lentils.  Foods high in purines: Anchovies, sardines, herring, mussels, tuna, codfish, scallops, trout, and haddock. Hector [name]. Organ meats (such as liver or kidney). Tripe. Game meat. Goose. Sweetbreads. Dairy  All dairy foods are low in purines. Low-fat and fat-free dairy products are best because they are low in saturated fat. Beverages  Drinks low in purines: Water, carbonated  beverages, tea, coffee, cocoa.  Drinks moderate in purines: Soft drinks and other drinks sweetened with high-fructose corn syrup. Juices. To find whether a food or drink is sweetened with high-fructose corn syrup, look at the ingredients list.  Drinks high in purines: Alcoholic beverages (such as beer). Condiments  Foods low in purines: Salt, herbs, olives, pickles, relishes, vinegar.  Foods moderate in purines: Butter, margarine, oils, mayonnaise. Fats and Oils  Foods low in  purines: All types, except gravies and sauces made with meat.  Foods high in purines: Gravies and sauces made with meat. Other Foods  Foods low in purines: Sugars, sweets, gelatin. Cake. Soups made without meat.  Foods moderate in purines: Meat-based or fish-based soups, broths, or bouillons. Foods and drinks sweetened with high-fructose corn syrup.  Foods high in purines: High-fat desserts (such as ice cream, cookies, cakes, pies, doughnuts, and chocolate). Contact your dietitian for more information on foods that are not listed here. This information is not intended to replace advice given to you by your health care provider. Make sure you discuss any questions you have with your health care provider. Document Released: 10/17/2010 Document Revised: 11/28/2015 Document Reviewed: 05/29/2013 Elsevier Interactive Patient Education  2017 [employer].   IF you received an x-ray today, you will receive an invoice from [facility]. Please contact [facility] at [phone] with questions or concerns regarding your invoice.   IF you received labwork today, you will receive an invoice from [facility]. Please contact [facility] at [phone] with questions or concerns regarding your invoice.   Our billing staff will not be able to assist you with questions regarding bills from these companies.  You will be contacted with the lab results as soon as they are available. The fastest way to get your results is to activate your My Chart account. Instructions are located on the last page of this paperwork. If you have not heard from [place] regarding the results in 2 weeks, please contact this office.

## 2017-06-17 DIAGNOSIS — I10 Essential (primary) hypertension: Secondary | ICD-10-CM | POA: Diagnosis not present

## 2017-09-26 ENCOUNTER — Other Ambulatory Visit: Payer: Self-pay | Admitting: Family Medicine

## 2017-10-04 ENCOUNTER — Encounter: Payer: Self-pay | Admitting: Physician Assistant

## 2017-10-05 DIAGNOSIS — M109 Gout, unspecified: Secondary | ICD-10-CM | POA: Diagnosis not present

## 2017-10-17 ENCOUNTER — Emergency Department (HOSPITAL_COMMUNITY): Payer: BLUE CROSS/BLUE SHIELD

## 2017-10-17 ENCOUNTER — Encounter (HOSPITAL_COMMUNITY): Payer: Self-pay | Admitting: Emergency Medicine

## 2017-10-17 ENCOUNTER — Emergency Department (HOSPITAL_COMMUNITY)
Admission: EM | Admit: 2017-10-17 | Discharge: 2017-10-17 | Disposition: A | Discharge status: Home / Self Care | Payer: BLUE CROSS/BLUE SHIELD | Attending: Emergency Medicine | Admitting: Emergency Medicine

## 2017-10-17 DIAGNOSIS — I10 Essential (primary) hypertension: Secondary | ICD-10-CM | POA: Diagnosis not present

## 2017-10-17 DIAGNOSIS — F17210 Nicotine dependence, cigarettes, uncomplicated: Secondary | ICD-10-CM | POA: Diagnosis not present

## 2017-10-17 DIAGNOSIS — Z79899 Other long term (current) drug therapy: Secondary | ICD-10-CM | POA: Diagnosis not present

## 2017-10-17 DIAGNOSIS — R0789 Other chest pain: Secondary | ICD-10-CM | POA: Insufficient documentation

## 2017-10-17 DIAGNOSIS — R079 Chest pain, unspecified: Secondary | ICD-10-CM | POA: Diagnosis not present

## 2017-10-17 HISTORY — DX: Essential (primary) hypertension: I10

## 2017-10-17 LAB — BASIC METABOLIC PANEL
Anion gap: 10 (ref 5–15)
BUN: 13 mg/dL (ref 6–20)
CALCIUM: 9.2 mg/dL (ref 8.9–10.3)
CHLORIDE: 106 mmol/L (ref 101–111)
CO2: 22 mmol/L (ref 22–32)
CREATININE: 0.95 mg/dL (ref 0.61–1.24)
Glucose, Bld: 96 mg/dL (ref 65–99)
Potassium: 4.3 mmol/L (ref 3.5–5.1)
SODIUM: 138 mmol/L (ref 135–145)

## 2017-10-17 LAB — I-STAT TROPONIN, ED
TROPONIN I, POC: 0.01 ng/mL (ref 0.00–0.08)
TROPONIN I, POC: 0 ng/mL (ref 0.00–0.08)

## 2017-10-17 LAB — CBC
HCT: 45.5 % (ref 39.0–52.0)
Hemoglobin: 15.2 g/dL (ref 13.0–17.0)
MCH: 28 pg (ref 26.0–34.0)
MCHC: 33.4 g/dL (ref 30.0–36.0)
MCV: 83.9 fL (ref 78.0–100.0)
Platelets: 308 10*3/uL (ref 150–400)
RBC: 5.42 MIL/uL (ref 4.22–5.81)
RDW: 13.5 % (ref 11.5–15.5)
WBC: 9.2 10*3/uL (ref 4.0–10.5)

## 2017-10-17 NOTE — ED Triage Notes (Signed)
Pt c/o intermittent left sided chest pains that started this am. Reports is smoker.

## 2017-10-17 NOTE — ED Provider Notes (Signed)
[facility]-EMERGENCY DEPT Provider Note   CSN: [id number] Arrival date & time: 10/17/17  1540     History   Chief Complaint Chief Complaint  Patient presents with  . Chest Pain    HPI Hector [name] is a 62 y.o. male.  The history is provided by the patient and medical records. No language interpreter was used.  Chest Pain   This is a new problem. The current episode started 6 to 12 hours ago. The problem occurs constantly. The problem has not changed since onset.The pain is associated with movement. The pain is present in the lateral region. The pain is at a severity of 4/10. The pain is moderate. The quality of the pain is described as brief (sharp). The pain does not radiate. The symptoms are aggravated by certain positions. Pertinent negatives include no abdominal pain, no back pain, no claudication, no cough, no diaphoresis, no dizziness, no exertional chest pressure, no fever, no headaches, no hemoptysis, no irregular heartbeat, no leg pain, no lower extremity edema, no malaise/fatigue, no nausea, no near-syncope, no numbness, no orthopnea, no palpitations, no PND, no shortness of breath, no sputum production, no syncope, no vomiting and no weakness. He has tried nothing for the symptoms. Risk factors include smoking/tobacco exposure and male gender.  His past medical history is significant for hyperlipidemia and hypertension.  Pertinent negatives for past medical history include no aneurysm, no CAD, no COPD, no CHF, no PE and no recent injury.   's is a 62 year old gentleman with a past medical history of tobacco and alcohol abuse who presents the emergency department chief complaint of left sided chest pain he states that the pain came on suddenly while at rest this morning.  It is nonpleuritic, worse when he moves his arm.  Lasts seconds at a time.  He has no chest pressure, nausea, vomiting, diaphoresis.  He denies any recent injury or heavy lifting. Past  Medical History:  Diagnosis Date  . Arthritis   . GERD (gastroesophageal reflux disease)   . Gout    L knee  . Hypertension     Patient Active Problem List   Diagnosis Date Noted  . Tachycardia 04/06/2013  . Osteoarthritis of left knee 01/28/2013  . ALCOHOL ABUSE 01/04/2007  . TOBACCO ABUSE 01/04/2007  . URI 01/04/2007  . GERD 01/04/2007  . WRIST PAIN 01/04/2007  . FOOT PAIN 01/04/2007  . VOMITING 01/04/2007  . PREHYPERTENSION 01/04/2007    Past Surgical History:  Procedure Laterality Date  . FACIAL COSMETIC SURGERY     due ot shattered bones   . right hand fracture    . TOTAL KNEE ARTHROPLASTY Left 04/04/2013   Procedure: LEFT TOTAL KNEE ARTHROPLASTY;  Surgeon: Jacki Cones, MD;  Location: [facility] ORS;  Service: Orthopedics;  Laterality: Left;        Home Medications    Prior to Admission medications   Medication Sig Start Date End Date Taking? Authorizing Provider  allopurinol (ZYLOPRIM) 300 MG tablet TAKE 1 TABLET (300 MG TOTAL) BY MOUTH DAILY. START AFTER FLARE OF GOUT HAS RESOLVED. 09/27/17  Yes Shade Flood, MD  amLODipine (NORVASC) 10 MG tablet Take 10 mg by mouth daily.   Yes [provider]  Colchicine 0.6 MG CAPS Take 0.6 mg by mouth daily. 10/05/17  Yes [provider]  lisinopril-hydrochlorothiazide (PRINZIDE,ZESTORETIC) 20-12.5 MG tablet Take 1 tablet by mouth daily.   Yes [provider]  ranitidine (ZANTAC) 150 MG tablet Take 150 mg by  mouth daily.    Yes [provider]  rosuvastatin (CRESTOR) 10 MG tablet Take 10 mg by mouth daily. 10/09/17  Yes [provider]  cyclobenzaprine (FLEXERIL) 5 MG tablet Take 1 tablet (5 mg total) by mouth 3 (three) times daily as needed for muscle spasms. Patient not taking: Reported on 10/17/2017 01/19/17   Valarie ConesWeber, Dema SeverinSarah L, PA-C  indomethacin (INDOCIN) 50 MG capsule Take 1 capsule (50 mg total) by mouth 3 (three) times daily with meals. Patient not taking: Reported on 10/17/2017  05/28/17   McVey, Madelaine BhatElizabeth Whitney, PA-C  meloxicam (MOBIC) 7.5 MG tablet Take 1-2 tablets (7.5-15 mg total) by mouth daily. Patient not taking: Reported on 10/17/2017 01/19/17   Valarie ConesWeber, Dema SeverinSarah L, PA-C  traMADol (ULTRAM) 50 MG tablet Take 1 tablet (50 mg total) by mouth every 8 (eight) hours as needed. Patient not taking: Reported on 10/17/2017 05/28/17   McVey, Madelaine BhatElizabeth Whitney, PA-C    Family History Family History  Problem Relation Age of Onset  . Cancer Mother     Social History Social History   Tobacco Use  . Smoking status: Current Every Day Smoker    Packs/day: 1.50    Years: 39.00    Pack years: 58.50    Types: Cigarettes  . Smokeless tobacco: Never Used  Substance Use Topics  . Alcohol use: Yes    Alcohol/week: 7.2 oz    Types: 12 Cans of beer per week  . Drug use: No     Allergies   Patient has no known allergies.   Review of Systems Review of Systems  Constitutional: Negative for diaphoresis, fever and malaise/fatigue.  Respiratory: Negative for cough, hemoptysis, sputum production and shortness of breath.   Cardiovascular: Positive for chest pain. Negative for palpitations, orthopnea, claudication, syncope, PND and near-syncope.  Gastrointestinal: Negative for abdominal pain, nausea and vomiting.  Musculoskeletal: Negative for back pain.  Neurological: Negative for dizziness, weakness, numbness and headaches.  Ten systems reviewed and are negative for acute change, except as noted in the HPI.     Physical Exam Updated Vital Signs BP 127/78 (BP Location: Right Arm)   Pulse 85   Temp 98.4 F (36.9 C) (Oral)   Resp 18   Ht 5\' 9"  (1.753 m)   Wt 83.5 kg (184 lb)   SpO2 96%   BMI 27.17 kg/m   Physical Exam  Constitutional: He appears well-developed and well-nourished. No distress.  HENT:  Head: Normocephalic and atraumatic.  Eyes: Conjunctivae are normal. No scleral icterus.  Neck: Normal range of motion. Neck supple.  Cardiovascular: Normal rate,  regular rhythm and normal heart sounds.  Pulmonary/Chest: Effort normal and breath sounds normal. No respiratory distress.  Abdominal: Soft. There is no tenderness.  Musculoskeletal: He exhibits no edema.       Right lower leg: Normal.       Left lower leg: Normal.  Neurological: He is alert.  Skin: Skin is warm and dry. No rash noted. He is not diaphoretic.  Psychiatric: His behavior is normal.  Nursing note and vitals reviewed.    ED Treatments / Results  Labs (all labs ordered are listed, but only abnormal results are displayed) Labs Reviewed  BASIC METABOLIC PANEL  CBC  I-STAT TROPONIN, ED  I-STAT TROPONIN, ED    EKG EKG Interpretation  Date/Time:  Sunday October 17 2017 16:20:29 EDT Ventricular Rate:  96 PR Interval:    QRS Duration: 88 QT Interval:  342 QTC Calculation: 433 R Axis:   86 Text  Interpretation:  Sinus rhythm Left atrial enlargement Borderline ST elevation, anterior leads No significant change since last tracing Confirmed by Shaune Pollack [phone]) on 10/17/2017 4:24:49 PM   Radiology Dg Chest 2 View  Result Date: 10/17/2017 CLINICAL DATA:  Left-sided chest pain since this morning. EXAM: CHEST - 2 VIEW COMPARISON:  April 06, 2013 FINDINGS: The heart size and mediastinal contours are within normal limits. Both lungs are clear. The visualized skeletal structures are unremarkable. IMPRESSION: No active cardiopulmonary disease. Electronically Signed   By: Gerome Sam III M.D   On: 10/17/2017 16:57    Procedures Procedures (including critical care time)  Medications Ordered in ED Medications - No data to display   Initial Impression / Assessment and Plan / ED Course  I have reviewed the triage vital signs and the nursing notes.  Pertinent labs & imaging results that were available during my care of the patient were reviewed by me and considered in my medical decision making (see chart for details).    Patient here with atypical chest pain that is  worse with movement of the arm. He has a heart score of 3, 2- troponins, nonischemic EKG without changes from his previous.  No active chest pain at this time.  No tachycardia or concern for pulmonary embolus.  He denies any pleuritic chest pain, hemoptysis. Patient appears appropriate for discharge at this time.  I discussed return precautions.  Final Clinical Impressions(s) / ED Diagnoses   Final diagnoses:  Atypical chest pain    ED Discharge Orders    None       Arthor Captain, PA-C 10/17/17 2302    Lorre Nick, MD 10/17/17 2312

## 2017-10-17 NOTE — Discharge Instructions (Addendum)
You have been diagnosed by your caregiver as having chest wall pain. °SEEK IMMEDIATE MEDICAL ATTENTION IF: °You develop a fever.  °Your chest pains become severe or intolerable.  °You develop new, unexplained symptoms (problems).  °You develop shortness of breath, nausea, vomiting, sweating or feel light headed.  °You develop a new cough or you cough up blood. ° °

## 2017-10-17 NOTE — ED Notes (Signed)
Pt called from the lobby with no response 

## 2017-11-15 DIAGNOSIS — R3912 Poor urinary stream: Secondary | ICD-10-CM | POA: Diagnosis not present

## 2017-11-15 DIAGNOSIS — I10 Essential (primary) hypertension: Secondary | ICD-10-CM | POA: Diagnosis not present

## 2017-11-15 DIAGNOSIS — M130 Polyarthritis, unspecified: Secondary | ICD-10-CM | POA: Diagnosis not present

## 2017-11-15 DIAGNOSIS — N401 Enlarged prostate with lower urinary tract symptoms: Secondary | ICD-10-CM | POA: Diagnosis not present

## 2017-11-15 DIAGNOSIS — M1000 Idiopathic gout, unspecified site: Secondary | ICD-10-CM | POA: Diagnosis not present

## 2017-11-15 DIAGNOSIS — M10071 Idiopathic gout, right ankle and foot: Secondary | ICD-10-CM | POA: Diagnosis not present

## 2018-01-19 DIAGNOSIS — N401 Enlarged prostate with lower urinary tract symptoms: Secondary | ICD-10-CM | POA: Diagnosis not present

## 2018-01-19 DIAGNOSIS — M130 Polyarthritis, unspecified: Secondary | ICD-10-CM | POA: Diagnosis not present

## 2018-01-19 DIAGNOSIS — I10 Essential (primary) hypertension: Secondary | ICD-10-CM | POA: Diagnosis not present

## 2018-01-19 DIAGNOSIS — M10011 Idiopathic gout, right shoulder: Secondary | ICD-10-CM | POA: Diagnosis not present

## 2018-03-20 ENCOUNTER — Other Ambulatory Visit: Payer: Self-pay | Admitting: Family Medicine

## 2018-03-21 NOTE — Telephone Encounter (Signed)
Pt needs Uric Acid drawn; attempted to call, phone disconnected.

## 2018-05-20 DIAGNOSIS — E782 Mixed hyperlipidemia: Secondary | ICD-10-CM | POA: Diagnosis not present

## 2018-05-20 DIAGNOSIS — M130 Polyarthritis, unspecified: Secondary | ICD-10-CM | POA: Diagnosis not present

## 2018-05-20 DIAGNOSIS — Z6826 Body mass index (BMI) 26.0-26.9, adult: Secondary | ICD-10-CM | POA: Diagnosis not present

## 2018-05-20 DIAGNOSIS — R5383 Other fatigue: Secondary | ICD-10-CM | POA: Diagnosis not present

## 2018-05-20 DIAGNOSIS — I10 Essential (primary) hypertension: Secondary | ICD-10-CM | POA: Diagnosis not present

## 2018-06-10 DIAGNOSIS — I10 Essential (primary) hypertension: Secondary | ICD-10-CM | POA: Diagnosis not present

## 2018-06-17 DIAGNOSIS — R6889 Other general symptoms and signs: Secondary | ICD-10-CM | POA: Diagnosis not present

## 2018-06-17 DIAGNOSIS — R5383 Other fatigue: Secondary | ICD-10-CM | POA: Diagnosis not present

## 2018-06-17 DIAGNOSIS — I10 Essential (primary) hypertension: Secondary | ICD-10-CM | POA: Diagnosis not present

## 2018-06-17 DIAGNOSIS — R5381 Other malaise: Secondary | ICD-10-CM | POA: Diagnosis not present

## 2018-07-08 DIAGNOSIS — I70213 Atherosclerosis of native arteries of extremities with intermittent claudication, bilateral legs: Secondary | ICD-10-CM | POA: Diagnosis not present

## 2018-07-08 DIAGNOSIS — I10 Essential (primary) hypertension: Secondary | ICD-10-CM | POA: Diagnosis not present

## 2018-07-11 DIAGNOSIS — R5381 Other malaise: Secondary | ICD-10-CM | POA: Diagnosis not present

## 2018-07-11 DIAGNOSIS — I739 Peripheral vascular disease, unspecified: Secondary | ICD-10-CM | POA: Diagnosis not present

## 2018-07-19 DIAGNOSIS — R6889 Other general symptoms and signs: Secondary | ICD-10-CM | POA: Diagnosis not present

## 2018-07-19 DIAGNOSIS — R5381 Other malaise: Secondary | ICD-10-CM | POA: Diagnosis not present

## 2018-07-19 DIAGNOSIS — R5383 Other fatigue: Secondary | ICD-10-CM | POA: Diagnosis not present

## 2018-07-19 DIAGNOSIS — I739 Peripheral vascular disease, unspecified: Secondary | ICD-10-CM | POA: Diagnosis not present

## 2018-07-22 DIAGNOSIS — M130 Polyarthritis, unspecified: Secondary | ICD-10-CM | POA: Diagnosis not present

## 2018-07-22 DIAGNOSIS — I10 Essential (primary) hypertension: Secondary | ICD-10-CM | POA: Diagnosis not present

## 2018-07-22 DIAGNOSIS — R5383 Other fatigue: Secondary | ICD-10-CM | POA: Diagnosis not present

## 2018-07-22 DIAGNOSIS — I739 Peripheral vascular disease, unspecified: Secondary | ICD-10-CM | POA: Diagnosis not present

## 2018-08-18 ENCOUNTER — Other Ambulatory Visit: Payer: Self-pay | Admitting: Cardiology

## 2018-08-18 DIAGNOSIS — I739 Peripheral vascular disease, unspecified: Secondary | ICD-10-CM

## 2018-08-29 ENCOUNTER — Other Ambulatory Visit: Payer: Self-pay | Admitting: Cardiology

## 2018-08-29 DIAGNOSIS — I739 Peripheral vascular disease, unspecified: Secondary | ICD-10-CM

## 2018-08-29 DIAGNOSIS — I10 Essential (primary) hypertension: Secondary | ICD-10-CM | POA: Diagnosis not present

## 2018-08-30 LAB — CBC WITH DIFFERENTIAL/PLATELET
BASOS ABS: 0.1 10*3/uL (ref 0.0–0.2)
Basos: 1 %
EOS (ABSOLUTE): 0.4 10*3/uL (ref 0.0–0.4)
EOS: 5 %
HEMATOCRIT: 48.7 % (ref 37.5–51.0)
Hemoglobin: 16.1 g/dL (ref 13.0–17.7)
IMMATURE GRANULOCYTES: 1 %
Immature Grans (Abs): 0 10*3/uL (ref 0.0–0.1)
Lymphocytes Absolute: 2.1 10*3/uL (ref 0.7–3.1)
Lymphs: 24 %
MCH: 27.4 pg (ref 26.6–33.0)
MCHC: 33.1 g/dL (ref 31.5–35.7)
MCV: 83 fL (ref 79–97)
MONOCYTES: 10 %
MONOS ABS: 0.8 10*3/uL (ref 0.1–0.9)
Neutrophils Absolute: 5.4 10*3/uL (ref 1.4–7.0)
Neutrophils: 59 %
Platelets: 303 10*3/uL (ref 150–450)
RBC: 5.88 x10E6/uL — ABNORMAL HIGH (ref 4.14–5.80)
RDW: 12.6 % (ref 11.6–15.4)
WBC: 8.9 10*3/uL (ref 3.4–10.8)

## 2018-08-30 LAB — BASIC METABOLIC PANEL
BUN/Creatinine Ratio: 8 — ABNORMAL LOW (ref 10–24)
BUN: 9 mg/dL (ref 8–27)
CALCIUM: 9.6 mg/dL (ref 8.6–10.2)
CHLORIDE: 103 mmol/L (ref 96–106)
CO2: 23 mmol/L (ref 20–29)
Creatinine, Ser: 1.07 mg/dL (ref 0.76–1.27)
GFR calc Af Amer: 87 mL/min/{1.73_m2} (ref >59)
GFR, EST NON AFRICAN AMERICAN: 75 mL/min/{1.73_m2} (ref >59)
GLUCOSE: 106 mg/dL — AB (ref 65–99)
POTASSIUM: 4.1 mmol/L (ref 3.5–5.2)
Sodium: 142 mmol/L (ref 134–144)

## 2018-09-02 ENCOUNTER — Ambulatory Visit: Payer: Self-pay | Admitting: Cardiology

## 2018-09-05 ENCOUNTER — Encounter (HOSPITAL_COMMUNITY): Payer: Self-pay | Admitting: Cardiology

## 2018-09-05 ENCOUNTER — Other Ambulatory Visit (HOSPITAL_COMMUNITY): Payer: Self-pay | Admitting: Cardiology

## 2018-09-05 DIAGNOSIS — I739 Peripheral vascular disease, unspecified: Secondary | ICD-10-CM | POA: Diagnosis present

## 2018-09-05 HISTORY — DX: Peripheral vascular disease, unspecified: I73.9

## 2018-09-05 NOTE — H&P (Signed)
le   History of Present Illness Hector Maine FNP-C; 07/21/2018 8:40 AM) Patient words: Follow up nuc, echo, le dup; Last office visit 06/17/18.  The patient is a 63 year old male who presents for a Follow-up for Fatigue.  Additional reasons for visit:  Follow-up for Hypertension is described as the following: Patient with hypertension, hyperlipidemia, arthritis, ongoing tobacco use, referred to [place] for evaluation of fatigue and abnormal EKG.  Patient's main complaint was after getting off work he noticed that he did not have the energy to do things around his house like he used to a few years ago and felt fairly fatigued. He underwent exercise nuclear stress test, echocardiogram, and also lower extremity arterial duplex for symptoms of claudication and now presents to discuss results.  Has not had any changes in symptoms since last seen by me. No new complaints. Unfortunately, he does continue to smoke   Reports found to have hypertension one year ago and has been well-controlled. He is on Crestor for management of hyperlipidemia that is stable. Denies any family history of heart disease. 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   Problem List/Past Medical (April Harrington; 07-23-18 1:22 PM) HTN (hypertension) (I10)  Cardiomegaly (I51.7)  Echocardiogram 07/08/2018: Left ventricle cavity is normal in size. Moderate concentric hypertrophy of the left ventricle. Normal global wall motion. Normal diastolic filling pattern. Calculated EF 61%. Left atrial cavity is mildly dilated at 4.1 cm. Arthritis (M19.90)  Tobacco use (Z72.0)  1.5 PPD for 45 years Malaise and fatigue (R53.81, R53.83)  Laboratory examination (Z01.89)  05/20/2018: Cholesterol 96, triglycerides 59, HDL 40, LDL 42. Creatinine 1.03, EGFR 79/91, potassium 3.9, CMP normal. CBC normal. Peripheral arterial disease (I73.9)  Lower extremity arterial duplex  07/08/2018: No hemodynamically significant stenoses are identified in the right lower extremity arterial system. Mild diffuse plaque noted. Left SFA shows monophasic wave form and high resistant flow in the proximal SFA. Sugests hemodynamically significant stenosis of >50%. This exam reveals moderately decreased perfusion of the bilateral lower extremity, noted at the anterior tibial artery level (ABI 0.79). Decreased exercise tolerance (R68.89)  Exercise sestamibi stress test 07/11/2018: 1. The resting electrocardiogram demonstrated normal sinus rhythm, normal resting conduction and no resting arrhythmias. LVH. The stress electrocardiogram was normal. Patient exercised on Bruce protocol for 7:15 minutes and achieved 8.24 METS. Stress test terminated due to fatigue and 88% MPHR achieved (Target HR >85%). Normal BP response. 2. Myocardial perfusion imaging is normal. Overall left ventricular systolic function was normal without regional wall motion abnormalities. The left ventricular ejection fraction was 62%. This is a low risk study.  Allergies (April Harrington; 07-23-2018 1:22 PM) No Known Drug Allergies [06/17/2018]:  Family History (April Harrington; 07/23/18 1:22 PM) Father  Deceased. unknown cause of death, no contact Mother  Deceased. died at 30 cancer, no heart attacks or strokes, no known cardiovascular conditions Brother 2  1 older 1 younger- no heart attacks or strokes, no known cardiovascular conditions Sister 1  younger- no heart attacks or strokes, no known cardiovascular conditions  Social History (April Harrington; 2018/07/23 1:22 PM) Current tobacco use  Current every day smoker. 1.5 ppd since teenager Marital status  Single. Alcohol Use  Occasional alcohol use. Number of Children  3. Living Situation  Lives with roommates. brother in law  Past Surgical History (April Harrington; 2018-07-23 1:22 PM) knee surgery [2016]: Left. hand surgery [2017]: Right.  broken  Medication History (April  Harrington; 07/19/2018 1:32 PM) Rosuvastatin Calcium (10MG Tablet, 1 tablet Oral daily) Active. Lisinopril-hydroCHLOROthiazide (20-12.5MG Tablet, 1 tablet Oral daily) Active. Multiple Vitamin (1 tablet Oral daily) Active. amLODIPine Besylate (10MG Tablet, 1 tablet Oral daily) Active. Tamsulosin HCl (0.4MG Capsule, 0.4 mg Oral daily) Active. Fexofenadine HCl (180MG Tablet, 1 Oral daily) Active. Medications Reconciled (verbally with pt; no list or medication present)  Diagnostic Studies History (April Harrington; 07/19/2018 1:29 PM) Echocardiogram  Echocardiogram 07/08/2018: Left ventricle cavity is normal in size. Moderate concentric hypertrophy of the left ventricle. Normal global wall motion. Normal diastolic filling pattern. Calculated EF 61%. Left atrial cavity is mildly dilated at 4.1 cm. Nuclear stress test  Exercise sestamibi stress test 07/11/2018: 1. The resting electrocardiogram demonstrated normal sinus rhythm, normal resting conduction and no resting arrhythmias. LVH. The stress electrocardiogram was normal. Patient exercised on Bruce protocol for 7:15 minutes and achieved 8.24 METS. Stress test terminated due to fatigue and 88% MPHR achieved (Target HR >85%). Normal BP response. 2. Myocardial perfusion imaging is normal. Overall left ventricular systolic function was normal without regional wall motion abnormalities. The left ventricular ejection fraction was 62%. This is a low risk study. Lower extremity arterial duplex  Lower extremity arterial duplex 07/08/2018: No hemodynamically significant stenoses are identified in the right lower extremity arterial system. Mild diffuse plaque noted. Left SFA shows monophasic wave form and high resistant flow in the proximal SFA. Sugests hemodynamically significant stenosis of >50%.  This exam reveals moderately decreased perfusion of the bilateral lower extremity, noted at the anterior tibial artery  level (ABI 0.79).    Review of Systems Hector Maine, FNP-C; 07/21/2018 8:40 AM) General Present- Fatigue. Not Present- Appetite Loss and Weight Gain. Respiratory Present- Decreased Exercise Tolerance. Not Present- Chronic Cough and Wakes up from Sleep Wheezing or Short of Breath. Cardiovascular Present- Claudications (left leg with walking). Not Present- Chest Pain, Difficulty Breathing Lying Down, Difficulty Breathing On Exertion and Edema. Gastrointestinal Not Present- Black, Tarry Stool and Difficulty Swallowing. Musculoskeletal Present- Joint Pain (left knee and hip pain). Not Present- Decreased Range of Motion and Muscle Atrophy. Neurological Not Present- Attention Deficit. Psychiatric Not Present- Personality Changes and Suicidal Ideation. Endocrine Not Present- Cold Intolerance and Heat Intolerance. Hematology Not Present- Abnormal Bleeding. All other systems negative  Vitals (April Harrington; 07/19/2018 1:33 PM) 07/19/2018 1:29 PM Weight: 181.06 lb Height: 69in Body Surface Area: 1.98 m Body Mass Index: 26.74 kg/m  Pulse: 92 (Regular)  P.OX: 93% (Room air) BP: 143/80 (Sitting, Left Arm, Large)       Physical Exam Hector Maine, FNP-C; 07/21/2018 8:40 AM) General Mental Status-Alert. General Appearance-Cooperative and Appears stated age. Build & Nutrition-Moderately built.  Head and Neck Thyroid Gland Characteristics - normal size and consistency and no palpable nodules.  Chest and Lung Exam Chest and lung exam reveals -quiet, even and easy respiratory effort with no use of accessory muscles, non-tender and on auscultation, normal breath sounds, no adventitious sounds.  Cardiovascular Cardiovascular examination reveals -carotid auscultation reveals no bruits and abdominal aorta auscultation reveals no bruits and no prominent pulsation. Auscultation Heart Sounds - S4.  Abdomen Palpation/Percussion Normal exam - Non Tender and No  hepatosplenomegaly.  Peripheral Vascular Lower Extremity Palpation - Femoral pulse - Bilateral - Normal. Popliteal pulse - Left - Absent. Dorsalis pedis pulse - Left - Absent. Right - Normal. Posterior tibial pulse - Left - Absent. Right - 2+.  Neurologic Neurologic evaluation reveals -alert and oriented x 3 with no impairment of recent or remote memory.  Motor-Grossly intact without any focal deficits.  Musculoskeletal Global Assessment Left Lower Extremity - no deformities, masses or tenderness, no known fractures. Right Lower Extremity - no deformities, masses or tenderness, no known fractures.   Results Hector Maine FNP-C; 07/21/2018 8:37 AM) Procedures  Name Value Date Echocardiography, transthoracic, real-time with image documentation (2D), includes M-mode recording, when performed, complete, with spectral Doppler echocardiography, and with color flow Doppler echocardiography ([id number]) Comments: Echocardiogram 07/08/2018: Left ventricle cavity is normal in size. Moderate concentric hypertrophy of the left ventricle. Normal global wall motion. Normal diastolic filling pattern. Calculated EF 61%. Left atrial cavity is mildly dilated at 4.1 cm.  Performed: 07/08/2018 3:07 PM Myocardial perfusion imaging, tomographic (SPECT) (including attenuation correction, qualitative or quantitative wall motion, ejection fraction by first pass or gated technique, additional quantification, when performed); multiple studies, Comments: Exercise sestamibi stress test 07/11/2018: 1. The resting electrocardiogram demonstrated normal sinus rhythm, normal resting conduction and no resting arrhythmias. LVH. The stress electrocardiogram was normal. Patient exercised on Bruce protocol for 7:15 minutes and achieved 8.24 METS. Stress test terminated due to fatigue and 88% MPHR achieved (Target HR >85%). Normal BP response. 2. Myocardial perfusion imaging is normal. Overall left ventricular systolic  function was normal without regional wall motion abnormalities. The left ventricular ejection fraction was 62%. This is a low risk study.  Performed: 07/11/2018 3:07 PM    Assessment & Plan Hector Maine FNP-C; 07/21/2018 8:37 AM) Lucky Rathke and fatigue (R53.81) Decreased exercise tolerance (R68.89) Story: Exercise sestamibi stress test 07/11/2018: 1. The resting electrocardiogram demonstrated normal sinus rhythm, normal resting conduction and no resting arrhythmias. LVH. The stress electrocardiogram was normal. Patient exercised on Bruce protocol for 7:15 minutes and achieved 8.24 METS. Stress test terminated due to fatigue and 88% MPHR achieved (Target HR >85%). Normal BP response. 2. Myocardial perfusion imaging is normal. Overall left ventricular systolic function was normal without regional wall motion abnormalities. The left ventricular ejection fraction was 62%. This is a low risk study. Peripheral arterial disease (I73.9) Story: Lower extremity arterial duplex 07/08/2018: No hemodynamically significant stenoses are identified in the right lower extremity arterial system. Mild diffuse plaque noted. Left SFA shows monophasic wave form and high resistant flow in the proximal SFA. Sugests hemodynamically significant stenosis of >50%.  This exam reveals moderately decreased perfusion of the bilateral lower extremity, noted at the anterior tibial artery level (ABI 0.79). Current Plans Started Cilostazol 50MG, 1 (one) Tablet two times a day, #30, 30 days starting 07/19/2018, Ref. x1. Benign essential hypertension (I10) Story: EKG 06/17/2018: Normal sinus rhythm at rate of 81 bpm, left atrial enlargement, no evidence of ischemia. Borderline criteria for LVH. Otherwise normal EKG. Tobacco use (Z72.0) Story: 1.5 PPD for 45 years Laboratory examination (Z01.89) Story: 05/20/2018: Cholesterol 96, triglycerides 59, HDL 40, LDL 42. Creatinine 1.03, EGFR 79/91, potassium 3.9, CMP normal. CBC  normal.  Note:. Recommendation:  Patient with hypertension, hyperlipidemia, arthritis, ongoing tobacco use, referred to [place] for evaluation of fatigue and abnormal EKG.  Has recently obtained test results with the patient, no evidence of ischemia by nuclear stress test. Had good exercise capacity. Has essentially normal echocardiogram except for moderate LVH likely related to hypertension. No symptoms of sleep apnea. Would consider pulmonary evaluation for his fatigue.  He does have moderately decreased ABI bilaterally and also monophasic waveform at SFA suggestive of greater than 50% stenosis. He also has symptoms of claudication and abnormal vascular exam. I discussed options of medical management versus PVD angiogram. Patient does not wish  to pursue PV angiogram at this point as he is concerned about risk of the procedure. Although I have advised him low risk of procedure, he wishes to try medications first. He will continue with aspirin and statin therapy. I will start him on cilostazol 50 mg twice a day to see if he has improvement in claudication symptoms.   Addendum: Patient's symptoms not changed and wants to schedule angiogram: Risks benefits discussed.   Extensive discussion with the patient regarding the importance of smoking cessation as I felt this has contributed to his PAD. Options for quitting including patches, Nicorette gum, medications, etc. were discussed. Patient is not interested in medications, we'll consider working to quit on his own. I will continue to evaluate his willingness and progress towards this. I'll see him back in 6 weeks for follow-up.  CC: Lucianne Lei, MD (PCP)    Signed by Hector Maine, FNP-C (07/21/2018 8:41 AM)

## 2018-09-06 ENCOUNTER — Encounter (HOSPITAL_COMMUNITY)
Admission: RE | Disposition: A | Discharge status: Home / Self Care | Payer: Self-pay | Source: Home / Self Care | Attending: Cardiology

## 2018-09-06 ENCOUNTER — Ambulatory Visit (HOSPITAL_COMMUNITY)
Admission: RE | Admit: 2018-09-06 | Discharge: 2018-09-06 | Disposition: A | Discharge status: Home / Self Care | Payer: BLUE CROSS/BLUE SHIELD | Attending: Cardiology | Admitting: Cardiology

## 2018-09-06 ENCOUNTER — Encounter (HOSPITAL_COMMUNITY): Payer: Self-pay | Admitting: Cardiology

## 2018-09-06 DIAGNOSIS — E785 Hyperlipidemia, unspecified: Secondary | ICD-10-CM | POA: Insufficient documentation

## 2018-09-06 DIAGNOSIS — I70213 Atherosclerosis of native arteries of extremities with intermittent claudication, bilateral legs: Secondary | ICD-10-CM | POA: Insufficient documentation

## 2018-09-06 DIAGNOSIS — F17210 Nicotine dependence, cigarettes, uncomplicated: Secondary | ICD-10-CM | POA: Diagnosis not present

## 2018-09-06 DIAGNOSIS — R9431 Abnormal electrocardiogram [ECG] [EKG]: Secondary | ICD-10-CM | POA: Diagnosis not present

## 2018-09-06 DIAGNOSIS — I10 Essential (primary) hypertension: Secondary | ICD-10-CM | POA: Diagnosis not present

## 2018-09-06 DIAGNOSIS — I70212 Atherosclerosis of native arteries of extremities with intermittent claudication, left leg: Secondary | ICD-10-CM | POA: Diagnosis not present

## 2018-09-06 DIAGNOSIS — M1990 Unspecified osteoarthritis, unspecified site: Secondary | ICD-10-CM | POA: Insufficient documentation

## 2018-09-06 DIAGNOSIS — Z79899 Other long term (current) drug therapy: Secondary | ICD-10-CM | POA: Insufficient documentation

## 2018-09-06 DIAGNOSIS — I739 Peripheral vascular disease, unspecified: Secondary | ICD-10-CM | POA: Diagnosis present

## 2018-09-06 HISTORY — PX: PERIPHERAL VASCULAR BALLOON ANGIOPLASTY: CATH118281

## 2018-09-06 HISTORY — PX: ABDOMINAL AORTOGRAM W/LOWER EXTREMITY: CATH118223

## 2018-09-06 HISTORY — DX: Peripheral vascular disease, unspecified: I73.9

## 2018-09-06 LAB — POCT ACTIVATED CLOTTING TIME: Activated Clotting Time: 246 seconds

## 2018-09-06 SURGERY — ABDOMINAL AORTOGRAM W/LOWER EXTREMITY
Anesthesia: LOCAL

## 2018-09-06 MED ORDER — LIDOCAINE HCL (PF) 1 % IJ SOLN
INTRAMUSCULAR | Status: AC
  Filled 2018-09-06: qty 30

## 2018-09-06 MED ORDER — FENTANYL CITRATE (PF) 100 MCG/2ML IJ SOLN
INTRAMUSCULAR | Status: AC
  Filled 2018-09-06: qty 2

## 2018-09-06 MED ORDER — HEPARIN (PORCINE) IN NACL 1000-0.9 UT/500ML-% IV SOLN
INTRAVENOUS | Status: DC | PRN
  Administered 2018-09-06: 500 mL

## 2018-09-06 MED ORDER — CLOPIDOGREL BISULFATE 75 MG PO TABS: 75.0000 mg | ORAL_TABLET | Freq: Every day | ORAL | 3 refills | Status: DC

## 2018-09-06 MED ORDER — FENTANYL CITRATE (PF) 100 MCG/2ML IJ SOLN
INTRAMUSCULAR | Status: DC | PRN
  Administered 2018-09-06 (×2): 25 ug via INTRAVENOUS

## 2018-09-06 MED ORDER — HEPARIN SODIUM (PORCINE) 1000 UNIT/ML IJ SOLN
INTRAMUSCULAR | Status: AC
  Filled 2018-09-06: qty 1

## 2018-09-06 MED ORDER — SODIUM CHLORIDE 0.9 % IV SOLN
INTRAVENOUS | Status: DC
  Administered 2018-09-06: 06:00:00 via INTRAVENOUS

## 2018-09-06 MED ORDER — CLOPIDOGREL BISULFATE 300 MG PO TABS
ORAL_TABLET | ORAL | Status: AC
  Filled 2018-09-06: qty 1

## 2018-09-06 MED ORDER — CLOPIDOGREL BISULFATE 75 MG PO TABS: 75.0000 mg | ORAL_TABLET | Freq: Every day | ORAL | Status: DC

## 2018-09-06 MED ORDER — ACETAMINOPHEN 325 MG PO TABS: 650.0000 mg | ORAL_TABLET | ORAL | Status: DC | PRN

## 2018-09-06 MED ORDER — LABETALOL HCL 5 MG/ML IV SOLN: 10.0000 mg | INTRAVENOUS | Status: DC | PRN

## 2018-09-06 MED ORDER — SODIUM CHLORIDE 0.9 % IV SOLN: INTRAVENOUS | Status: DC

## 2018-09-06 MED ORDER — ASPIRIN EC 81 MG PO TBEC: 81.0000 mg | DELAYED_RELEASE_TABLET | Freq: Every day | ORAL | 3 refills | Status: DC

## 2018-09-06 MED ORDER — MIDAZOLAM HCL 2 MG/2ML IJ SOLN
INTRAMUSCULAR | Status: DC | PRN
  Administered 2018-09-06 (×2): 1 mg via INTRAVENOUS

## 2018-09-06 MED ORDER — LIDOCAINE HCL (PF) 1 % IJ SOLN
INTRAMUSCULAR | Status: DC | PRN
  Administered 2018-09-06: 15 mL

## 2018-09-06 MED ORDER — SODIUM CHLORIDE 0.9% FLUSH: 3.0000 mL | INTRAVENOUS | Status: DC | PRN

## 2018-09-06 MED ORDER — SODIUM CHLORIDE 0.9 % IV SOLN: 250.0000 mL | INTRAVENOUS | Status: DC | PRN

## 2018-09-06 MED ORDER — SODIUM CHLORIDE 0.9% FLUSH: 3.0000 mL | Freq: Two times a day (BID) | INTRAVENOUS | Status: DC

## 2018-09-06 MED ORDER — ASPIRIN EC 81 MG PO TBEC: 81.0000 mg | DELAYED_RELEASE_TABLET | Freq: Every day | ORAL | Status: DC

## 2018-09-06 MED ORDER — HYDRALAZINE HCL 20 MG/ML IJ SOLN: 5.0000 mg | INTRAMUSCULAR | Status: DC | PRN

## 2018-09-06 MED ORDER — ONDANSETRON HCL 4 MG/2ML IJ SOLN: 4.0000 mg | Freq: Four times a day (QID) | INTRAMUSCULAR | Status: DC | PRN

## 2018-09-06 MED ORDER — CLOPIDOGREL BISULFATE 300 MG PO TABS
ORAL_TABLET | ORAL | Status: DC | PRN
  Administered 2018-09-06: 300 mg via ORAL

## 2018-09-06 MED ORDER — MIDAZOLAM HCL 2 MG/2ML IJ SOLN
INTRAMUSCULAR | Status: AC
  Filled 2018-09-06: qty 2

## 2018-09-06 MED ORDER — IODIXANOL 320 MG/ML IV SOLN
INTRAVENOUS | Status: DC | PRN
  Administered 2018-09-06: 155 mL via INTRA_ARTERIAL

## 2018-09-06 MED ORDER — HEPARIN (PORCINE) IN NACL 1000-0.9 UT/500ML-% IV SOLN
INTRAVENOUS | Status: AC
  Filled 2018-09-06: qty 1000

## 2018-09-06 MED ORDER — HEPARIN SODIUM (PORCINE) 1000 UNIT/ML IJ SOLN
INTRAMUSCULAR | Status: DC | PRN
  Administered 2018-09-06: 6000 [IU] via INTRAVENOUS
  Administered 2018-09-06: 2000 [IU] via INTRAVENOUS

## 2018-09-06 MED FILL — ASPIRIN LOW DOSE 81 MG TBEC: 81 | 90 days supply | Qty: 90 | Fill #0 | Status: TO

## 2018-09-06 MED FILL — CLOPIDOGREL 75 MG TABLET: 75 | 90 days supply | Qty: 90 | Fill #0 | Status: TO

## 2018-09-06 SURGICAL SUPPLY — 28 items
BALLN IN.PACT DCB 6X80 (BALLOONS) ×4
BALLN MUSTANG 5.0X40 135 (BALLOONS) ×4
BALLOON MUSTANG 5.0X40 135 (BALLOONS) IMPLANT
CATH OMNI FLUSH 5F 65CM (CATHETERS) ×1 IMPLANT
CATH STRAIGHT 5FR 65CM (CATHETERS) ×1 IMPLANT
CATH TEMPO AQUA 5F 100CM (CATHETERS) ×1 IMPLANT
CLOSURE MYNX CONTROL 6F/7F (Vascular Products) ×1 IMPLANT
DCB IN.PACT 6X80 (BALLOONS) IMPLANT
DEVICE TORQUE .025-.038 (MISCELLANEOUS) ×1 IMPLANT
GUIDEWIRE ANGLED .035X150CM (WIRE) ×1 IMPLANT
GUIDEWIRE ANGLED .035X260CM (WIRE) ×1 IMPLANT
KIT ENCORE 26 ADVANTAGE (KITS) ×1 IMPLANT
KIT MICROPUNCTURE NIT STIFF (SHEATH) ×1 IMPLANT
KIT PV (KITS) ×4 IMPLANT
SHEATH FLEXOR ANSEL 1 7F 45CM (SHEATH) ×1 IMPLANT
SHEATH PINNACLE 5F 10CM (SHEATH) ×1 IMPLANT
SHEATH PINNACLE 7F 10CM (SHEATH) ×1 IMPLANT
SHEATH PROBE COVER 6X72 (BAG) ×1 IMPLANT
STOPCOCK MORSE 400PSI 3WAY (MISCELLANEOUS) ×1 IMPLANT
SYR MEDRAD MARK 7 150ML (SYRINGE) ×4 IMPLANT
TAPE VIPERTRACK RADIOPAQ (MISCELLANEOUS) IMPLANT
TAPE VIPERTRACK RADIOPAQUE (MISCELLANEOUS) ×4
TRANSDUCER W/STOPCOCK (MISCELLANEOUS) ×4 IMPLANT
TRAY PV CATH (CUSTOM PROCEDURE TRAY) ×4 IMPLANT
TUBING CIL FLEX 10 FLL-RA (TUBING) ×1 IMPLANT
WIRE BENTSON .035X145CM (WIRE) ×1 IMPLANT
WIRE ROSEN-J .035X180CM (WIRE) ×1 IMPLANT
WIRE ROSEN-J .035X260CM (WIRE) ×1 IMPLANT

## 2018-09-06 NOTE — Discharge Instructions (Signed)
Femoral Site Care °This sheet gives you information about how to care for yourself after your procedure. Your health care provider may also give you more specific instructions. If you have problems or questions, contact your health care provider. °What can I expect after the procedure? °After the procedure, it is common to have: °· Bruising that usually fades within 1-2 weeks. °· Tenderness at the site. °Follow these instructions at home: °Wound care °· Follow instructions from your health care provider about how to take care of your insertion site. Make sure you: °? Wash your hands with soap and water before you change your bandage (dressing). If soap and water are not available, use hand sanitizer. °? Remove your dressing as told by your health care provider. In 24-48 hours °· Do not take baths, swim, or use a hot tub until your health care provider approves. °· You may shower 24-48 hours after the procedure or as told by your health care provider. °? Gently wash the site with plain soap and water. °? Pat the area dry with a clean towel. °? Do not rub the site. This may cause bleeding. °· Do not apply powder or lotion to the site. Keep the site clean and dry. °· Check your femoral site every day for signs of infection. Check for: °? Redness, swelling, or pain. °? Fluid or blood. °? Warmth. °? Pus or a bad smell. °Activity °· For the first 2-3 days after your procedure, or as long as directed: °? Avoid climbing stairs as much as possible. °? Do not squat. °· Do not lift anything that is heavier than 10 lb (4.5 kg), or the limit that you are told, until your health care provider says that it is safe. For 5 days °· Rest as directed. °? Avoid sitting for a long time without moving. Get up to take short walks every 1-2 hours. °· Do not drive for 24 hours if you were given a medicine to help you relax (sedative). °General instructions °· Take over-the-counter and prescription medicines only as told by your health care  provider. °· Keep all follow-up visits as told by your health care provider. This is important. °Contact a health care provider if you have: °· A fever or chills. °· You have redness, swelling, or pain around your insertion site. °Get help right away if: °· The catheter insertion area swells very fast. °· You pass out. °· You suddenly start to sweat or your skin gets clammy. °· The catheter insertion area is bleeding, and the bleeding does not stop when you hold steady pressure on the area. °· The area near or just beyond the catheter insertion site becomes pale, cool, tingly, or numb. °These symptoms may represent a serious problem that is an emergency. Do not wait to see if the symptoms will go away. Get medical help right away. Call your local emergency services (911 in the [place] Do not drive yourself to the hospital. °Summary °· After the procedure, it is common to have bruising that usually fades within 1-2 weeks. °· Check your femoral site every day for signs of infection. °· Do not lift anything that is heavier than 10 lb (4.5 kg), or the limit that you are told, until your health care provider says that it is safe. °This information is not intended to replace advice given to you by your health care provider. Make sure you discuss any questions you have with your health care provider. °Document Released: 02/23/2014 Document Revised: 07/05/2017 Document   Reviewed: 07/05/2017 °Elsevier Interactive Patient Education © 2019 [employer] ° °

## 2018-09-06 NOTE — Interval H&P Note (Signed)
History and Physical Interval Note:  09/06/2018 7:34 AM  Hector [name]  has presented today for surgery, with the diagnosis of claudication  The various methods of treatment have been discussed with the patient and family. After consideration of risks, benefits and other options for treatment, the patient has consented to  Procedure(s): LOWER EXTREMITY ANGIOGRAPHY (Bilateral) as a surgical intervention .  The patient's history has been reviewed, patient examined, no change in status, stable for surgery.  I have reviewed the patient's chart and labs.  Questions were answered to the patient's satisfaction.     [name]

## 2018-09-06 NOTE — Progress Notes (Signed)
Pt states he took a full strength aspirin at home at 0200 this morning.

## 2018-09-14 NOTE — TOC Transition Note (Signed)
Transitions of Care Follow Up Call Note  Hector [name] is an 63 y.o. male who presented to [facility] on 09/06/2018.  The patient had the following prescriptions filled at [facility] Transitions [facility] Care [facility]: Aspirin 81mg , clopidogrel 75mg   Pharmacist comments: [facility] attempted to call patient 3 times. No answer each time. Medications filled at [facility] pharmacy were transferred to patient's pharmacy found on file.    Varney Baas Drelyn Pistilli 09/14/2018, 5:34 PM Transitions of Care Pharmacy Hours: Monday - Friday 8:30am to 5:00 PM  Phone - [phone]

## 2018-09-15 NOTE — Progress Notes (Signed)
Subjective:   Hector [name], male    DOB: 12/08/1955, 63 y.o.   MRN: [id number]  Lucianne Lei, MD:  Chief Complaint  Patient presents with  . PV Angiogram  . Follow-up    7-10 day    HPI: Hector [name]  is a 63 y.o. male  with with hypertension, hyperlipidemia, arthritis, ongoing tobacco use, referred to [place] for evaluation of fatigue and abnormal EKG.  Patient's main complaint was after getting off work he noticed that he did not have the energy to do things around his house like he used to a few years ago and felt fairly fatigued. Patient underwent exercise nuclear stress test on 07/11/2018 that was considered limited study.  Echocardiogram also performed at that time showed moderate LVH, normal LVEF.  Lower extremity arterial duplex performed at that time revealed hemodynamically significant stenosis greater than 50% of left SFA.  Right lower extremity did not reveal any significant stenosis.    I had recommended PV angiogram; however, patient was reluctant to the procedure in view of his risk.  He later called our office requesting to have procedure which was performed on 09/06/2018 and underwent successful PTA left SFA.  He is now on plavix and aspirin.  He now presents for post procedure follow-up.  No complications postprocedure. He has returned to work. Notices slight improvement in leg fatigue; however, continues to complain of left knee pain and occasional pain in his left hip. He states he needs someone to fix his knee.  Unfortunately, he does continue to smoke 1 pack per day  Reports found to have hypertension one year ago and has been well-controlled. He is on Crestor for management of hyperlipidemia that is stable. Denies any family history of heart disease. 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    Past Medical History:  Diagnosis Date  . Arthritis   . Claudication in peripheral vascular disease ([place])  09/05/2018  . GERD (gastroesophageal reflux disease)   . Gout    L knee  . Hypertension     Past Surgical History:  Procedure Laterality Date  . ABDOMINAL AORTOGRAM W/LOWER EXTREMITY N/A 09/06/2018   Procedure: ABDOMINAL AORTOGRAM W/LOWER EXTREMITY;  Surgeon: Nigel Mormon, MD;  Location: [facility];  Service: Cardiovascular;  Laterality: N/A;  . FACIAL COSMETIC SURGERY     due ot shattered bones   . PERIPHERAL VASCULAR BALLOON ANGIOPLASTY  09/06/2018   Procedure: PERIPHERAL VASCULAR BALLOON ANGIOPLASTY;  Surgeon: Nigel Mormon, MD;  Location: [facility];  Service: Cardiovascular;;  LT SFA  . right hand fracture    . TOTAL KNEE ARTHROPLASTY Left 04/04/2013   Procedure: LEFT TOTAL KNEE ARTHROPLASTY;  Surgeon: Tobi Bastos, MD;  Location: [facility];  Service: Orthopedics;  Laterality: Left;    Family History  Problem Relation Age of Onset  . Cancer Mother     Social History   Socioeconomic History  . Marital status: Single    Spouse name: Not on file  . Number of children: 3  . Years of education: Not on file  . Highest education level: Not on file  Occupational History  . Occupation: roofer  Social Needs  . Financial resource strain: Not on file  . Food insecurity:    Worry: Not on file    Inability: Not on file  . Transportation needs:    Medical: Not on  file    Non-medical: Not on file  Tobacco Use  . Smoking status: Current Every Day Smoker    Packs/day: 1.00    Years: 39.00    Pack years: 39.00    Types: Cigarettes  . Smokeless tobacco: Never Used  Substance and Sexual Activity  . Alcohol use: Yes    Alcohol/week: 12.0 standard drinks    Types: 12 Cans of beer per week  . Drug use: No  . Sexual activity: Never    Birth control/protection: Abstinence  Lifestyle  . Physical activity:    Days per week: Not on file    Minutes per session: Not on file  . Stress: Not on file  Relationships  . Social connections:    Talks on phone:  Not on file    Gets together: Not on file    Attends religious service: Not on file    Active member of club or organization: Not on file    Attends meetings of clubs or organizations: Not on file    Relationship status: Not on file  . Intimate partner violence:    Fear of current or ex partner: Not on file    Emotionally abused: Not on file    Physically abused: Not on file    Forced sexual activity: Not on file  Other Topics Concern  . 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 amLODipine (NORVASC) 10 MG tablet Take 10 mg by mouth daily.  Marland Kitchen aspirin EC 81 MG tablet Take 1 tablet (81 mg total) by mouth daily.  . cilostazol (PLETAL) 50 MG tablet Take 50 mg by mouth 2 (two) times daily.  . clopidogrel (PLAVIX) 75 MG tablet Take 1 tablet (75 mg total) by mouth daily.  . fexofenadine (ALLEGRA) 180 MG tablet Take 180 mg by mouth daily.  Marland Kitchen lisinopril-hydrochlorothiazide (PRINZIDE,ZESTORETIC) 20-12.5 MG tablet Take 1 tablet by mouth daily.  Marland Kitchen omeprazole (PRILOSEC OTC) 20 MG tablet Take 20 mg by mouth daily.  . rosuvastatin (CRESTOR) 10 MG tablet Take 10 mg by mouth daily.  . tamsulosin (FLOMAX) 0.4 MG CAPS capsule Take 0.4 mg by mouth daily after supper.     Review of Systems  Constitution: Positive for malaise/fatigue. Negative for decreased appetite, weight gain and weight loss.  Eyes: Negative for visual disturbance.  Cardiovascular: Positive for claudication (left leg with walking) and dyspnea on exertion (Decreased Exercise Tolerance). Negative for chest pain, leg swelling, orthopnea, palpitations and syncope.  Respiratory: Negative for hemoptysis and wheezing.   Endocrine: Negative for cold intolerance and heat intolerance.  Hematologic/Lymphatic: Does not bruise/bleed easily.  Skin: Negative for nail changes.  Musculoskeletal: Positive for joint pain (left knee and hip pain). Negative for muscle weakness and  myalgias.  Gastrointestinal: Negative for abdominal pain, change in bowel habit, nausea and vomiting.  Neurological: Negative for difficulty with concentration, dizziness, focal weakness and headaches.  Psychiatric/Behavioral: Negative for altered mental status and suicidal ideas.  All other systems reviewed and are negative.      Objective:     Blood pressure 116/68, pulse 98, height _0  (1.753 m), weight 177 lb 11.2 oz (80.6 kg), SpO2 91 %.  Cardiac studies:  Exercise sestamibi stress test 07/11/2018: 1. The resting electrocardiogram demonstrated normal sinus rhythm, normal resting conduction and no resting arrhythmias. LVH.  The stress electrocardiogram was normal. Patient exercised on Bruce protocol for 7:15 minutes and achieved 8.24 METS.  Stress test terminated due to fatigue and 88% MPHR achieved (Target HR >85%). Normal BP response. 2. Myocardial perfusion imaging is normal. Overall left ventricular systolic function was normal without regional wall motion abnormalities. The left ventricular ejection fraction was 62%.  This is a low risk study.  Echocardiogram 07/08/2018: Left ventricle cavity is normal in size. Moderate concentric hypertrophy of the left ventricle. Normal global wall motion. Normal diastolic filling pattern. Calculated EF 61%. Left atrial cavity is mildly dilated at 4.1 cm.  Lower extremity arterial duplex 07/08/2018: No hemodynamically significant stenoses are identified in the right lower extremity arterial system. Mild diffuse plaque noted. Left SFA shows monophasic wave form and high resistant flow in the proximal SFA. Sugests hemodynamically significant stenosis of >50%. This exam reveals moderately decreased perfusion of the bilateral lower extremity, noted at the anterior tibial artery level (ABI 0.79).   Recent Labs:   CMP Latest Ref Rng & Units 08/29/2018  Glucose 65 - 99 mg/dL 106(H)  BUN 8 - 27 mg/dL 9  Creatinine 0.76 - 1.27 mg/dL 1.07  Sodium  134 - 144 mmol/L 142  Potassium 3.5 - 5.2 mmol/L 4.1  Chloride 96 - 106 mmol/L 103  CO2 20 - 29 mmol/L 23  Calcium 8.6 - 10.2 mg/dL 9.6  Total Protein 6.0 - 8.3 g/dL -  Total Bilirubin 0.2 - 1.2 mg/dL -  Alkaline Phos 39 - 117 U/L -  AST 0 - 37 U/L -  ALT 0 - 53 U/L -  eGFR Af Amer >60 ml/min 87   CBC Latest Ref Rng & Units 08/29/2018  WBC 3.4 - 10.8 x10E3/uL 8.9  Hemoglobin 13.0 - 17.7 g/dL 16.1  Hematocrit 37.5 - 51.0 % 48.7  Platelets 150 - 450 x10E3/uL 303   05/20/2018: Cholesterol 96, triglycerides 59, HDL 40, LDL 42. Creatinine 1.03, EGFR 79/91  Physical Exam  Constitutional: He is oriented to person, place, and time. He appears well-developed and well-nourished. No distress.  HENT:  Head: Atraumatic.  Eyes: Conjunctivae are normal.  Neck: Neck supple. No JVD present. No thyromegaly present.  Cardiovascular: Normal rate, regular rhythm and intact distal pulses. Exam reveals gallop and S4.  No murmur heard. Pulses:      Femoral pulses are 2+ on the right side and 2+ on the left side.      Popliteal pulses are 2+ on the right side and 2+ on the left side.       Dorsalis pedis pulses are 2+ on the right side and 1+ on the left side.       Posterior tibial pulses are 1+ on the right side and 2+ on the left side.  Small quarter sized hematoma to right groin. Normal pulse. No evidence of infection.   Pulmonary/Chest: Effort normal and breath sounds normal.  Abdominal: Soft. Bowel sounds are normal.  Musculoskeletal: Normal range of motion.        General: No edema.  Neurological: He is alert and oriented to person, place, and time.  Skin: Skin is warm and dry.  Psychiatric: He has a normal mood and affect.           Assessment & Recommendations:  1. PAD (peripheral artery disease) (HCC) Tolerated procedure well and vascular exam has improved.  He continues to have pain in his left knee and also in his left hip with walking but does feel that some of his tiredness in  his legs has improved.  Found to be on Pletal and Plavix, will discontinue Pletal.  He understands he will need dual antiplatelet therapy for at least 6 months.  A small right hematoma with normal pulse.  Advised him to continue to monitor and to notify me if no improvement or if area enlarges.  Will schedule for ABI for reevaluation after his procedure.  2. Fatigue, unspecified type Unchanged from before.  3. Tobacco use disorder Unfortunately he continues to smoke 1 pack/day, I have stressed the importance of complete smoking cessation to prevent progression of PAD and also other complications such as CAD and CVA.  Patient states that he will work to quit smoking.  He is not interested in medications at this time.  Plan: Patient is doing well post procedure.  I will see him back in 3 months for follow-up.  We will notify him of ABI results.   Jeri Lager, MSN, APRN, FNP-C [facility], [place] Office: [phone] Fax: [phone]

## 2018-09-16 ENCOUNTER — Ambulatory Visit (INDEPENDENT_AMBULATORY_CARE_PROVIDER_SITE_OTHER): Payer: BC Managed Care – PPO | Admitting: Cardiology

## 2018-09-16 ENCOUNTER — Other Ambulatory Visit: Payer: Self-pay

## 2018-09-16 ENCOUNTER — Encounter: Payer: Self-pay | Admitting: Cardiology

## 2018-09-16 VITALS — BP 116/68 | HR 98 | Ht 69.0 in | Wt 177.7 lb

## 2018-09-16 DIAGNOSIS — I739 Peripheral vascular disease, unspecified: Secondary | ICD-10-CM | POA: Diagnosis not present

## 2018-09-16 DIAGNOSIS — F17200 Nicotine dependence, unspecified, uncomplicated: Secondary | ICD-10-CM

## 2018-09-16 DIAGNOSIS — R5383 Other fatigue: Secondary | ICD-10-CM | POA: Diagnosis not present

## 2018-09-16 NOTE — Patient Instructions (Signed)
Stop Pletal  Continue with Plavix and Aspirin AND PLEASE STOP SMOKING

## 2018-09-19 ENCOUNTER — Other Ambulatory Visit: Payer: Self-pay | Admitting: Cardiology

## 2018-09-19 DIAGNOSIS — I739 Peripheral vascular disease, unspecified: Secondary | ICD-10-CM

## 2018-09-30 ENCOUNTER — Other Ambulatory Visit: Payer: BLUE CROSS/BLUE SHIELD

## 2018-10-03 ENCOUNTER — Other Ambulatory Visit: Payer: BLUE CROSS/BLUE SHIELD

## 2018-10-29 ENCOUNTER — Other Ambulatory Visit: Payer: Self-pay | Admitting: Cardiology

## 2018-10-29 DIAGNOSIS — I739 Peripheral vascular disease, unspecified: Secondary | ICD-10-CM

## 2018-11-01 ENCOUNTER — Other Ambulatory Visit: Payer: BLUE CROSS/BLUE SHIELD

## 2018-11-25 DIAGNOSIS — M1000 Idiopathic gout, unspecified site: Secondary | ICD-10-CM | POA: Diagnosis not present

## 2018-11-25 DIAGNOSIS — M10071 Idiopathic gout, right ankle and foot: Secondary | ICD-10-CM | POA: Diagnosis not present

## 2018-11-25 DIAGNOSIS — M130 Polyarthritis, unspecified: Secondary | ICD-10-CM | POA: Diagnosis not present

## 2018-11-25 DIAGNOSIS — R3912 Poor urinary stream: Secondary | ICD-10-CM | POA: Diagnosis not present

## 2018-11-25 DIAGNOSIS — I10 Essential (primary) hypertension: Secondary | ICD-10-CM | POA: Diagnosis not present

## 2018-11-25 DIAGNOSIS — E782 Mixed hyperlipidemia: Secondary | ICD-10-CM | POA: Diagnosis not present

## 2018-11-25 DIAGNOSIS — I739 Peripheral vascular disease, unspecified: Secondary | ICD-10-CM | POA: Diagnosis not present

## 2018-12-07 ENCOUNTER — Other Ambulatory Visit: Payer: BLUE CROSS/BLUE SHIELD

## 2018-12-23 ENCOUNTER — Ambulatory Visit (INDEPENDENT_AMBULATORY_CARE_PROVIDER_SITE_OTHER): Payer: BC Managed Care – PPO

## 2018-12-23 ENCOUNTER — Other Ambulatory Visit: Payer: Self-pay

## 2018-12-23 ENCOUNTER — Ambulatory Visit: Payer: BLUE CROSS/BLUE SHIELD | Admitting: Cardiology

## 2018-12-23 DIAGNOSIS — I739 Peripheral vascular disease, unspecified: Secondary | ICD-10-CM | POA: Diagnosis not present

## 2018-12-28 ENCOUNTER — Telehealth: Payer: Self-pay | Admitting: General Practice

## 2018-12-28 NOTE — Telephone Encounter (Signed)
Called pt to schedule appt for left back pain. Pt has not been seen since 2018 and needs a TOC appt. LVMTCB

## 2018-12-28 NOTE — Progress Notes (Deleted)
Primary Physician:  Renaye RakersBland, Veita, MD   Patient ID: Hector Hector [name] Flammer, male    DOB: 04/28/1956, 63 y.o.   MRN: [id number]  Subjective:    No chief complaint on file.   HPI: Hector Hector [name] Hector Hector [name]  is Hector [name] 63 y.o. male  with hypertension, hyperlipidemia, arthritis, ongoing tobacco use, PAD s/p PV angiogram on 09/06/2018 with PTA to left SFA.   Patient's main complaint was after getting off work he noticed that he did not have the energy to do things around his house like he used to Hector [name] few years ago and felt fairly fatigued. Patient underwent exercise nuclear stress test on 07/11/2018 that was considered limited study.  Echocardiogram also performed at that time showed moderate LVH, normal LVEF.     This Hector [name] 3 month virtual visit to discuss results. Continues to be on DAPT. Notices slight improvement in leg fatigue; however, continues to complain of left knee pain and occasional pain in his left hip. He states he needs someone to fix his knee.  Unfortunately, he does continue to smoke 1 pack per day  Reports found to have hypertension one year ago and has been well-controlled. He is on Crestor for management of hyperlipidemia that is stable. Denies any family history of heart disease. 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   Past Medical History:  Diagnosis Date  . Arthritis   . Claudication in peripheral vascular disease (HCC) 09/05/2018  . GERD (gastroesophageal reflux disease)   . Gout    L knee  . Hypertension     Past Surgical History:  Procedure Laterality Date  . ABDOMINAL AORTOGRAM W/LOWER EXTREMITY N/Hector [name] 09/06/2018   Procedure: ABDOMINAL AORTOGRAM W/LOWER EXTREMITY;  Surgeon: Elder NegusPatwardhan, Manish J, MD;  Location: [facility];  Service: Cardiovascular;  Laterality: N/Hector [name];  . FACIAL COSMETIC SURGERY     due ot shattered bones   . PERIPHERAL VASCULAR BALLOON ANGIOPLASTY  09/06/2018   Procedure: PERIPHERAL VASCULAR BALLOON  ANGIOPLASTY;  Surgeon: Elder NegusPatwardhan, Manish J, MD;  Location: [facility];  Service: Cardiovascular;;  LT SFA  . right hand fracture    . TOTAL KNEE ARTHROPLASTY Left 04/04/2013   Procedure: LEFT TOTAL KNEE ARTHROPLASTY;  Surgeon: Jacki Conesonald Hector [name] Gioffre, MD;  Location: [facility];  Service: Orthopedics;  Laterality: Left;    Social History   Socioeconomic History  . Marital status: Single    Spouse name: Not on file  . Number of children: 3  . Years of education: Not on file  . Highest education level: Not on file  Occupational History  . Occupation: roofer  Social Needs  . Financial resource strain: Not on file  . Food insecurity    Worry: Not on file    Inability: Not on file  . Transportation needs    Medical: Not on file    Non-medical: Not on file  Tobacco Use  . Smoking status: Current Every Day Smoker    Packs/day: 1.00    Years: 39.00    Pack years: 39.00    Types: Cigarettes  . Smokeless tobacco: Never Used  Substance and Sexual Activity  . Alcohol use: Yes    Alcohol/week: 12.0 standard drinks    Types: 12 Cans of beer per week  . Drug use: No  . Sexual activity: Never    Birth control/protection: Abstinence  Lifestyle  . Physical activity    Days per week: Not on  file    Minutes per session: Not on file  . Stress: Not on file  Relationships  . Social Herbalist on phone: Not on file    Gets together: Not on file    Attends religious service: Not on file    Active member of club or organization: Not on file    Attends meetings of clubs or organizations: Not on file    Relationship status: Not on file  . Intimate partner violence    Fear of current or ex partner: Not on file    Emotionally abused: Not on file    Physically abused: Not on file    Forced sexual activity: Not on file  Other Topics Concern  . 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  Negative for decreased appetite, weight gain and weight loss.  Eyes: Negative for visual disturbance.  Cardiovascular: Positive for claudication (left leg with walking) and dyspnea on exertion (Decreased Exercise Tolerance). Negative for chest pain, leg swelling, orthopnea, palpitations and syncope.  Respiratory: Negative for hemoptysis and wheezing.   Endocrine: Negative for cold intolerance and heat intolerance.  Hematologic/Lymphatic: Does not bruise/bleed easily.  Skin: Negative for nail changes.  Musculoskeletal: Positive for joint pain (left knee and hip pain). Negative for muscle weakness and myalgias.  Gastrointestinal: Negative for abdominal pain, change in bowel habit, nausea and vomiting.  Neurological: Negative for difficulty with concentration, dizziness, focal weakness and headaches.  Psychiatric/Behavioral: Negative for altered mental status and suicidal ideas.  All other systems reviewed and are negative.     Objective:  There were no vitals taken for this visit. There is no height or weight on file to calculate BMI.   Physical exam not performed or limited due to virtual visit.  Patient appeared to be in no distress, Neck was supple, respiration was not labored.  Please see exam details from prior visit is as below.    Physical Exam  Constitutional: He is oriented to person, place, and time. He appears well-developed and well-nourished. No distress.  HENT:  Head: Atraumatic.  Eyes: Conjunctivae are normal.  Neck: Neck supple. No JVD present. No thyromegaly present.  Cardiovascular: Normal rate, regular rhythm and intact distal pulses. Exam reveals gallop and S4.  No murmur heard. Pulses:      Femoral pulses are 2+ on the right side and 2+ on the left side.      Popliteal pulses are 2+ on the right side and 2+ on the left side.       Dorsalis pedis pulses are 2+ on the right side and 1+ on the left side.       Posterior tibial pulses are 1+ on the right side and 2+  on the left side.  Small quarter sized hematoma to right groin. Normal pulse. No evidence of infection.   Pulmonary/Chest: Effort normal and breath sounds normal.  Abdominal: Soft. Bowel sounds are normal.  Musculoskeletal: Normal range of motion.        General: No edema.  Neurological: He is alert and oriented to person, place, and time.  Skin: Skin is warm and dry.  Psychiatric: He has Hector [name] normal mood and affect.   Radiology: No results found.  Laboratory examination:   *** CMP Latest Ref Rng & Units 08/29/2018 10/17/2017 03/17/2014  Glucose 65 - 99 mg/dL 106(H) 96 95  BUN 8 - 27 mg/dL  9 13 10   Creatinine 0.76 - 1.27 mg/dL 1.611.07 0.960.95 0.450.83  Sodium 134 - 144 mmol/L 142 138 140  Potassium 3.5 - 5.2 mmol/L 4.1 4.3 4.4  Chloride 96 - 106 mmol/L 103 106 106  CO2 20 - 29 mmol/L 23 22 21   Calcium 8.6 - 10.2 mg/dL 9.6 9.2 9.8  Total Protein 6.0 - 8.3 g/dL - - 7.0  Total Bilirubin 0.2 - 1.2 mg/dL - - 0.8  Alkaline Phos 39 - 117 U/L - - 101  AST 0 - 37 U/L - - 23  ALT 0 - 53 U/L - - 28   CBC Latest Ref Rng & Units 08/29/2018 10/17/2017 03/17/2014  WBC 3.4 - 10.8 x10E3/uL 8.9 9.2 8.5  Hemoglobin 13.0 - 17.7 g/dL 40.916.1 81.115.2 91.415.7  Hematocrit 37.5 - 51.0 % 48.7 45.5 48.4  Platelets 150 - 450 x10E3/uL 303 308 -   Lipid Panel  No results found for: CHOL, TRIG, HDL, CHOLHDL, VLDL, LDLCALC, LDLDIRECT HEMOGLOBIN A1C No results found for: HGBA1C, MPG TSH No results for input(s): TSH in the last 8760 hours.  PRN Meds:. There are no discontinued medications. No outpatient medications have been marked as taking for the 12/29/18 encounter (Appointment) with Toniann FailKelley, Arla Boutwell Haynes, NP.    Cardiac Studies:   PV/ Abdominal aortogram 09/06/18: Patent renal arteries Normal distal abdominal aorta & bilateral EIA and Rt Internal iliac artery Lt Internal iliac artery 50% stenosis Rt SFA mid 30% stenosis Lt SFA mid 80% stenosis prox 40% stenosis without pullback gradient Patent Lt PTA. Occluded Lt ATA  and peroneal artery. Peroneal artery reconstitutes with collaterals to have two vessel runoff at ankle level  Successful PTA 5.0 X 40 mm Mustang & 6.0 X 80 mm In.Pact Admiral DCB in left mid SFA 0% residual stenosis  Lower Extremity Arterial Duplex 12/23/2018: No hemodynamically significant stenoses are identified in the lower extremity arterial system. There is diffuse heterogeneous plaque in both lower extremities.  Mildly abnormal spectral waveforms of the right ankle. Normal PVR waveforms of the right ankle. Mildly abnormal spectral waveforms of the left ankle. Mildly abnormal PVR waveforms of the left ankle.  Mildly decreased right resting ABI 0.81. Moderately decreased left resting ABI 0.74.  Compared to 07/08/2018, left SFA stenosis not present, represents successful revascularization. No significant change in ABI.   Exercise sestamibi stress test 07/11/2018: 1. The resting electrocardiogram demonstrated normal sinus rhythm, normal resting conduction and no resting arrhythmias. LVH. The stress electrocardiogram was normal. Patient exercised on Bruce protocol for 7:15 minutes and achieved 8.24 METS. Stress test terminated due to fatigue and 88% MPHR achieved (Target HR >85%). Normal BP response. 2. Myocardial perfusion imaging is normal. Overall left ventricular systolic function was normal without regional wall motion abnormalities. The left ventricular ejection fraction was 62%. This is Hector [name] low risk study.  Echocardiogram 07/08/2018: Left ventricle cavity is normal in size. Moderate concentric hypertrophy of the left ventricle. Normal global wall motion. Normal diastolic filling pattern. Calculated EF 61%. Left atrial cavity is mildly dilated at 4.1 cm.  Assessment:   No diagnosis found.  EKG 06/17/2018: Normal sinus rhythm at rate of 81 bpm, left atrial enlargement, no evidence of ischemia. Borderline criteria for LVH. Otherwise normal EKG.  Recommendations:   I have discussed  recently obtained lower extremity duplex results with the patient, reveals successful revascularization of left SFA. ABI remains unchanged. He has had some improvement in leg pain and claudication symptoms. Would recommend continuing with DAPT for at least 6 months to  1 year. He will continue to need aggressive medical management. I am unsure of his recent lipid levels, will request from PCP office. Continue with statin therapy.  Blood pressure is well controlled. I have again strongly recommended that he work to quit smoking to further decrease his risk factors. Encouraged use of nicorette gum or nicotine patches to help.   Miquel Dunn, MSN, APRN, FNP-C [facility]. [place] Office: [phone] Fax: [phone]

## 2018-12-29 ENCOUNTER — Ambulatory Visit: Payer: BLUE CROSS/BLUE SHIELD | Admitting: Cardiology

## 2019-01-03 ENCOUNTER — Ambulatory Visit: Payer: BC Managed Care – PPO | Admitting: Cardiology

## 2019-01-03 NOTE — Progress Notes (Deleted)
Primary Physician:  Renaye RakersBland, Veita, MD   Patient ID: Hector [name] Flammer, male    DOB: 04/28/1956, 63 y.o.   MRN: [id number]  Subjective:    No chief complaint on file.   HPI: Hector [name] Hitchens  is [name] 63 y.o. male  with hypertension, hyperlipidemia, arthritis, ongoing tobacco use, PAD s/p PV angiogram on 09/06/2018 with PTA to left SFA.   Patient's main complaint was after getting off work he noticed that he did not have the energy to do things around his house like he used to [name] few years ago and felt fairly fatigued. Patient underwent exercise nuclear stress test on 07/11/2018 that was considered limited study.  Echocardiogram also performed at that time showed moderate LVH, normal LVEF.     This [name] 3 month virtual visit to discuss results. Continues to be on DAPT. Notices slight improvement in leg fatigue; however, continues to complain of left knee pain and occasional pain in his left hip. He states he needs someone to fix his knee.  Unfortunately, he does continue to smoke 1 pack per day  Reports found to have hypertension one year ago and has been well-controlled. He is on Crestor for management of hyperlipidemia that is stable. Denies any family history of heart disease. 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   Past Medical History:  Diagnosis Date  . Arthritis   . Claudication in peripheral vascular disease (HCC) 09/05/2018  . GERD (gastroesophageal reflux disease)   . Gout    L knee  . Hypertension     Past Surgical History:  Procedure Laterality Date  . ABDOMINAL AORTOGRAM W/LOWER EXTREMITY N/[name] 09/06/2018   Procedure: ABDOMINAL AORTOGRAM W/LOWER EXTREMITY;  Surgeon: Elder NegusPatwardhan, Manish J, MD;  Location: [facility];  Service: Cardiovascular;  Laterality: N/[name];  . FACIAL COSMETIC SURGERY     due ot shattered bones   . PERIPHERAL VASCULAR BALLOON ANGIOPLASTY  09/06/2018   Procedure: PERIPHERAL VASCULAR BALLOON  ANGIOPLASTY;  Surgeon: Elder NegusPatwardhan, Manish J, MD;  Location: [facility];  Service: Cardiovascular;;  LT SFA  . right hand fracture    . TOTAL KNEE ARTHROPLASTY Left 04/04/2013   Procedure: LEFT TOTAL KNEE ARTHROPLASTY;  Surgeon: Jacki Conesonald [name] Gioffre, MD;  Location: [facility];  Service: Orthopedics;  Laterality: Left;    Social History   Socioeconomic History  . Marital status: Single    Spouse name: Not on file  . Number of children: 3  . Years of education: Not on file  . Highest education level: Not on file  Occupational History  . Occupation: roofer  Social Needs  . Financial resource strain: Not on file  . Food insecurity    Worry: Not on file    Inability: Not on file  . Transportation needs    Medical: Not on file    Non-medical: Not on file  Tobacco Use  . Smoking status: Current Every Day Smoker    Packs/day: 1.00    Years: 39.00    Pack years: 39.00    Types: Cigarettes  . Smokeless tobacco: Never Used  Substance and Sexual Activity  . Alcohol use: Yes    Alcohol/week: 12.0 standard drinks    Types: 12 Cans of beer per week  . Drug use: No  . Sexual activity: Never    Birth control/protection: Abstinence  Lifestyle  . Physical activity    Days per week: Not on  file    Minutes per session: Not on file  . Stress: Not on file  Relationships  . Social Herbalist on phone: Not on file    Gets together: Not on file    Attends religious service: Not on file    Active member of club or organization: Not on file    Attends meetings of clubs or organizations: Not on file    Relationship status: Not on file  . Intimate partner violence    Fear of current or ex partner: Not on file    Emotionally abused: Not on file    Physically abused: Not on file    Forced sexual activity: Not on file  Other Topics Concern  . 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  Negative for decreased appetite, weight gain and weight loss.  Eyes: Negative for visual disturbance.  Cardiovascular: Positive for claudication (left leg with walking) and dyspnea on exertion (Decreased Exercise Tolerance). Negative for chest pain, leg swelling, orthopnea, palpitations and syncope.  Respiratory: Negative for hemoptysis and wheezing.   Endocrine: Negative for cold intolerance and heat intolerance.  Hematologic/Lymphatic: Does not bruise/bleed easily.  Skin: Negative for nail changes.  Musculoskeletal: Positive for joint pain (left knee and hip pain). Negative for muscle weakness and myalgias.  Gastrointestinal: Negative for abdominal pain, change in bowel habit, nausea and vomiting.  Neurological: Negative for difficulty with concentration, dizziness, focal weakness and headaches.  Psychiatric/Behavioral: Negative for altered mental status and suicidal ideas.  All other systems reviewed and are negative.     Objective:  There were no vitals taken for this visit. There is no height or weight on file to calculate BMI.   Physical exam not performed or limited due to virtual visit.  Patient appeared to be in no distress, Neck was supple, respiration was not labored.  Please see exam details from prior visit is as below.    Physical Exam  Constitutional: He is oriented to person, place, and time. He appears well-developed and well-nourished. No distress.  HENT:  Head: Atraumatic.  Eyes: Conjunctivae are normal.  Neck: Neck supple. No JVD present. No thyromegaly present.  Cardiovascular: Normal rate, regular rhythm and intact distal pulses. Exam reveals gallop and S4.  No murmur heard. Pulses:      Femoral pulses are 2+ on the right side and 2+ on the left side.      Popliteal pulses are 2+ on the right side and 2+ on the left side.       Dorsalis pedis pulses are 2+ on the right side and 1+ on the left side.       Posterior tibial pulses are 1+ on the right side and 2+  on the left side.  Small quarter sized hematoma to right groin. Normal pulse. No evidence of infection.   Pulmonary/Chest: Effort normal and breath sounds normal.  Abdominal: Soft. Bowel sounds are normal.  Musculoskeletal: Normal range of motion.        General: No edema.  Neurological: He is alert and oriented to person, place, and time.  Skin: Skin is warm and dry.  Psychiatric: He has [name] normal mood and affect.   Radiology: No results found.  Laboratory examination:   *** CMP Latest Ref Rng & Units 08/29/2018 10/17/2017 03/17/2014  Glucose 65 - 99 mg/dL 106(H) 96 95  BUN 8 - 27 mg/dL  9 13 10   Creatinine 0.76 - 1.27 mg/dL 1.611.07 0.960.95 0.450.83  Sodium 134 - 144 mmol/L 142 138 140  Potassium 3.5 - 5.2 mmol/L 4.1 4.3 4.4  Chloride 96 - 106 mmol/L 103 106 106  CO2 20 - 29 mmol/L 23 22 21   Calcium 8.6 - 10.2 mg/dL 9.6 9.2 9.8  Total Protein 6.0 - 8.3 g/dL - - 7.0  Total Bilirubin 0.2 - 1.2 mg/dL - - 0.8  Alkaline Phos 39 - 117 U/L - - 101  AST 0 - 37 U/L - - 23  ALT 0 - 53 U/L - - 28   CBC Latest Ref Rng & Units 08/29/2018 10/17/2017 03/17/2014  WBC 3.4 - 10.8 x10E3/uL 8.9 9.2 8.5  Hemoglobin 13.0 - 17.7 g/dL 40.916.1 81.115.2 91.415.7  Hematocrit 37.5 - 51.0 % 48.7 45.5 48.4  Platelets 150 - 450 x10E3/uL 303 308 -   Lipid Panel  No results found for: CHOL, TRIG, HDL, CHOLHDL, VLDL, LDLCALC, LDLDIRECT HEMOGLOBIN A1C No results found for: HGBA1C, MPG TSH No results for input(s): TSH in the last 8760 hours.  PRN Meds:. There are no discontinued medications. No outpatient medications have been marked as taking for the 01/03/19 encounter (Appointment) with Toniann FailKelley, Samari Gorby Haynes, NP.    Cardiac Studies:   PV/ Abdominal aortogram 09/06/18: Patent renal arteries Normal distal abdominal aorta & bilateral EIA and Rt Internal iliac artery Lt Internal iliac artery 50% stenosis Rt SFA mid 30% stenosis Lt SFA mid 80% stenosis prox 40% stenosis without pullback gradient Patent Lt PTA. Occluded Lt ATA  and peroneal artery. Peroneal artery reconstitutes with collaterals to have two vessel runoff at ankle level  Successful PTA 5.0 X 40 mm Mustang & 6.0 X 80 mm In.Pact Admiral DCB in left mid SFA 0% residual stenosis  Lower Extremity Arterial Duplex 12/23/2018: No hemodynamically significant stenoses are identified in the lower extremity arterial system. There is diffuse heterogeneous plaque in both lower extremities.  Mildly abnormal spectral waveforms of the right ankle. Normal PVR waveforms of the right ankle. Mildly abnormal spectral waveforms of the left ankle. Mildly abnormal PVR waveforms of the left ankle.  Mildly decreased right resting ABI 0.81. Moderately decreased left resting ABI 0.74.  Compared to 07/08/2018, left SFA stenosis not present, represents successful revascularization. No significant change in ABI.   Exercise sestamibi stress test 07/11/2018: 1. The resting electrocardiogram demonstrated normal sinus rhythm, normal resting conduction and no resting arrhythmias. LVH. The stress electrocardiogram was normal. Patient exercised on Bruce protocol for 7:15 minutes and achieved 8.24 METS. Stress test terminated due to fatigue and 88% MPHR achieved (Target HR >85%). Normal BP response. 2. Myocardial perfusion imaging is normal. Overall left ventricular systolic function was normal without regional wall motion abnormalities. The left ventricular ejection fraction was 62%. This is [name] low risk study.  Echocardiogram 07/08/2018: Left ventricle cavity is normal in size. Moderate concentric hypertrophy of the left ventricle. Normal global wall motion. Normal diastolic filling pattern. Calculated EF 61%. Left atrial cavity is mildly dilated at 4.1 cm.  Assessment:   No diagnosis found.  EKG 06/17/2018: Normal sinus rhythm at rate of 81 bpm, left atrial enlargement, no evidence of ischemia. Borderline criteria for LVH. Otherwise normal EKG.  Recommendations:   I have discussed  recently obtained lower extremity duplex results with the patient, reveals successful revascularization of left SFA. ABI remains unchanged. He has had some improvement in leg pain and claudication symptoms. Would recommend continuing with DAPT for at least 6 months to  1 year. He will continue to need aggressive medical management. I am unsure of his recent lipid levels, will request from PCP office. Continue with statin therapy.  Blood pressure is well controlled. I have again strongly recommended that he work to quit smoking to further decrease his risk factors. Encouraged use of nicorette gum or nicotine patches to help.   Miquel Dunn, MSN, APRN, FNP-C [facility]. [place] Office: [phone] Fax: [phone]

## 2019-01-04 ENCOUNTER — Encounter: Payer: Self-pay | Admitting: Cardiology

## 2019-01-04 ENCOUNTER — Other Ambulatory Visit: Payer: Self-pay

## 2019-01-04 ENCOUNTER — Ambulatory Visit (INDEPENDENT_AMBULATORY_CARE_PROVIDER_SITE_OTHER): Payer: BC Managed Care – PPO | Admitting: Cardiology

## 2019-01-04 VITALS — BP 116/82 | HR 96 | Ht 69.0 in | Wt 183.4 lb

## 2019-01-04 DIAGNOSIS — M25562 Pain in left knee: Secondary | ICD-10-CM | POA: Diagnosis not present

## 2019-01-04 DIAGNOSIS — F17200 Nicotine dependence, unspecified, uncomplicated: Secondary | ICD-10-CM

## 2019-01-04 DIAGNOSIS — I739 Peripheral vascular disease, unspecified: Secondary | ICD-10-CM

## 2019-01-04 DIAGNOSIS — G8929 Other chronic pain: Secondary | ICD-10-CM

## 2019-01-04 DIAGNOSIS — R5383 Other fatigue: Secondary | ICD-10-CM

## 2019-01-04 NOTE — Progress Notes (Signed)
Primary Physician:  Lucianne Lei, MD   Patient ID: Hector [name], male    DOB: 1956-03-31, 63 y.o.   MRN: [id number]  Subjective:    Chief Complaint  Patient presents with  . PAD  . Results  . Follow-up    4mo    HPI: Hector [name]  is a 63 y.o. male  with hypertension, hyperlipidemia, arthritis, ongoing tobacco use, PAD s/p PV angiogram on 09/06/2018 with PTA to left SFA.   Due to fatigue, patient underwent exercise nuclear stress test on 07/11/2018 that was considered limited study.  Echocardiogram also performed at that time showed moderate LVH, normal LVEF.     This a 3 month virtual visit to discuss LE results. Continues to be on DAPT. Notices slight improvement in leg fatigue and pain; however, continues to have significant pain in his left knee.   Unfortunately, he does continue to smoke 1 pack per day  Hypertension is well controlled.He is on Crestor for management of hyperlipidemia that is stable. Denies any family history of heart disease. He does not exercise. He previously worked as a Theme park manager, but has been out of work due to [employer] for the last few months.    Past Medical History:  Diagnosis Date  . Arthritis   . Claudication in peripheral vascular disease ([place]) 09/05/2018  . GERD (gastroesophageal reflux disease)   . Gout    L knee  . Hypertension     Past Surgical History:  Procedure Laterality Date  . ABDOMINAL AORTOGRAM W/LOWER EXTREMITY N/A 09/06/2018   Procedure: ABDOMINAL AORTOGRAM W/LOWER EXTREMITY;  Surgeon: Nigel Mormon, MD;  Location: [place] [facility];  Service: Cardiovascular;  Laterality: N/A;  . FACIAL COSMETIC SURGERY     due ot shattered bones   . PERIPHERAL VASCULAR BALLOON ANGIOPLASTY  09/06/2018   Procedure: PERIPHERAL VASCULAR BALLOON ANGIOPLASTY;  Surgeon: Nigel Mormon, MD;  Location: [place] [facility];  Service: Cardiovascular;;  LT SFA  . right hand fracture    . TOTAL KNEE ARTHROPLASTY Left 04/04/2013   Procedure:  LEFT TOTAL KNEE ARTHROPLASTY;  Surgeon: Tobi Bastos, MD;  Location: [facility];  Service: Orthopedics;  Laterality: Left;    Social History   Socioeconomic History  . Marital status: Single    Spouse name: Not on file  . Number of children: 3  . Years of education: Not on file  . Highest education level: Not on file  Occupational History  . Occupation: roofer  Social Needs  . Financial resource strain: Not on file  . Food insecurity    Worry: Not on file    Inability: Not on file  . Transportation needs    Medical: Not on file    Non-medical: Not on file  Tobacco Use  . Smoking status: Current Every Day Smoker    Packs/day: 1.00    Years: 39.00    Pack years: 39.00    Types: Cigarettes  . Smokeless tobacco: Never Used  Substance and Sexual Activity  . Alcohol use: Yes    Alcohol/week: 12.0 standard drinks    Types: 12 Cans of beer per week    Comment: daily beer, socially  . Drug use: No  . Sexual activity: Never    Birth control/protection: Abstinence  Lifestyle  . Physical activity    Days per week: Not on file    Minutes per session: Not on file  . Stress: Not on file  Relationships  . Social connections  Talks on phone: Not on file    Gets together: Not on file    Attends religious service: Not on file    Active member of club or organization: Not on file    Attends meetings of clubs or organizations: Not on file    Relationship status: Not on file  . Intimate partner violence    Fear of current or ex partner: Not on file    Emotionally abused: Not on file    Physically abused: Not on file    Forced sexual activity: Not on file  Other Topics Concern  . 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 Negative for decreased appetite, weight gain and weight loss.  Eyes: Negative for visual disturbance.  Cardiovascular: Negative for chest pain, claudication,  dyspnea on exertion (Decreased Exercise Tolerance), leg swelling, orthopnea, palpitations and syncope.  Respiratory: Negative for hemoptysis and wheezing.   Endocrine: Negative for cold intolerance and heat intolerance.  Hematologic/Lymphatic: Does not bruise/bleed easily.  Skin: Negative for nail changes.  Musculoskeletal: Positive for joint pain (left knee pain). Negative for muscle weakness and myalgias.  Gastrointestinal: Negative for abdominal pain, change in bowel habit, nausea and vomiting.  Neurological: Negative for difficulty with concentration, dizziness, focal weakness and headaches.  Psychiatric/Behavioral: Negative for altered mental status and suicidal ideas.  All other systems reviewed and are negative.     Objective:  Blood pressure 116/82, pulse 96, height 5\' 9"  (1.753 m), weight 183 lb 6.4 oz (83.2 kg), SpO2 93 %. Body mass index is 27.08 kg/m.    Physical Exam  Constitutional: He is oriented to person, place, and time. He appears well-developed and well-nourished. No distress.  HENT:  Head: Atraumatic.  Eyes: Conjunctivae are normal.  Neck: Neck supple. No JVD present. No thyromegaly present.  Cardiovascular: Normal rate, regular rhythm and intact distal pulses. Exam reveals gallop and S4.  No murmur heard. Pulses:      Femoral pulses are 2+ on the right side and 2+ on the left side.      Popliteal pulses are 2+ on the right side and 2+ on the left side.       Dorsalis pedis pulses are 2+ on the right side and 1+ on the left side.       Posterior tibial pulses are 1+ on the right side and 2+ on the left side.  Small quarter sized hematoma to right groin. Normal pulse. No evidence of infection.   Pulmonary/Chest: Effort normal and breath sounds normal.  Abdominal: Soft. Bowel sounds are normal.  Musculoskeletal: Normal range of motion.        General: No edema.  Neurological: He is alert and oriented to person, place, and time.  Skin: Skin is warm and dry.   Psychiatric: He has a normal mood and affect.   Radiology: No results found.  Laboratory examination:    CMP Latest Ref Rng & Units 08/29/2018 10/17/2017 03/17/2014  Glucose 65 - 99 mg/dL 161(W106(H) 96 95  BUN 8 - 27 mg/dL 9 13 10   Creatinine 0.76 - 1.27 mg/dL 9.601.07 4.540.95 0.980.83  Sodium 134 - 144 mmol/L 142 138 140  Potassium 3.5 - 5.2 mmol/L 4.1 4.3 4.4  Chloride 96 - 106 mmol/L 103 106 106  CO2 20 - 29 mmol/L 23 22 21   Calcium 8.6 - 10.2 mg/dL 9.6 9.2 9.8  Total Protein 6.0 - 8.3 g/dL - -  7.0  Total Bilirubin 0.2 - 1.2 mg/dL - - 0.8  Alkaline Phos 39 - 117 U/L - - 101  AST 0 - 37 U/L - - 23  ALT 0 - 53 U/L - - 28   CBC Latest Ref Rng & Units 08/29/2018 10/17/2017 03/17/2014  WBC 3.4 - 10.8 x10E3/uL 8.9 9.2 8.5  Hemoglobin 13.0 - 17.7 g/dL 16.116.1 09.615.2 04.515.7  Hematocrit 37.5 - 51.0 % 48.7 45.5 48.4  Platelets 150 - 450 x10E3/uL 303 308 -   Lipid Panel  No results found for: CHOL, TRIG, HDL, CHOLHDL, VLDL, LDLCALC, LDLDIRECT HEMOGLOBIN A1C No results found for: HGBA1C, MPG TSH No results for input(s): TSH in the last 8760 hours.  PRN Meds:. There are no discontinued medications. Current Meds  Medication Sig  . amLODipine (NORVASC) 10 MG tablet Take 10 mg by mouth daily.  Marland Kitchen. aspirin EC 81 MG tablet Take 1 tablet (81 mg total) by mouth daily.  . clopidogrel (PLAVIX) 75 MG tablet Take 1 tablet (75 mg total) by mouth daily.  . fexofenadine (ALLEGRA) 180 MG tablet Take 180 mg by mouth as needed.   Marland Kitchen. lisinopril-hydrochlorothiazide (PRINZIDE,ZESTORETIC) 20-12.5 MG tablet Take 1 tablet by mouth daily.  . Multiple Vitamins-Minerals (MULTIVITAMIN MEN 50+ PO) Take by mouth daily.  Marland Kitchen. omeprazole (PRILOSEC OTC) 20 MG tablet Take 20 mg by mouth daily.  . rosuvastatin (CRESTOR) 10 MG tablet Take 10 mg by mouth daily.  . tamsulosin (FLOMAX) 0.4 MG CAPS capsule Take 0.4 mg by mouth daily after supper.    Cardiac Studies:   PV/ Abdominal aortogram 09/06/18: Patent renal arteries Normal distal  abdominal aorta & bilateral EIA and Rt Internal iliac artery Lt Internal iliac artery 50% stenosis Rt SFA mid 30% stenosis Lt SFA mid 80% stenosis prox 40% stenosis without pullback gradient Patent Lt PTA. Occluded Lt ATA and peroneal artery. Peroneal artery reconstitutes with collaterals to have two vessel runoff at ankle level  Successful PTA 5.0 X 40 mm Mustang & 6.0 X 80 mm In.Pact Admiral DCB in left mid SFA 0% residual stenosis  Lower Extremity Arterial Duplex 12/23/2018: No hemodynamically significant stenoses are identified in the lower extremity arterial system. There is diffuse heterogeneous plaque in both lower extremities.  Mildly abnormal spectral waveforms of the right ankle. Normal PVR waveforms of the right ankle. Mildly abnormal spectral waveforms of the left ankle. Mildly abnormal PVR waveforms of the left ankle.  Mildly decreased right resting ABI 0.81. Moderately decreased left resting ABI 0.74.  Compared to 07/08/2018, left SFA stenosis not present, represents successful revascularization. No significant change in ABI.   Exercise sestamibi stress test 07/11/2018: 1. The resting electrocardiogram demonstrated normal sinus rhythm, normal resting conduction and no resting arrhythmias. LVH. The stress electrocardiogram was normal. Patient exercised on Bruce protocol for 7:15 minutes and achieved 8.24 METS. Stress test terminated due to fatigue and 88% MPHR achieved (Target HR >85%). Normal BP response. 2. Myocardial perfusion imaging is normal. Overall left ventricular systolic function was normal without regional wall motion abnormalities. The left ventricular ejection fraction was 62%. This is a low risk study.  Echocardiogram 07/08/2018: Left ventricle cavity is normal in size. Moderate concentric hypertrophy of the left ventricle. Normal global wall motion. Normal diastolic filling pattern. Calculated EF 61%. Left atrial cavity is mildly dilated at 4.1 cm.  Assessment:      ICD-10-CM   1. PAD (peripheral artery disease) (HCC)  I73.9 EKG 12-Lead  2. Fatigue, unspecified type  R53.83   3. Tobacco  use disorder  F17.200     EKG 01/04/2019: Normal sinus rhythm at rate of 81 bpm, left atrial enlargement, no evidence of ischemia. Borderline criteria for LVH. Otherwise normal EKG. No change from EKG 06/17/2018.  Recommendations:   I have discussed recently obtained lower extremity duplex results with the patient, reveals successful revascularization of left SFA. ABI remains unchanged. He has had some improvement in leg pain and claudication symptoms.Vascualr exam is also improved. Would recommend continuing with DAPT for at least 6 months, then will consider discontinuing this and continuing with aspirin indefinitely. He will continue to need aggressive medical management. I am unsure of his recent lipid levels, will request from PCP office. Continue with statin therapy.  Blood pressure is well controlled. I have again strongly recommended that he work to quit smoking to further decrease his risk factors. I have encouraged him to try using nicorette patches to help with this. He has set a goal to at least be cut back to 0.25 a pack by his next office visit. We will see him back in 6 months or sooner if needed.  Toniann FailAshton Haynes , MSN, APRN, FNP-C [facility] [place] Office: [phone] Fax: [phone]

## 2019-03-28 DIAGNOSIS — M130 Polyarthritis, unspecified: Secondary | ICD-10-CM | POA: Diagnosis not present

## 2019-03-28 DIAGNOSIS — Z716 Tobacco abuse counseling: Secondary | ICD-10-CM | POA: Diagnosis not present

## 2019-03-28 DIAGNOSIS — I10 Essential (primary) hypertension: Secondary | ICD-10-CM | POA: Diagnosis not present

## 2019-03-28 DIAGNOSIS — M10071 Idiopathic gout, right ankle and foot: Secondary | ICD-10-CM | POA: Diagnosis not present

## 2019-05-22 ENCOUNTER — Other Ambulatory Visit: Payer: Self-pay

## 2019-05-22 DIAGNOSIS — Z20822 Contact with and (suspected) exposure to covid-19: Secondary | ICD-10-CM

## 2019-05-24 LAB — NOVEL CORONAVIRUS, NAA: SARS-CoV-2, NAA: NOT DETECTED

## 2019-07-10 ENCOUNTER — Other Ambulatory Visit: Payer: Self-pay

## 2019-07-10 ENCOUNTER — Ambulatory Visit (INDEPENDENT_AMBULATORY_CARE_PROVIDER_SITE_OTHER): Payer: 59 | Admitting: Cardiology

## 2019-07-10 ENCOUNTER — Encounter: Payer: Self-pay | Admitting: Cardiology

## 2019-07-10 VITALS — BP 123/86 | HR 100 | Temp 97.7°F | Ht 69.0 in | Wt 183.0 lb

## 2019-07-10 DIAGNOSIS — I739 Peripheral vascular disease, unspecified: Secondary | ICD-10-CM | POA: Diagnosis not present

## 2019-07-10 DIAGNOSIS — F1210 Cannabis abuse, uncomplicated: Secondary | ICD-10-CM | POA: Diagnosis not present

## 2019-07-10 DIAGNOSIS — F17200 Nicotine dependence, unspecified, uncomplicated: Secondary | ICD-10-CM

## 2019-07-10 DIAGNOSIS — I10 Essential (primary) hypertension: Secondary | ICD-10-CM

## 2019-07-10 NOTE — Progress Notes (Signed)
Primary Physician:  Renaye Rakers, MD   Patient ID: Hector [name], male    DOB: 08/26/1955, 64 y.o.   MRN: [id number]  Subjective:    Chief Complaint  Patient presents with  . PAD    6 month f/u    HPI: Hector [name]  is a 64 y.o. male  with hypertension, hyperlipidemia, arthritis, ongoing tobacco use, PAD s/p PV angiogram on 09/06/2018 with PTA to left SFA.   Due to fatigue, patient underwent exercise nuclear stress test on 07/11/2018 that was considered limited study.  Echocardiogram also performed at that time showed moderate LVH, normal LVEF.     This a 6 month office visit. Continues to be on DAPT. He denies any fatigue or cramping in his left leg like prior to his procedure. He does continue to have issues with his left knee. He is also mentioning intermittent left foot tingling/numbness.   Unfortunately, he does continue to smoke 1 pack per day, but states that this is down from 1.5 packs per day.   Hypertension is well controlled.He is on Crestor for management of hyperlipidemia that is stable. He is to be evaluated with his PCP later this month. Denies any family history of heart disease. He does not exercise. He previously worked as a Designer, fashion/clothing, but has been out of work due to [employer] for the last few months.    Past Medical History:  Diagnosis Date  . Arthritis   . Claudication in peripheral vascular disease (HCC) 09/05/2018  . GERD (gastroesophageal reflux disease)   . Gout    L knee  . Hypertension     Past Surgical History:  Procedure Laterality Date  . ABDOMINAL AORTOGRAM W/LOWER EXTREMITY N/A 09/06/2018   Procedure: ABDOMINAL AORTOGRAM W/LOWER EXTREMITY;  Surgeon: Elder Negus, MD;  Location: [facility];  Service: Cardiovascular;  Laterality: N/A;  . FACIAL COSMETIC SURGERY     due ot shattered bones   . PERIPHERAL VASCULAR BALLOON ANGIOPLASTY  09/06/2018   Procedure: PERIPHERAL VASCULAR BALLOON ANGIOPLASTY;  Surgeon: Elder Negus, MD;   Location: [facility];  Service: Cardiovascular;;  LT SFA  . right hand fracture    . TOTAL KNEE ARTHROPLASTY Left 04/04/2013   Procedure: LEFT TOTAL KNEE ARTHROPLASTY;  Surgeon: Jacki Cones, MD;  Location: [facility];  Service: Orthopedics;  Laterality: Left;    Social History   Socioeconomic History  . Marital status: Single    Spouse name: Not on file  . Number of children: 3  . Years of education: Not on file  . Highest education level: Not on file  Occupational History  . Occupation: roofer  Tobacco Use  . Smoking status: Current Every Day Smoker    Packs/day: 1.00    Years: 39.00    Pack years: 39.00    Types: Cigarettes  . Smokeless tobacco: Never Used  Substance and Sexual Activity  . Alcohol use: Yes    Alcohol/week: 12.0 standard drinks    Types: 12 Cans of beer per week    Comment: daily beer, socially  . Drug use: No  . Sexual activity: Never    Birth control/protection: Abstinence  Other Topics Concern  . 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 Difficulty of Paying Living Expenses: Not on file  Food Insecurity:   . Worried About Programme researcher, broadcasting/film/video in  the Last Year: Not on file  . Ran Out of Food in the Last Year: Not on file  Transportation Needs:   . Lack of Transportation (Medical): Not on file  . Lack of Transportation (Non-Medical): Not on file  Physical Activity:   . Days of Exercise per Week: Not on file  . Minutes of Exercise per Session: Not on file  Stress:   . Feeling of Stress : Not on file  Social Connections:   . Frequency of Communication with Friends and Family: Not on file  . Frequency of Social Gatherings with Friends and Family: Not on file  . Attends Religious Services: Not on file  . Active Member of Clubs or Organizations: Not on file  . Attends Archivist Meetings: Not on file  . Marital Status: Not on file    Intimate Partner Violence:   . Fear of Current or Ex-Partner: Not on file  . Emotionally Abused: Not on file  . Physically Abused: Not on file  . Sexually Abused: Not on file    Review of Systems  Constitution: Positive for malaise/fatigue. Negative for decreased appetite, weight gain and weight loss.  Eyes: Negative for visual disturbance.  Cardiovascular: Negative for chest pain, claudication, dyspnea on exertion (Decreased Exercise Tolerance), leg swelling, orthopnea, palpitations and syncope.  Respiratory: Negative for hemoptysis and wheezing.   Endocrine: Negative for cold intolerance and heat intolerance.  Hematologic/Lymphatic: Does not bruise/bleed easily.  Skin: Negative for nail changes.  Musculoskeletal: Positive for joint pain (left knee pain). Negative for muscle weakness and myalgias.  Gastrointestinal: Negative for abdominal pain, change in bowel habit, nausea and vomiting.  Neurological: Negative for difficulty with concentration, dizziness, focal weakness and headaches.  Psychiatric/Behavioral: Negative for altered mental status and suicidal ideas.  All other systems reviewed and are negative.     Objective:  Blood pressure 123/86, pulse 100, temperature 97.7 F (36.5 C), height 5\' 9"  (1.753 m), weight 183 lb (83 kg), SpO2 93 %. Body mass index is 27.02 kg/m.    Physical Exam  Constitutional: He is oriented to person, place, and time. He appears well-developed and well-nourished. No distress.  HENT:  Head: Atraumatic.  Eyes: Conjunctivae are normal.  Neck: No JVD present. No thyromegaly present.  Cardiovascular: Normal rate, regular rhythm and intact distal pulses. Exam reveals gallop and S4.  No murmur heard. Pulses:      Femoral pulses are 2+ on the right side and 2+ on the left side.      Popliteal pulses are 2+ on the right side and 2+ on the left side.       Dorsalis pedis pulses are 2+ on the right side and 1+ on the left side.       Posterior tibial  pulses are 1+ on the right side and 2+ on the left side.     Pulmonary/Chest: Effort normal and breath sounds normal.  Abdominal: Soft. Bowel sounds are normal.  Musculoskeletal:        General: No edema. Normal range of motion.     Cervical back: Neck supple.  Neurological: He is alert and oriented to person, place, and time.  Skin: Skin is warm and dry.  Psychiatric: He has a normal mood and affect.   Radiology: No results found.  Laboratory examination:    CMP Latest Ref Rng & Units 08/29/2018 10/17/2017 03/17/2014  Glucose 65 - 99 mg/dL 106(H) 96 95  BUN 8 - 27 mg/dL 9 13 10   Creatinine 0.76 -  1.27 mg/dL 2.95 1.88 4.16  Sodium 134 - 144 mmol/L 142 138 140  Potassium 3.5 - 5.2 mmol/L 4.1 4.3 4.4  Chloride 96 - 106 mmol/L 103 106 106  CO2 20 - 29 mmol/L 23 22 21   Calcium 8.6 - 10.2 mg/dL 9.6 9.2 9.8  Total Protein 6.0 - 8.3 g/dL - - 7.0  Total Bilirubin 0.2 - 1.2 mg/dL - - 0.8  Alkaline Phos 39 - 117 U/L - - 101  AST 0 - 37 U/L - - 23  ALT 0 - 53 U/L - - 28   CBC Latest Ref Rng & Units 08/29/2018 10/17/2017 03/17/2014  WBC 3.4 - 10.8 x10E3/uL 8.9 9.2 8.5  Hemoglobin 13.0 - 17.7 g/dL 05/17/2014 60.6 30.1  Hematocrit 37.5 - 51.0 % 48.7 45.5 48.4  Platelets 150 - 450 x10E3/uL 303 308 -   Lipid Panel  No results found for: CHOL, TRIG, HDL, CHOLHDL, VLDL, LDLCALC, LDLDIRECT HEMOGLOBIN A1C No results found for: HGBA1C, MPG TSH No results for input(s): TSH in the last 8760 hours.  PRN Meds:. There are no discontinued medications. Current Meds  Medication Sig  . amLODipine (NORVASC) 10 MG tablet Take 10 mg by mouth daily.  60.1 aspirin EC 81 MG tablet Take 1 tablet (81 mg total) by mouth daily.  . clopidogrel (PLAVIX) 75 MG tablet Take 1 tablet (75 mg total) by mouth daily.  . fexofenadine (ALLEGRA) 180 MG tablet Take 180 mg by mouth as needed.   Marland Kitchen lisinopril-hydrochlorothiazide (PRINZIDE,ZESTORETIC) 20-12.5 MG tablet Take 1 tablet by mouth daily.  . Multiple Vitamins-Minerals  (MULTIVITAMIN MEN 50+ PO) Take by mouth daily.  Marland Kitchen omeprazole (PRILOSEC OTC) 20 MG tablet Take 20 mg by mouth daily.  . rosuvastatin (CRESTOR) 10 MG tablet Take 10 mg by mouth daily.  . tamsulosin (FLOMAX) 0.4 MG CAPS capsule Take 0.4 mg by mouth daily after supper.    Cardiac Studies:   PV/ Abdominal aortogram 09/06/18: Patent renal arteries Normal distal abdominal aorta & bilateral EIA and Rt Internal iliac artery Lt Internal iliac artery 50% stenosis Rt SFA mid 30% stenosis Lt SFA mid 80% stenosis prox 40% stenosis without pullback gradient Patent Lt PTA. Occluded Lt ATA and peroneal artery. Peroneal artery reconstitutes with collaterals to have two vessel runoff at ankle level  Successful PTA 5.0 X 40 mm Mustang & 6.0 X 80 mm In.Pact Admiral DCB in left mid SFA 0% residual stenosis  Lower Extremity Arterial Duplex 12/23/2018: No hemodynamically significant stenoses are identified in the lower extremity arterial system. There is diffuse heterogeneous plaque in both lower extremities.  Mildly abnormal spectral waveforms of the right ankle. Normal PVR waveforms of the right ankle. Mildly abnormal spectral waveforms of the left ankle. Mildly abnormal PVR waveforms of the left ankle.  Mildly decreased right resting ABI 0.81. Moderately decreased left resting ABI 0.74.  Compared to 07/08/2018, left SFA stenosis not present, represents successful revascularization. No significant change in ABI.   Exercise sestamibi stress test 07/11/2018: 1. The resting electrocardiogram demonstrated normal sinus rhythm, normal resting conduction and no resting arrhythmias. LVH. The stress electrocardiogram was normal. Patient exercised on Bruce protocol for 7:15 minutes and achieved 8.24 METS. Stress test terminated due to fatigue and 88% MPHR achieved (Target HR >85%). Normal BP response. 2. Myocardial perfusion imaging is normal. Overall left ventricular systolic function was normal without regional wall  motion abnormalities. The left ventricular ejection fraction was 62%. This is a low risk study.  Echocardiogram 07/08/2018: Left ventricle cavity is normal  in size. Moderate concentric hypertrophy of the left ventricle. Normal global wall motion. Normal diastolic filling pattern. Calculated EF 61%. Left atrial cavity is mildly dilated at 4.1 cm.  Assessment:     ICD-10-CM   1. PAD (peripheral artery disease) (HCC)  I73.9 EKG 12-Lead    PCV LOWER ARTERIAL (BILATERAL)  2. Primary hypertension  I10   3. Tobacco use disorder  F17.200     EKG 07/10/2019: Normal sinus rhythm at rate of 91 bpm, left atrial enlargement, no evidence of ischemia. Borderline criteria for LVH. Otherwise normal EKG. No change from EKG 01/04/2019  Recommendations:   Patient is here for 6 month follow up. Doing well without any significant complaints. Vascular exam is unchanged. He does mention intermittent left foot tingling/numbness. No evidence of ischemia. He is due for repeat lower extremity duplex for surveillance, will arrange this and notify him of results. He continues to be on DAPT, advised him that he may stop Plavix in March and continue with Aspirin indefinitely.   Blood pressure is well controlled. He reports he will have labs performed in the next few weeks with his PCP to follow up on hyperlipidemia. Will request that results be sent to [place] for our records.   I have congratulated him on being able to cut back on his tobacco use, but strongly urged him to continue to work on complete smoking cessation. I will see him back in 6 months for follow up on PAD and smoking cessation.   Toniann Fail, MSN, APRN, FNP-C [facility] Office: [phone] Fax: [phone]

## 2019-07-10 NOTE — Patient Instructions (Signed)
You can stop Plavix in March as it will be 1 year since your procedure. Continue with Aspirin 81 mg daily.   Continue to work on smoking cessation

## 2019-08-11 ENCOUNTER — Telehealth: Payer: Self-pay

## 2019-08-11 NOTE — Telephone Encounter (Signed)
Patient called wanting clarification on stopping Plavix . I discussed with him Rogelia Boga directions for stopping this medication per her last office note. Patient can stop Plavix in March as it will be 1 year since your procedure. Continue with Aspirin 81 mg daily. Patient verbalized understanding.

## 2019-08-30 ENCOUNTER — Other Ambulatory Visit: Payer: Self-pay | Admitting: Cardiology

## 2019-12-25 ENCOUNTER — Ambulatory Visit: Payer: 59

## 2019-12-25 ENCOUNTER — Other Ambulatory Visit: Payer: Self-pay

## 2019-12-25 DIAGNOSIS — I739 Peripheral vascular disease, unspecified: Secondary | ICD-10-CM

## 2020-01-01 NOTE — Progress Notes (Signed)
I could see him for f/u as I know him otherwise ST is okay

## 2020-01-01 NOTE — Progress Notes (Signed)
Mild progression of the disease in the left popliteal artery, depending upon his symptoms we could consider intervention.

## 2020-01-04 ENCOUNTER — Telehealth: Payer: Self-pay

## 2020-01-04 NOTE — Telephone Encounter (Signed)
-----   Message from Yates Decamp, MD sent at 01/01/2020  3:07 PM EDT ----- I could see him for f/u as I know him otherwise ST is okay

## 2020-01-04 NOTE — Telephone Encounter (Signed)
Gave results to patient. He verbalized understanding. 

## 2020-01-05 ENCOUNTER — Ambulatory Visit: Payer: 59 | Admitting: Cardiology

## 2020-01-05 ENCOUNTER — Other Ambulatory Visit: Payer: Self-pay

## 2020-01-05 ENCOUNTER — Encounter: Payer: Self-pay | Admitting: Cardiology

## 2020-01-05 VITALS — BP 139/86 | HR 94 | Resp 17 | Ht 69.0 in | Wt 190.0 lb

## 2020-01-05 DIAGNOSIS — F17200 Nicotine dependence, unspecified, uncomplicated: Secondary | ICD-10-CM

## 2020-01-05 DIAGNOSIS — I10 Essential (primary) hypertension: Secondary | ICD-10-CM

## 2020-01-05 DIAGNOSIS — I739 Peripheral vascular disease, unspecified: Secondary | ICD-10-CM

## 2020-01-05 DIAGNOSIS — E782 Mixed hyperlipidemia: Secondary | ICD-10-CM

## 2020-01-05 DIAGNOSIS — Z9862 Peripheral vascular angioplasty status: Secondary | ICD-10-CM

## 2020-01-05 DIAGNOSIS — F1010 Alcohol abuse, uncomplicated: Secondary | ICD-10-CM

## 2020-01-05 MED ORDER — CILOSTAZOL 50 MG PO TABS: 50.0000 mg | ORAL_TABLET | Freq: Two times a day (BID) | ORAL | 0 refills | Status: DC

## 2020-01-05 MED ORDER — ROSUVASTATIN CALCIUM 20 MG PO TABS: 20.0000 mg | ORAL_TABLET | Freq: Every day | ORAL | 0 refills | Status: DC

## 2020-01-05 NOTE — Progress Notes (Signed)
Hector [name] Date of Birth: [birth year] MRN: [id number] Primary Care Provider:Bland, Adrian Saran, MD Former Cardiology Providers: Alt[place], APRN, FNP-C, Dr. Yates Decamp  Primary Cardiologist: Tessa Lerner, DO, [facility] (established care 01/05/2020)  Date: 01/08/20 Last Visit: 07/10/2019  Chief Complaint  Patient presents with  . PAD  . Nicotine Dependence  . Follow-up    6 month    HPI  Hector [name] is a 64 y.o.  male who presents to the office with a chief complaint of " reevaluation of peripheral artery disease and review test results." Patient's past medical history and cardiovascular risk factors include: Establish peripheral artery disease with prior intervention and claudication, mixed hyperlipidemia, hypertension, alcohol abuse, and tobacco use disorder.  Patient was formally under the care of Dr. Rudean Hitt, APRN, FNP-C for evaluation and management of peripheral artery disease.  Patient had abdominal aortic runoff which noted peripheral artery disease in bilateral lower extremity.  Left SFA was noted to have 80% stenosis in the midsegment at 40% of the proximal segment without gradient.  He underwent successful PTA with 5 x 40 mm Mustang and 6 x 80 mm drug-coated balloon in the mid left SFA with 0% residual stenosis.  He recently had lower extremity arterial duplex results noted below for further reference and discussed with him at today's office visit.  In regards to symptoms he continues to have claudication in bilateral lower extremity left worse than the right.  However the intensity of the claudication has reduced significantly since the last intervention.  Patient states that the pain is usually in the infrapopliteal region and improves after resting for some time and gets worse with effort related activities.  He is also undergone extensive cardiovascular work-up including an echocardiogram and stress test.  The echocardiogram in January 2020 noted preserved LVEF with  normal diastolic filling pattern and no valvular heart disease.  And he had exercise nuclear stress test which noted normal myocardial perfusion per report and gated SPECT LVEF 62%.  Overall low risk study.  Since last office visit no hospitalizations or urgent care visits for cardiovascular symptoms.  Unfortunately patient continues to smoke 1 pack/day.  His overall functional status has been limited since he has been laid off.  History of PAD with prior intervention as noted below. Denies prior history of coronary artery disease, myocardial infarction, congestive heart failure, deep venous thrombosis, pulmonary embolism, stroke, transient ischemic attack.  FUNCTIONAL STATUS: No structured exercise program or daily routine. 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    ALLERGIES: No Known Allergies   MEDICATION LIST PRIOR TO VISIT: Current Outpatient Medications on File Prior to Visit  Medication Sig Dispense Refill  . amLODipine (NORVASC) 10 MG tablet Take 10 mg by mouth daily.    . ASPIRIN LOW DOSE 81 MG EC tablet TAKE 1 TABLET BY MOUTH EVERY DAY 90 tablet 2  . diclofenac Sodium (VOLTAREN) 1 % GEL as needed.    . etodolac (LODINE) 400 MG tablet Take 400 mg by mouth 2 (two) times daily as needed.    Marland Kitchen lisinopril-hydrochlorothiazide (PRINZIDE,ZESTORETIC) 20-12.5 MG tablet Take 1 tablet by mouth daily.    . Multiple Vitamins-Minerals (MULTIVITAMIN MEN 50+ PO) Take by mouth daily.    Marland Kitchen omeprazole (PRILOSEC OTC) 20 MG tablet Take 20 mg by mouth daily.    . tamsulosin (FLOMAX) 0.4 MG CAPS capsule Take 0.4 mg by mouth daily after supper.     No current  facility-administered medications on file prior to visit.    PAST MEDICAL HISTORY: Past Medical History:  Diagnosis Date  . Arthritis   . Claudication in peripheral vascular disease (HCC) 09/05/2018  . GERD (gastroesophageal reflux disease)   . Gout    L knee  . Hyperlipidemia   . Hypertension      PAST SURGICAL HISTORY: Past Surgical History:  Procedure Laterality Date  . ABDOMINAL AORTOGRAM W/LOWER EXTREMITY N/A 09/06/2018   Procedure: ABDOMINAL AORTOGRAM W/LOWER EXTREMITY;  Surgeon: Elder NegusPatwardhan, Manish J, MD;  Location: [facility];  Service: Cardiovascular;  Laterality: N/A;  . FACIAL COSMETIC SURGERY     due ot shattered bones   . PERIPHERAL VASCULAR BALLOON ANGIOPLASTY  09/06/2018   Procedure: PERIPHERAL VASCULAR BALLOON ANGIOPLASTY;  Surgeon: Elder NegusPatwardhan, Manish J, MD;  Location: [facility];  Service: Cardiovascular;;  LT SFA  . right hand fracture    . TOTAL KNEE ARTHROPLASTY Left 04/04/2013   Procedure: LEFT TOTAL KNEE ARTHROPLASTY;  Surgeon: Jacki Conesonald A Gioffre, MD;  Location: [facility];  Service: Orthopedics;  Laterality: Left;    FAMILY HISTORY: The patient's family history includes Cancer in his mother.   SOCIAL HISTORY:  The patient  reports that he has been smoking cigarettes. He has a 39.00 pack-year smoking history. He has never used smokeless tobacco. He reports current alcohol use of about 12.0 standard drinks of alcohol per week. He reports that he does not use drugs.  Review of Systems  Constitutional: Negative for decreased appetite, malaise/fatigue, weight gain and weight loss.  Eyes: Negative for visual disturbance.  Cardiovascular: Positive for claudication. Negative for chest pain, dyspnea on exertion, leg swelling, orthopnea, palpitations and syncope.  Respiratory: Negative for hemoptysis and wheezing.   Endocrine: Negative for cold intolerance and heat intolerance.  Hematologic/Lymphatic: Does not bruise/bleed easily.  Skin: Negative for nail changes.  Musculoskeletal: Positive for joint pain (left knee pain). Negative for muscle weakness and myalgias.  Gastrointestinal: Negative for abdominal pain, change in bowel habit, nausea and vomiting.  Neurological: Negative for difficulty with concentration, dizziness, focal weakness and headaches.   Psychiatric/Behavioral: Negative for altered mental status and suicidal ideas.  All other systems reviewed and are negative.   PHYSICAL EXAM: Vitals with BMI 01/05/2020 07/10/2019 01/04/2019  Height 5\' 9"  5\' 9"  5\' 9"   Weight 190 lbs 183 lbs 183 lbs 6 oz  BMI 28.05 27.01 27.07  Systolic 139 123 [id number]  Diastolic 86 86 82  Pulse 94 100 96    CONSTITUTIONAL: Well-developed and well-nourished. No acute distress.  SKIN: Skin is warm and dry. No rash noted. No cyanosis. No pallor. No jaundice HEAD: Normocephalic and atraumatic.  EYES: No scleral icterus MOUTH/THROAT: Moist oral membranes.  NECK: No JVD present. No thyromegaly noted. No carotid bruits  LYMPHATIC: No visible cervical adenopathy.  CHEST Normal respiratory effort. No intercostal retractions  LUNGS: Clear to auscultation bilaterally.  No stridor. No wheezes. No rales.  CARDIOVASCULAR: Regular rate and rhythm, S1-S2, no murmurs rubs or gallops appreciated. ABDOMINAL: No apparent ascites.  EXTREMITIES: No peripheral edema.  2+ bilateral femoral pulses, 1+ left popliteal pulse patient was wearing a knee brace, 2+ right popliteal pulse, 2+ bilateral posterior tibial pulses, 1+ left dorsalis pedis, 2+ left dorsalis pedis. HEMATOLOGIC: No significant bruising NEUROLOGIC: Oriented to person, place, and time. Nonfocal. Normal muscle tone.  PSYCHIATRIC: Normal mood and affect. Normal behavior. Cooperative  CARDIAC DATABASE: EKG: 01/05/2020: Normal sinus rhythm, 84 bpm, normal axis, without underlying ischemia or injury pattern.  Echocardiogram: 07/08/2018: LVEF 61%, moderate LVH, normal diastolic filling pattern, mildly dilated left atrium.  Stress Testing:  Exercise sestamibi stress test 07/11/2018: Patient exercised on Bruce protocol for 7:15 minutes and achieved 8.24 METS.  Achieved 88% of maximum predicted heart rate. Myocardial perfusion imaging is normal. Overall left ventricular systolic function was normal without regional wall  motion abnormalities. The left ventricular ejection fraction was 62%. This is a low risk study.  Heart Catheterization: None  Abdominal aortic runoff with peripheral vascular intervention: 09/06/2018: Patent renal arteries Normal distal abdominal aorta & bilateral EIA and Rt Internal iliac artery Lt Internal iliac artery 50% stenosis Rt SFA mid 30% stenosis Lt SFA mid 80% stenosis prox 40% stenosis without pullback gradient Patent Lt PTA. Occluded Lt ATA and peroneal artery. Peroneal artery reconstitutes with collaterals to have two vessel runoff at ankle level Successful PTA 5.0 X 40 mm Mustang & 6.0 X 80 mm In.Pact Admiral DCB in left mid SFA 0% residual stenosis  Vascular imaging: Lower Extremity Arterial Duplex06/19/2020: Diffuse heterogeneous plaque in both lower extremities.  Mildly abnormal spectral waveforms of the right ankle. Normal PVR waveforms of the right ankle. Mildly abnormal spectral waveforms of the left ankle.  Mildly abnormal PVR waveforms of the left ankle.  Mildly decreased right resting ABI 0.81. Moderately decreased left resting ABI 0.74.  Compared to 07/08/2018, left SFA stenosis not present, represents successful revascularization. No significant change in ABI.   Lower Extremity Arterial Duplex 12/25/2019:  No hemodynamically significant stenoses are identified in the right lower extremity arterial system.  Moderate velocity increase at the left mid popliteal artery suggests >50% stenosis.  Left SFA angioplasty site appears patent.  This exam reveals mildly decreased perfusion of the right lower extremity, noted at the dorsalis pedis artery level (ABI 0.88) and normal perfusion of the left lower extremity (ABI 1.00).  Compared to the study done on 12/23/2018, no significant change in the right ABI, left ABI was 0.74. Left SFA angioplasty site was previously patent. It is patent now as well however popliteal artery stenosis is new.  LABORATORY DATA: CBC Latest  Ref Rng & Units 08/29/2018 10/17/2017 03/17/2014  WBC 3.4 - 10.8 x10E3/uL 8.9 9.2 8.5  Hemoglobin 13.0 - 17.7 g/dL 94.8 54.6 27.0  Hematocrit 37.5 - 51.0 % 48.7 45.5 48.4  Platelets 150 - 450 x10E3/uL 303 308 -    CMP Latest Ref Rng & Units 08/29/2018 10/17/2017 03/17/2014  Glucose 65 - 99 mg/dL 350(K) 96 95  BUN 8 - 27 mg/dL 9 13 10   Creatinine 0.76 - 1.27 mg/dL 9.38 1.82  Sodium 134 - 144 mmol/L 142 138 140  Potassium 3.5 - 5.2 mmol/L 4.1 4.3 4.4  Chloride 96 - 106 mmol/L 103 106 106  CO2 20 - 29 mmol/L 23 22 21   Calcium 8.6 - 10.2 mg/dL 9.6 9.2 9.8  Total Protein 6.0 - 8.3 g/dL - - 7.0  Total Bilirubin 0.2 - 1.2 mg/dL - - 0.8  Alkaline Phos 39 - 117 U/L - - 101  AST 0 - 37 U/L - - 23  ALT 0 - 53 U/L - - 28    Lipid Panel  No results found for: CHOL, TRIG, HDL, CHOLHDL, VLDL, LDLCALC, LDLDIRECT, LABVLDL  No results found for: HGBA1C No components found for: NTPROBNP No results found for: TSH  Cardiac Panel (last 3 results) No results for input(s): CKTOTAL, CKMB, TROPONINIHS, RELINDX in the last 72 hours.  IMPRESSION:    ICD-10-CM   1. PAD (peripheral  artery disease) (HCC)  I73.9 EKG 12-Lead    Basic metabolic panel    cilostazol (PLETAL) 50 MG tablet    rosuvastatin (CRESTOR) 20 MG tablet  2. Mixed hyperlipidemia  E78.2 rosuvastatin (CRESTOR) 20 MG tablet  3. Claudication in peripheral vascular disease (HCC)  I73.9   4. Benign essential hypertension  I10   5. S/P percutaneous transluminal angioplasty (PTA)  Z98.62   6. Tobacco use disorder  F17.200   7. Alcohol abuse  F10.10      RECOMMENDATIONS: Hector [name] is a 64 y.o. male whose past medical history and cardiovascular risk factors include: Establish peripheral artery disease with prior intervention and claudication, mixed hyperlipidemia, hypertension, alcohol abuse, and tobacco use disorder.  Peripheral artery disease with prior intervention and claudication: Improving  Patient states that he has  claudication in bilateral lower extremity left worse than the right.  However, the pain is significantly better since the peripheral intervention.  The pain in the left lower extremity is worse in the infrapopliteal region.  However, left ABI has improved compared to prior study and now is back to normal.  But there is moderate stenosis in the left popliteal artery suggesting greater than 50% stenosis which is new compared to prior studies.  Continue aspirin.  Initiate cilostazol 50 mg p.o. twice daily  Increase rosuvastatin to 20 mg p.o. nightly  Patient is educated on continuing to walk on a daily basis as tolerable with the goal of 30 minutes a day 5 days a week  Educated on importance of complete smoking cessation.  Patient continues to smoke 1 pack/day  We discussed that the patient continues to have symptoms despite up titration of medical therapy may consider abdominal aortic runoff to reevaluate disease severity and as needed stage peripheral vascular PTA.  He agrees with plan discussed.  Active smoking: Currently smokes 1 pack/day.  Educated on importance of complete smoking cessation.  Mixed hyperlipidemia: Increase Crestor to 20 mg p.o. nightly.  Benign essential hypertension: Currently managed by primary care provider.   FINAL MEDICATION LIST END OF ENCOUNTER: Meds ordered this encounter  Medications  . cilostazol (PLETAL) 50 MG tablet    Sig: Take 1 tablet (50 mg total) by mouth 2 (two) times daily. Take it more than before or 2hours after food.    Dispense:  180 tablet    Refill:  0  . rosuvastatin (CRESTOR) 20 MG tablet    Sig: Take 1 tablet (20 mg total) by mouth at bedtime.    Dispense:  90 tablet    Refill:  0    Medications Discontinued During This Encounter  Medication Reason  . fexofenadine (ALLEGRA) 180 MG tablet Patient Preference  . clopidogrel (PLAVIX) 75 MG tablet Patient Preference  . rosuvastatin (CRESTOR) 10 MG tablet Dose change      Current Outpatient Medications:  .  amLODipine (NORVASC) 10 MG tablet, Take 10 mg by mouth daily., Disp: , Rfl:  .  ASPIRIN LOW DOSE 81 MG EC tablet, TAKE 1 TABLET BY MOUTH EVERY DAY, Disp: 90 tablet, Rfl: 2 .  diclofenac Sodium (VOLTAREN) 1 % GEL, as needed., Disp: , Rfl:  .  etodolac (LODINE) 400 MG tablet, Take 400 mg by mouth 2 (two) times daily as needed., Disp: , Rfl:  .  lisinopril-hydrochlorothiazide (PRINZIDE,ZESTORETIC) 20-12.5 MG tablet, Take 1 tablet by mouth daily., Disp: , Rfl:  .  Multiple Vitamins-Minerals (MULTIVITAMIN MEN 50+ PO), Take by mouth daily., Disp: , Rfl:  .  omeprazole (PRILOSEC  OTC) 20 MG tablet, Take 20 mg by mouth daily., Disp: , Rfl:  .  tamsulosin (FLOMAX) 0.4 MG CAPS capsule, Take 0.4 mg by mouth daily after supper., Disp: , Rfl:  .  cilostazol (PLETAL) 50 MG tablet, Take 1 tablet (50 mg total) by mouth 2 (two) times daily. Take it more than before or 2hours after food., Disp: 180 tablet, Rfl: 0 .  rosuvastatin (CRESTOR) 20 MG tablet, Take 1 tablet (20 mg total) by mouth at bedtime., Disp: 90 tablet, Rfl: 0  Orders Placed This Encounter  Procedures  . Basic metabolic panel  . EKG 12-Lead   --Continue cardiac medications as reconciled in final medication list. --Return in about 3 months (around 04/06/2020) for PAD follow up and recent medication addition. . Or sooner if needed. --Continue follow-up with your primary care physician regarding the management of your other chronic comorbid conditions.  Patient's questions and concerns were addressed to his satisfaction. He voices understanding of the instructions provided during this encounter.   This note was created using a voice recognition software as a result there may be grammatical errors inadvertently enclosed that do not reflect the nature of this encounter. Every attempt is made to correct such errors.  Tessa Lerner, [place], [facility]  Pager: [phone] Office: [phone]

## 2020-01-09 ENCOUNTER — Ambulatory Visit: Payer: 59 | Admitting: Cardiology

## 2020-01-10 LAB — BASIC METABOLIC PANEL
BUN/Creatinine Ratio: 13 (ref 10–24)
BUN: 14 mg/dL (ref 8–27)
CO2: 18 mmol/L — ABNORMAL LOW (ref 20–29)
Calcium: 9.7 mg/dL (ref 8.6–10.2)
Chloride: 102 mmol/L (ref 96–106)
Creatinine, Ser: 1.05 mg/dL (ref 0.76–1.27)
GFR calc Af Amer: 88 mL/min/{1.73_m2} (ref >59)
GFR calc non Af Amer: 76 mL/min/{1.73_m2} (ref >59)
Glucose: 120 mg/dL — ABNORMAL HIGH (ref 65–99)
Potassium: 4.2 mmol/L (ref 3.5–5.2)
Sodium: 140 mmol/L (ref 134–144)

## 2020-01-12 ENCOUNTER — Telehealth: Payer: Self-pay

## 2020-01-12 NOTE — Telephone Encounter (Signed)
-----   Message from [place], [place] sent at 01/11/2020  6:44 PM EDT ----- Results reviewed.Serum potassium  levels are within normal limits.Kidney function is relatively at baseline. Serum glucose elevated, please have him discuss this with his PCPContinue current medical therapy.Call if questions arise.

## 2020-01-12 NOTE — Telephone Encounter (Signed)
Discussed results with patient. He verbalized understanding. 

## 2020-03-28 ENCOUNTER — Other Ambulatory Visit: Payer: Self-pay | Admitting: Cardiology

## 2020-03-28 DIAGNOSIS — I739 Peripheral vascular disease, unspecified: Secondary | ICD-10-CM

## 2020-04-08 ENCOUNTER — Telehealth: Payer: Self-pay

## 2020-04-08 ENCOUNTER — Ambulatory Visit: Payer: 59 | Admitting: Cardiology

## 2020-04-08 NOTE — Telephone Encounter (Signed)
Pt called to say that he thinks the pletal is making him dizzy and tired

## 2020-04-12 NOTE — Telephone Encounter (Signed)
Dizziness may occur after starting Pletal for less than 10% of patients.  However, I doubt that the symptoms are occurring approximately 3 months after initiation of the medication.  It may be something else that is causing him to be tired or dizzy.  If the patient is unable to tolerate it he can stop the medication.  We can discuss it at the next appointment.  He had an appointment on 04/08/2020 which he was unable to make.  Please have make a follow-up appointment.

## 2020-04-23 NOTE — Telephone Encounter (Signed)
Called patient, Hector [name], LMAM

## 2020-04-23 NOTE — Telephone Encounter (Signed)
Called patient, NA, LMAM

## 2020-04-24 NOTE — Telephone Encounter (Signed)
Unable to reach patient. Left vm to cb.

## 2020-04-24 NOTE — Telephone Encounter (Signed)
From patient.

## 2020-04-30 ENCOUNTER — Encounter: Payer: Self-pay | Admitting: Cardiology

## 2020-04-30 ENCOUNTER — Other Ambulatory Visit: Payer: Self-pay

## 2020-04-30 ENCOUNTER — Ambulatory Visit: Payer: 59 | Admitting: Cardiology

## 2020-04-30 VITALS — BP 121/75 | HR 96 | Resp 16 | Ht 69.0 in | Wt 172.0 lb

## 2020-04-30 DIAGNOSIS — F17200 Nicotine dependence, unspecified, uncomplicated: Secondary | ICD-10-CM

## 2020-04-30 DIAGNOSIS — I10 Essential (primary) hypertension: Secondary | ICD-10-CM

## 2020-04-30 DIAGNOSIS — E782 Mixed hyperlipidemia: Secondary | ICD-10-CM

## 2020-04-30 DIAGNOSIS — I739 Peripheral vascular disease, unspecified: Secondary | ICD-10-CM

## 2020-04-30 DIAGNOSIS — Z9862 Peripheral vascular angioplasty status: Secondary | ICD-10-CM

## 2020-04-30 NOTE — Progress Notes (Signed)
Hector [name] Date of Birth: 09/01/1955 MRN: [id number] Primary Care Provider:Bland, Adrian SaranVeita, MD Former Cardiology Providers: Altamese CarolinaAshton Kelley, APRN, FNP-C, Dr. Yates DecampJay Ganji  Primary Cardiologist: Tessa LernerSunit Addaline Peplinski, DO, [facility] (established care 01/05/2020)  Date: 04/30/20 Last Office Visit: 01/05/2020  Chief Complaint  Patient presents with   PAD   Follow-up    3 month    HPI  Hector [name] is a 64 y.o.  male who presents to the office with a chief complaint of " reevaluation of peripheral artery disease." Patient's past medical history and cardiovascular risk factors include: Establish peripheral artery disease with prior intervention and claudication, mixed hyperlipidemia, hypertension, alcohol abuse, and tobacco use disorder.  Patient was formally under the care of Dr. Rudean HittJay Ganji/Ashton Kelley, APRN, FNP-C for evaluation and management of peripheral artery disease.  Patient had abdominal aortic runoff which noted peripheral artery disease in bilateral lower extremity.  Left SFA was noted to have 80% stenosis in the midsegment at 40% of the proximal segment without gradient.  He underwent successful PTA with 5 x 40 mm Mustang and 6 x 80 mm drug-coated balloon in the mid left SFA with 0% residual stenosis.  At the last office visit patient was complaining of claudication in the bilateral lower extremity left worse than the right.  This shared decision was to increase Crestor, initiate cilostazol, and educated on importance of complete smoking cessation.  Since last office visit patient continues to smoke despite tobacco cessation counseling, he continues to take his prior dose of Crestor as he forgot to uptitrate the dose, and he stopped taking cilostazol as it was making him dizzy and feeling tired.  Patient states that since he stopped cilostazol he is no longer dizzy and currently at his baseline energy level.  He does not have claudication with ambulation but has left knee pain.  Since last office  visit no hospitalizations or urgent care visits for cardiovascular symptoms.  Unfortunately patient continues to smoke 1 pack/day.  His overall functional status has been limited since he has been laid off.  History of PAD with prior intervention as noted below. Denies prior history of coronary artery disease, myocardial infarction, congestive heart failure, deep venous thrombosis, pulmonary embolism, stroke, transient ischemic attack.  FUNCTIONAL STATUS: No structured exercise program or daily routine. 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    ALLERGIES: No Known Allergies   MEDICATION LIST PRIOR TO VISIT: Current Outpatient Medications on File Prior to Visit  Medication Sig Dispense Refill   amLODipine (NORVASC) 10 MG tablet Take 10 mg by mouth daily.     ASPIRIN LOW DOSE 81 MG EC tablet TAKE 1 TABLET BY MOUTH EVERY DAY 90 tablet 2   diclofenac Sodium (VOLTAREN) 1 % GEL as needed.     etodolac (LODINE) 400 MG tablet Take 400 mg by mouth 2 (two) times daily as needed.     lisinopril-hydrochlorothiazide (PRINZIDE,ZESTORETIC) 20-12.5 MG tablet Take 1 tablet by mouth daily.     Multiple Vitamins-Minerals (MULTIVITAMIN MEN 50+ PO) Take by mouth daily.     omeprazole (PRILOSEC OTC) 20 MG tablet Take 20 mg by mouth daily.     rosuvastatin (CRESTOR) 20 MG tablet Take 1 tablet (20 mg total) by mouth at bedtime. 90 tablet 0   tamsulosin (FLOMAX) 0.4 MG CAPS capsule Take 0.4 mg by mouth daily after supper.     No current facility-administered medications on file prior to visit.   PAST MEDICAL  HISTORY: Past Medical History:  Diagnosis Date   Arthritis    Claudication in peripheral vascular disease (HCC) 09/05/2018   GERD (gastroesophageal reflux disease)    Gout    L knee   Hyperlipidemia    Hypertension     PAST SURGICAL HISTORY: Past Surgical History:  Procedure Laterality Date   ABDOMINAL AORTOGRAM W/LOWER EXTREMITY N/A  09/06/2018   Procedure: ABDOMINAL AORTOGRAM W/LOWER EXTREMITY;  Surgeon: Elder Negus, MD;  Location: [facility];  Service: Cardiovascular;  Laterality: N/A;   FACIAL COSMETIC SURGERY     due ot shattered bones    PERIPHERAL VASCULAR BALLOON ANGIOPLASTY  09/06/2018   Procedure: PERIPHERAL VASCULAR BALLOON ANGIOPLASTY;  Surgeon: Elder Negus, MD;  Location: [facility];  Service: Cardiovascular;;  LT SFA   right hand fracture     TOTAL KNEE ARTHROPLASTY Left 04/04/2013   Procedure: LEFT TOTAL KNEE ARTHROPLASTY;  Surgeon: Jacki Cones, MD;  Location: [facility];  Service: Orthopedics;  Laterality: Left;    FAMILY HISTORY: The patient's family history includes Cancer in his mother.   SOCIAL HISTORY:  The patient  reports that he has been smoking cigarettes. He has a 39.00 pack-year smoking history. He has never used smokeless tobacco. He reports current alcohol use of about 12.0 standard drinks of alcohol per week. He reports that he does not use drugs.  Review of Systems  Constitutional: Negative for decreased appetite, malaise/fatigue, weight gain and weight loss.  Eyes: Negative for visual disturbance.  Cardiovascular: Negative for chest pain, claudication, dyspnea on exertion, leg swelling, orthopnea, palpitations and syncope.  Respiratory: Negative for hemoptysis and wheezing.   Endocrine: Negative for cold intolerance and heat intolerance.  Hematologic/Lymphatic: Does not bruise/bleed easily.  Skin: Negative for nail changes.  Musculoskeletal: Positive for joint pain (left knee pain). Negative for muscle weakness and myalgias.  Gastrointestinal: Negative for abdominal pain, change in bowel habit, nausea and vomiting.  Neurological: Negative for difficulty with concentration, dizziness, focal weakness and headaches.  Psychiatric/Behavioral: Negative for altered mental status and suicidal ideas.  All other systems reviewed and are negative.   PHYSICAL  EXAM: Vitals with BMI 04/30/2020 01/05/2020 07/10/2019  Height 5\' 9"  5\' 9"  5\' 9"   Weight 172 lbs 190 lbs 183 lbs  BMI 25.39 28.05 27.01  Systolic 121 139  Diastolic 75 86 86  Pulse 96 94 100    CONSTITUTIONAL: Well-developed and well-nourished. No acute distress.  SKIN: Skin is warm and dry. No rash noted. No cyanosis. No pallor. No jaundice HEAD: Normocephalic and atraumatic.  EYES: No scleral icterus MOUTH/THROAT: Moist oral membranes.  NECK: No JVD present. No thyromegaly noted. No carotid bruits  LYMPHATIC: No visible cervical adenopathy.  CHEST Normal respiratory effort. No intercostal retractions  LUNGS: Clear to auscultation bilaterally.  No stridor. No wheezes. No rales.  CARDIOVASCULAR: Regular rate and rhythm, S1-S2, no murmurs rubs or gallops appreciated. ABDOMINAL: No apparent ascites.  EXTREMITIES: No peripheral edema.  2+ bilateral femoral pulses, 1+ left popliteal pulse patient was wearing a knee brace, 2+ right popliteal pulse, 2+ bilateral posterior tibial pulses, 1+ left dorsalis pedis, 2+ left dorsalis pedis. HEMATOLOGIC: No significant bruising NEUROLOGIC: Oriented to person, place, and time. Nonfocal. Normal muscle tone.  PSYCHIATRIC: Normal mood and affect. Normal behavior. Cooperative  CARDIAC DATABASE: EKG: 01/05/2020: Normal sinus rhythm, 84 bpm, normal axis, without underlying ischemia or injury pattern.  Echocardiogram: 07/08/2018: LVEF 61%, moderate LVH, normal diastolic filling pattern, mildly dilated left atrium.  Stress  Testing:  Exercise sestamibi stress test 07/11/2018: Patient exercised on Bruce protocol for 7:15 minutes and achieved 8.24 METS.  Achieved 88% of maximum predicted heart rate. Myocardial perfusion imaging is normal. Overall left ventricular systolic function was normal without regional wall motion abnormalities. The left ventricular ejection fraction was 62%. This is a low risk study.  Heart Catheterization: None  Abdominal aortic  runoff with peripheral vascular intervention: 09/06/2018: Patent renal arteries Normal distal abdominal aorta & bilateral EIA and Rt Internal iliac artery Lt Internal iliac artery 50% stenosis Rt SFA mid 30% stenosis Lt SFA mid 80% stenosis prox 40% stenosis without pullback gradient Patent Lt PTA. Occluded Lt ATA and peroneal artery. Peroneal artery reconstitutes with collaterals to have two vessel runoff at ankle level Successful PTA 5.0 X 40 mm Mustang & 6.0 X 80 mm In.Pact Admiral DCB in left mid SFA 0% residual stenosis  Vascular imaging: Lower Extremity Arterial Duplex06/19/2020: Diffuse heterogeneous plaque in both lower extremities.  Mildly abnormal spectral waveforms of the right ankle. Normal PVR waveforms of the right ankle. Mildly abnormal spectral waveforms of the left ankle.  Mildly abnormal PVR waveforms of the left ankle.  Mildly decreased right resting ABI 0.81. Moderately decreased left resting ABI 0.74.  Compared to 07/08/2018, left SFA stenosis not present, represents successful revascularization. No significant change in ABI.   Lower Extremity Arterial Duplex 12/25/2019:  No hemodynamically significant stenoses are identified in the right lower extremity arterial system.  Moderate velocity increase at the left mid popliteal artery suggests >50% stenosis.  Left SFA angioplasty site appears patent.  This exam reveals mildly decreased perfusion of the right lower extremity, noted at the dorsalis pedis artery level (ABI 0.88) and normal perfusion of the left lower extremity (ABI 1.00).  Compared to the study done on 12/23/2018, no significant change in the right ABI, left ABI was 0.74. Left SFA angioplasty site was previously patent. It is patent now as well however popliteal artery stenosis is new.  LABORATORY DATA: CBC Latest Ref Rng & Units 08/29/2018 10/17/2017 03/17/2014  WBC 3.4 - 10.8 x10E3/uL 8.9 9.2 8.5  Hemoglobin 13.0 - 17.7 g/dL 40.9 81.1 91.4  Hematocrit 37.5 -  51.0 % 48.7 45.5 48.4  Platelets 150 - 450 x10E3/uL 303 308 -    CMP Latest Ref Rng & Units 01/09/2020 08/29/2018 10/17/2017  Glucose 65 - 99 mg/dL 782(N) 562(Z) 96  BUN 8 - 27 mg/dL Creatinine 0.76 - 1.27 mg/dL 3.08 6.57 8.46  Sodium 134 - 144 mmol/L 140 142 138  Potassium 3.5 - 5.2 mmol/L 4.2 4.1 4.3  Chloride 96 - 106 mmol/L 102 103 106  CO2 20 - 29 mmol/L 18(L) 23 22  Calcium 8.6 - 10.2 mg/dL 9.7 9.6 9.2  Total Protein 6.0 - 8.3 g/dL - - -  Total Bilirubin 0.2 - 1.2 mg/dL - - -  Alkaline Phos 39 - 117 U/L - - -  AST 0 - 37 U/L - - -  ALT 0 - 53 U/L - - -    Lipid Panel  No results found for: CHOL, TRIG, HDL, CHOLHDL, VLDL, LDLCALC, LDLDIRECT, LABVLDL  No results found for: HGBA1C No components found for: NTPROBNP No results found for: TSH  Cardiac Panel (last 3 results) No results for input(s): CKTOTAL, CKMB, TROPONINIHS, RELINDX in the last 72 hours.  IMPRESSION:    ICD-10-CM   1. PAD (peripheral artery disease) (HCC)  I73.9   2. S/P percutaneous transluminal angioplasty (PTA)  N62.95  3. Mixed hyperlipidemia  E78.2   4. Benign essential hypertension  I10   5. Tobacco use disorder  F17.200      RECOMMENDATIONS: Hector [name] is a 64 y.o. male whose past medical history and cardiovascular risk factors include: Establish peripheral artery disease with prior intervention and claudication, mixed hyperlipidemia, hypertension, alcohol abuse, and tobacco use disorder.  Peripheral artery disease with prior intervention without claudication: Stable  Patient did not tolerate initiation of cilostazol as it caused him to be dizzy and feeling tired.  He is now back to baseline after stopping cilostazol.  Since last visit patient states that he no longer experiences claudication but it is more left knee pain.  He was asked to discuss it further with his PCP.   Continue aspirin.  Increase rosuvastatin to 20 mg p.o. nightly  Educated on the importance of complete  smoking cessation.  Patient is educated on continuing to walk on a daily basis as tolerable with the goal of 30 minutes a day 5 days a week  Active smoking: Currently smokes 1 pack/day.  Educated on importance of complete smoking cessation.  Patient has lost weight since last office visit which is predominantly unintentional.  He is attributing it to the amount of stress he is undergoing.  However, given his extensive history of smoking I have asked him to discuss with department with his PCP as he may benefit from being screened from lung cancer and age-appropriate cancer screening if not already performed.  Will defer to PCP at this time he verbalized understanding and states that he will follow-up with his PCP.  Mixed hyperlipidemia: Increase Crestor to 20 mg p.o. nightly.  Benign essential hypertension: Office blood pressure is very well controlled.  Medications reconciled.  Currently managed by primary care provider.   FINAL MEDICATION LIST END OF ENCOUNTER: No orders of the defined types were placed in this encounter.   Medications Discontinued During This Encounter  Medication Reason   cilostazol (PLETAL) 50 MG tablet Patient Preference     Current Outpatient Medications:    amLODipine (NORVASC) 10 MG tablet, Take 10 mg by mouth daily., Disp: , Rfl:    ASPIRIN LOW DOSE 81 MG EC tablet, TAKE 1 TABLET BY MOUTH EVERY DAY, Disp: 90 tablet, Rfl: 2   diclofenac Sodium (VOLTAREN) 1 % GEL, as needed., Disp: , Rfl:    etodolac (LODINE) 400 MG tablet, Take 400 mg by mouth 2 (two) times daily as needed., Disp: , Rfl:    lisinopril-hydrochlorothiazide (PRINZIDE,ZESTORETIC) 20-12.5 MG tablet, Take 1 tablet by mouth daily., Disp: , Rfl:    Multiple Vitamins-Minerals (MULTIVITAMIN MEN 50+ PO), Take by mouth daily., Disp: , Rfl:    omeprazole (PRILOSEC OTC) 20 MG tablet, Take 20 mg by mouth daily., Disp: , Rfl:    rosuvastatin (CRESTOR) 20 MG tablet, Take 1 tablet (20 mg total) by mouth at  bedtime., Disp: 90 tablet, Rfl: 0   tamsulosin (FLOMAX) 0.4 MG CAPS capsule, Take 0.4 mg by mouth daily after supper., Disp: , Rfl:   No orders of the defined types were placed in this encounter.  --Continue cardiac medications as reconciled in final medication list. --Return in about 6 months (around 10/29/2020) for Reevaluation of PAD. Or sooner if needed. --Continue follow-up with your primary care physician regarding the management of your other chronic comorbid conditions.  Patient's questions and concerns were addressed to his satisfaction. He voices understanding of the instructions provided during this encounter.   This note was created  using a voice recognition software as a result there may be grammatical errors inadvertently enclosed that do not reflect the nature of this encounter. Every attempt is made to correct such errors.  Total time spent: 20 minutes.   Tessa Lerner, [place], [facility]  Pager: [phone] Office: [phone]

## 2020-05-23 ENCOUNTER — Other Ambulatory Visit: Payer: Self-pay | Admitting: Cardiology

## 2020-06-03 ENCOUNTER — Other Ambulatory Visit: Payer: Self-pay | Admitting: Family Medicine

## 2020-06-20 ENCOUNTER — Other Ambulatory Visit: Payer: Self-pay | Admitting: Cardiology

## 2020-06-20 DIAGNOSIS — I739 Peripheral vascular disease, unspecified: Secondary | ICD-10-CM

## 2020-08-14 ENCOUNTER — Other Ambulatory Visit: Payer: Self-pay | Admitting: Cardiology

## 2020-08-14 DIAGNOSIS — I739 Peripheral vascular disease, unspecified: Secondary | ICD-10-CM

## 2020-10-29 ENCOUNTER — Ambulatory Visit: Payer: 59 | Admitting: Cardiology

## 2021-10-30 ENCOUNTER — Encounter (HOSPITAL_COMMUNITY): Payer: Self-pay | Admitting: Emergency Medicine

## 2021-10-30 ENCOUNTER — Other Ambulatory Visit: Payer: Self-pay

## 2021-10-30 ENCOUNTER — Emergency Department (HOSPITAL_COMMUNITY)
Admission: EM | Admit: 2021-10-30 | Discharge: 2021-10-30 | Disposition: A | Discharge status: Home / Self Care | Payer: No Typology Code available for payment source | Attending: Emergency Medicine | Admitting: Emergency Medicine

## 2021-10-30 ENCOUNTER — Emergency Department (HOSPITAL_COMMUNITY): Payer: No Typology Code available for payment source

## 2021-10-30 ENCOUNTER — Encounter (HOSPITAL_COMMUNITY): Payer: Self-pay

## 2021-10-30 ENCOUNTER — Ambulatory Visit (HOSPITAL_COMMUNITY)
Admission: EM | Admit: 2021-10-30 | Discharge: 2021-10-30 | Disposition: A | Discharge status: Home / Self Care | Payer: BLUE CROSS/BLUE SHIELD

## 2021-10-30 DIAGNOSIS — Z7982 Long term (current) use of aspirin: Secondary | ICD-10-CM | POA: Diagnosis not present

## 2021-10-30 DIAGNOSIS — R197 Diarrhea, unspecified: Secondary | ICD-10-CM | POA: Diagnosis not present

## 2021-10-30 DIAGNOSIS — R42 Dizziness and giddiness: Secondary | ICD-10-CM | POA: Diagnosis present

## 2021-10-30 DIAGNOSIS — R112 Nausea with vomiting, unspecified: Secondary | ICD-10-CM | POA: Insufficient documentation

## 2021-10-30 DIAGNOSIS — Z79899 Other long term (current) drug therapy: Secondary | ICD-10-CM | POA: Diagnosis not present

## 2021-10-30 DIAGNOSIS — Z20822 Contact with and (suspected) exposure to covid-19: Secondary | ICD-10-CM | POA: Diagnosis not present

## 2021-10-30 DIAGNOSIS — I10 Essential (primary) hypertension: Secondary | ICD-10-CM | POA: Diagnosis not present

## 2021-10-30 DIAGNOSIS — E860 Dehydration: Secondary | ICD-10-CM | POA: Diagnosis not present

## 2021-10-30 LAB — COMPREHENSIVE METABOLIC PANEL
ALT: 27 U/L (ref 0–44)
AST: 33 U/L (ref 15–41)
Albumin: 4 g/dL (ref 3.5–5.0)
Alkaline Phosphatase: 76 U/L (ref 38–126)
Anion gap: 11 (ref 5–15)
BUN: 22 mg/dL (ref 8–23)
CO2: 18 mmol/L — ABNORMAL LOW (ref 22–32)
Calcium: 8.5 mg/dL — ABNORMAL LOW (ref 8.9–10.3)
Chloride: 107 mmol/L (ref 98–111)
Creatinine, Ser: 1.7 mg/dL — ABNORMAL HIGH (ref 0.61–1.24)
GFR, Estimated: 45 mL/min — ABNORMAL LOW (ref >60)
Glucose, Bld: 107 mg/dL — ABNORMAL HIGH (ref 70–99)
Potassium: 3.6 mmol/L (ref 3.5–5.1)
Sodium: 136 mmol/L (ref 135–145)
Total Bilirubin: 0.9 mg/dL (ref 0.3–1.2)
Total Protein: 6.7 g/dL (ref 6.5–8.1)

## 2021-10-30 LAB — CBC WITH DIFFERENTIAL/PLATELET
Abs Immature Granulocytes: 0.01 10*3/uL (ref 0.00–0.07)
Basophils Absolute: 0 10*3/uL (ref 0.0–0.1)
Basophils Relative: 1 %
Eosinophils Absolute: 0.3 10*3/uL (ref 0.0–0.5)
Eosinophils Relative: 6 %
HCT: 43.8 % (ref 39.0–52.0)
Hemoglobin: 13.6 g/dL (ref 13.0–17.0)
Immature Granulocytes: 0 %
Lymphocytes Relative: 35 %
Lymphs Abs: 1.6 10*3/uL (ref 0.7–4.0)
MCH: 22.3 pg — ABNORMAL LOW (ref 26.0–34.0)
MCHC: 31.1 g/dL (ref 30.0–36.0)
MCV: 71.8 fL — ABNORMAL LOW (ref 80.0–100.0)
Monocytes Absolute: 0.8 10*3/uL (ref 0.1–1.0)
Monocytes Relative: 17 %
Neutro Abs: 1.9 10*3/uL (ref 1.7–7.7)
Neutrophils Relative %: 41 %
Platelets: 310 10*3/uL (ref 150–400)
RBC: 6.1 MIL/uL — ABNORMAL HIGH (ref 4.22–5.81)
RDW: 17.4 % — ABNORMAL HIGH (ref 11.5–15.5)
WBC: 4.7 10*3/uL (ref 4.0–10.5)
nRBC: 0 % (ref 0.0–0.2)

## 2021-10-30 LAB — RESP PANEL BY RT-PCR (RSV, FLU A&B, COVID)  RVPGX2
Influenza A by PCR: NEGATIVE
Influenza B by PCR: NEGATIVE
Resp Syncytial Virus by PCR: NEGATIVE
SARS Coronavirus 2 by RT PCR: NEGATIVE

## 2021-10-30 LAB — LIPASE, BLOOD: Lipase: 52 U/L — ABNORMAL HIGH (ref 11–51)

## 2021-10-30 LAB — TROPONIN I (HIGH SENSITIVITY)
Troponin I (High Sensitivity): 8 ng/L (ref <18)
Troponin I (High Sensitivity): 6 ng/L (ref <18)

## 2021-10-30 LAB — MAGNESIUM: Magnesium: 2 mg/dL (ref 1.7–2.4)

## 2021-10-30 LAB — D-DIMER, QUANTITATIVE: D-Dimer, Quant: 0.59 ug/mL-FEU — ABNORMAL HIGH (ref 0.00–0.50)

## 2021-10-30 MED ORDER — SODIUM CHLORIDE 0.9 % IV BOLUS
1000.0000 mL | Freq: Once | INTRAVENOUS | Status: AC
  Administered 2021-10-30: 1000 mL via INTRAVENOUS

## 2021-10-30 MED ORDER — ONDANSETRON 4 MG PO TBDP: 4.0000 mg | ORAL_TABLET | Freq: Three times a day (TID) | ORAL | 0 refills | Status: DC | PRN

## 2021-10-30 NOTE — ED Notes (Signed)
Pt transported to ED via [facility].  ?

## 2021-10-30 NOTE — ED Provider Notes (Signed)
?[facility] EMERGENCY DEPARTMENT ?Provider Note ? ? ?CSN: [id number] ?Arrival date & time: 10/30/21  0947 ? ?  ? ?History ? ?Chief Complaint  ?Patient presents with  ? Nausea  ? Emesis  ? Dizziness  ? ? ?FERMON URETA is a 66 y.o. male with history of hypertension who presents to the emergency department today with a 2-day history of nausea, vomiting, diarrhea and lightheadedness.  Patient initially presented to urgent care this morning and upon being triaged patient had a blood pressure of 66/56.  Repeat was 80/49.  Patient was sent here for further evaluation.  Patient denies any fever, chills, abdominal pain, chest pain, or shortness of breath.  He does endorse a cough over the last couple of days.  Patient has been able to take his blood pressure medications.  He denies any sick contacts.  Patient does not drink alcohol on a regular basis.  Patient does smoke tobacco. ? ? ?Emesis ?Dizziness ?Associated symptoms: vomiting   ? ?  ? ?Home Medications ?Prior to Admission medications   ?Medication Sig Start Date End Date Taking? Authorizing Provider  ?amLODipine (NORVASC) 10 MG tablet Take 10 mg by mouth daily.    [provider]  ?ASPIRIN LOW DOSE 81 MG EC tablet TAKE 1 TABLET BY MOUTH EVERY DAY 05/23/20   Patwardhan, Manish J, MD  ?diclofenac Sodium (VOLTAREN) 1 % GEL as needed. 12/23/19   [provider]  ?etodolac (LODINE) 400 MG tablet Take 400 mg by mouth 2 (two) times daily as needed. 12/23/19   [provider]  ?lisinopril-hydrochlorothiazide (PRINZIDE,ZESTORETIC) 20-12.5 MG tablet Take 1 tablet by mouth daily.    [provider]  ?Multiple Vitamins-Minerals (MULTIVITAMIN MEN 50+ PO) Take by mouth daily.    [provider]  ?omeprazole (PRILOSEC OTC) 20 MG tablet Take 20 mg by mouth daily.    [provider]  ?rosuvastatin (CRESTOR) 20 MG tablet Take 1 tablet (20 mg total) by mouth at bedtime. 01/05/20 04/30/20  Tolia, Sunit, DO  ?tamsulosin  (FLOMAX) 0.4 MG CAPS capsule Take 0.4 mg by mouth daily after supper.    [provider]  ?   ? ?Allergies    ?Patient has no known allergies.   ? ?Review of Systems   ?Review of Systems  ?Gastrointestinal:  Positive for vomiting.  ?Neurological:  Positive for dizziness.  ?All other systems reviewed and are negative. ? ?Physical Exam ?Updated Vital Signs ?BP 112/77   Pulse 85   Temp 98.7 ?F (37.1 ?C)   Resp (!) 25   SpO2 94%  ?Physical Exam ?Vitals and nursing note reviewed.  ?Constitutional:   ?   General: He is not in acute distress. ?   Appearance: Normal appearance.  ?HENT:  ?   Head: Normocephalic and atraumatic.  ?Eyes:  ?   General:     ?   Right eye: No discharge.     ?   Left eye: No discharge.  ?Cardiovascular:  ?   Comments: Regular rate and rhythm.  S1/S2 are distinct without any evidence of murmur, rubs, or gallops.  Radial pulses are 2+ bilaterally.  Dorsalis pedis pulses are 2+ bilaterally.  No evidence of pedal edema. ?Pulmonary:  ?   Comments: Clear to auscultation bilaterally.  Normal effort.  No respiratory distress.  No evidence of wheezes, rales, or rhonchi heard throughout. ?Abdominal:  ?   General: Abdomen is flat. Bowel sounds are normal. There is no distension.  ?   Tenderness: There  is no abdominal tenderness. There is no guarding or rebound.  ?Musculoskeletal:     ?   General: Normal range of motion.  ?   Cervical back: Neck supple.  ?Skin: ?   General: Skin is warm and dry.  ?   Findings: No rash.  ?   Comments: There is evidence of mild skin tenting  ?Neurological:  ?   General: No focal deficit present.  ?   Mental Status: He is alert.  ?Psychiatric:     ?   Mood and Affect: Mood normal.     ?   Behavior: Behavior normal.  ? ? ?ED Results / Procedures / Treatments   ?Labs ?(all labs ordered are listed, but only abnormal results are displayed) ?Labs Reviewed  ?CBC WITH DIFFERENTIAL/PLATELET - Abnormal; Notable for the following components:  ?    Result Value  ? RBC 6.10 (*)    ? MCV 71.8 (*)   ? MCH 22.3 (*)   ? RDW 17.4 (*)   ? All other components within normal limits  ?COMPREHENSIVE METABOLIC PANEL - Abnormal; Notable for the following components:  ? CO2 18 (*)   ? Glucose, Bld 107 (*)   ? Creatinine, Ser 1.70 (*)   ? Calcium 8.5 (*)   ? GFR, Estimated 45 (*)   ? All other components within normal limits  ?LIPASE, BLOOD - Abnormal; Notable for the following components:  ? Lipase 52 (*)   ? All other components within normal limits  ?D-DIMER, QUANTITATIVE - Abnormal; Notable for the following components:  ? D-Dimer, Quant 0.59 (*)   ? All other components within normal limits  ?RESP PANEL BY RT-PCR (RSV, FLU A&B, COVID)  RVPGX2  ?RESP PANEL BY RT-PCR (FLU A&B, COVID) ARPGX2  ?MAGNESIUM  ?TROPONIN I (HIGH SENSITIVITY)  ?TROPONIN I (HIGH SENSITIVITY)  ? ? ?EKG ?EKG Interpretation ? ?Date/Time:  Thursday October 30 2021 09:55:17 EDT ?Ventricular Rate:  85 ?PR Interval:  137 ?QRS Duration: 98 ?QT Interval:  356 ?QTC Calculation: 424 ?R Axis:   101 ?Text Interpretation: Sinus rhythm Right axis deviation Borderline T abnormalities, inferior leads new T wave? inversions inferior from prior 4/19 Confirmed by Meridee ScoreButler, Michael [phone]) on 10/30/2021 9:59:30 AM ? ?Radiology ?DG Chest 2 View ? ?Result Date: 10/30/2021 ?CLINICAL DATA:  cough and hypoxia EXAM: CHEST - 2 VIEW COMPARISON:  Chest x-ray October 17, 2017. FINDINGS: No consolidation. No visible pleural effusions or pneumothorax. Cardiomediastinal silhouette is similar to prior. No displaced fracture. IMPRESSION: No evidence of acute cardiopulmonary disease. Electronically Signed   By: Feliberto HartsFrederick S Jones M.D.   On: 10/30/2021 12:34   ? ?Procedures ?Procedures  ? ? ?Medications Ordered in ED ?Medications  ?sodium chloride 0.9 % bolus 1,000 mL (0 mLs Intravenous Stopped 10/30/21 1154)  ? ? ?ED Course/ Medical Decision Making/ A&P ?Clinical Course as of 10/30/21 1436  ?Thu Oct 30, 2021  ?1240 Patient currently on 4 L of oxygen.  When oxygen is turned  off patient quickly desats to 88- 89% on room air.  Patient is a long-term smoker however does not use oxygen at baseline.  He is not complaining of shortness of breath at this time. He does not have a diagnosis of COPD. [CF]  ?1432 Patient ambulated independently and without difficulty.  He had normal oxygenation saturations during ambulation in the mid to high 90s.  Patient was at 93% on room air when he laid back down. [CF]  ?  ?Clinical Course User Index ?[CF] Meredeth IdeFleming,  Arrie Eastern, PA-C  ? ?                        ?Medical Decision Making ?Amount and/or Complexity of Data Reviewed ?Labs: ordered. ?Radiology: ordered. ? ? ?This patient presents to the ED for concern of nausea, vomiting, diarrhea, and lightheadedness this involves an extensive number of treatment options, and is a complaint that carries with it a high risk of complications and morbidity.  The differential diagnosis includes ACS given his newfound hypoxia, viral gastroenteritis, pancreatitis. ? ? ?Co morbidities that complicate the patient evaluation ? ?Past Medical History:  ?Diagnosis Date  ? Arthritis   ? Claudication in peripheral vascular disease (HCC) 09/05/2018  ? GERD (gastroesophageal reflux disease)   ? Gout   ? L knee  ? Hyperlipidemia   ? Hypertension   ? ? ?Additional history obtained: ? ?Additional history obtained from nursing note ?External records from outside source obtained and reviewed including old records from urgent care visit just prior to arrival. ? ? ?Lab Tests: ? ?I Ordered, and personally interpreted labs.  The pertinent results include: CBC shows no evidence of leukocytosis.  There is erythrocytosis likely secondary to his chronic smoking history.  Lipase is just above the upper normal limit.  CMP shows evidence of elevated creatinine in the setting of hypovolemia.  Given him a liter of fluid in the department.  Magnesium normal.  Initial and delta troponin are negative.  D-dimer is slightly elevated but within the  age-adjusted range.  COVID and flu were collected and pending. ? ? ?Imaging Studies ordered: ? ?I ordered imaging studies including chest x-ray ?I independently visualized and interpreted imaging which showed no e

## 2021-10-30 NOTE — ED Triage Notes (Signed)
Pt to ER vi a EMS from urget care where he presented for n/v and dizziness since Tuesday.  Pt was noted to be hypotensive at UC and IV was started with saline bolus.  500cc completed, additional 500cc infusing at this time. ?

## 2021-10-30 NOTE — ED Notes (Signed)
[facility] called and made aware of transport ? ?

## 2021-10-30 NOTE — ED Notes (Signed)
Patient is being discharged from the Urgent Care and sent to the Emergency Department via [facility] . Per PA, patient is in need of higher level of care due to Hypotensive. Patient is aware and verbalizes understanding of plan of care.  ?Vitals:  ? 10/30/21 0920  ?BP: (!) 80/49  ?Pulse: 99  ?Resp: 18  ?Temp: (!) 97.2 ?F (36.2 ?C)  ?SpO2: 94%  ?  ?

## 2021-10-30 NOTE — ED Notes (Signed)
Judson Roch Charge RN at [facility] ED made aware of patient being transferred.  ?

## 2021-10-30 NOTE — ED Triage Notes (Signed)
Pt c/o dizziness, headache, and n/v since Tuesday. Pt pale on arrival. B/p 80/49. PA at bedside, IV fluids started. Pt a/o x4. No distress noted at this time.  ?

## 2021-10-30 NOTE — ED Notes (Signed)
Pt ambulatory independently to BR. Pt denies SHOB, gait difficulty or dizziness.  RA O2 93% after ambulation. ?

## 2021-10-30 NOTE — ED Provider Notes (Signed)
I was called into triage to evaluate patient by nursing staff.  Reports for the past 2 to 3 days he has had nausea, vomiting, diarrhea.  Denies any hematemesis, melena, hematochezia, abdominal pain, chest pain, shortness of breath.  He is experiencing headaches and lightheadedness.  Initial triage vitals included blood pressure of 66/56.  Repeat was 80/49.  Discussed need for emergent evaluation including stat labs and monitoring and patient is agreeable to go to hospital by [facility].  IV fluids started in triage. ?  ?Jeani Hawking, PA-C ?10/30/21 1093 ? ?

## 2021-10-30 NOTE — Discharge Instructions (Signed)
Please drink plenty of fluids and get plenty of rest.  I will send you a prescription of Zofran to help with the vomiting.  Please take as prescribed.  I would like for you to follow-up at the [facility] or with [facility] health community health and wellness for further evaluation.  Please return to the emergency department for any worsening symptoms you might have. ?

## 2021-12-04 ENCOUNTER — Emergency Department (HOSPITAL_COMMUNITY): Payer: No Typology Code available for payment source

## 2021-12-04 ENCOUNTER — Encounter (HOSPITAL_COMMUNITY): Payer: Self-pay

## 2021-12-04 ENCOUNTER — Inpatient Hospital Stay (HOSPITAL_COMMUNITY)
Admission: EM | Admit: 2021-12-04 | Discharge: 2021-12-15 | DRG: 189 | Disposition: A | Discharge status: Home / Self Care | Payer: No Typology Code available for payment source | Attending: Internal Medicine | Admitting: Internal Medicine

## 2021-12-04 ENCOUNTER — Other Ambulatory Visit: Payer: Self-pay

## 2021-12-04 DIAGNOSIS — M25512 Pain in left shoulder: Secondary | ICD-10-CM | POA: Diagnosis present

## 2021-12-04 DIAGNOSIS — M79671 Pain in right foot: Secondary | ICD-10-CM | POA: Diagnosis present

## 2021-12-04 DIAGNOSIS — R590 Localized enlarged lymph nodes: Secondary | ICD-10-CM | POA: Diagnosis present

## 2021-12-04 DIAGNOSIS — I110 Hypertensive heart disease with heart failure: Secondary | ICD-10-CM | POA: Diagnosis present

## 2021-12-04 DIAGNOSIS — R0609 Other forms of dyspnea: Secondary | ICD-10-CM | POA: Diagnosis not present

## 2021-12-04 DIAGNOSIS — M25562 Pain in left knee: Secondary | ICD-10-CM | POA: Diagnosis present

## 2021-12-04 DIAGNOSIS — I739 Peripheral vascular disease, unspecified: Secondary | ICD-10-CM | POA: Diagnosis present

## 2021-12-04 DIAGNOSIS — K219 Gastro-esophageal reflux disease without esophagitis: Secondary | ICD-10-CM | POA: Diagnosis present

## 2021-12-04 DIAGNOSIS — M79672 Pain in left foot: Secondary | ICD-10-CM | POA: Diagnosis present

## 2021-12-04 DIAGNOSIS — Q2112 Patent foramen ovale: Secondary | ICD-10-CM | POA: Diagnosis not present

## 2021-12-04 DIAGNOSIS — I10 Essential (primary) hypertension: Secondary | ICD-10-CM

## 2021-12-04 DIAGNOSIS — E782 Mixed hyperlipidemia: Secondary | ICD-10-CM | POA: Diagnosis present

## 2021-12-04 DIAGNOSIS — D72829 Elevated white blood cell count, unspecified: Secondary | ICD-10-CM | POA: Diagnosis present

## 2021-12-04 DIAGNOSIS — G8929 Other chronic pain: Secondary | ICD-10-CM | POA: Diagnosis present

## 2021-12-04 DIAGNOSIS — Z96652 Presence of left artificial knee joint: Secondary | ICD-10-CM | POA: Diagnosis present

## 2021-12-04 DIAGNOSIS — M109 Gout, unspecified: Secondary | ICD-10-CM | POA: Diagnosis present

## 2021-12-04 DIAGNOSIS — M25561 Pain in right knee: Secondary | ICD-10-CM | POA: Diagnosis present

## 2021-12-04 DIAGNOSIS — J9811 Atelectasis: Secondary | ICD-10-CM | POA: Diagnosis present

## 2021-12-04 DIAGNOSIS — Z20822 Contact with and (suspected) exposure to covid-19: Secondary | ICD-10-CM | POA: Diagnosis present

## 2021-12-04 DIAGNOSIS — M549 Dorsalgia, unspecified: Secondary | ICD-10-CM | POA: Diagnosis present

## 2021-12-04 DIAGNOSIS — F17210 Nicotine dependence, cigarettes, uncomplicated: Secondary | ICD-10-CM | POA: Diagnosis present

## 2021-12-04 DIAGNOSIS — I5031 Acute diastolic (congestive) heart failure: Secondary | ICD-10-CM

## 2021-12-04 DIAGNOSIS — M160 Bilateral primary osteoarthritis of hip: Secondary | ICD-10-CM | POA: Diagnosis present

## 2021-12-04 DIAGNOSIS — R599 Enlarged lymph nodes, unspecified: Secondary | ICD-10-CM | POA: Diagnosis not present

## 2021-12-04 DIAGNOSIS — J9601 Acute respiratory failure with hypoxia: Principal | ICD-10-CM | POA: Diagnosis present

## 2021-12-04 DIAGNOSIS — F17200 Nicotine dependence, unspecified, uncomplicated: Secondary | ICD-10-CM | POA: Diagnosis not present

## 2021-12-04 DIAGNOSIS — J9691 Respiratory failure, unspecified with hypoxia: Secondary | ICD-10-CM | POA: Diagnosis present

## 2021-12-04 DIAGNOSIS — I4891 Unspecified atrial fibrillation: Secondary | ICD-10-CM

## 2021-12-04 DIAGNOSIS — R0902 Hypoxemia: Principal | ICD-10-CM

## 2021-12-04 DIAGNOSIS — R0602 Shortness of breath: Secondary | ICD-10-CM | POA: Diagnosis present

## 2021-12-04 DIAGNOSIS — Z7902 Long term (current) use of antithrombotics/antiplatelets: Secondary | ICD-10-CM

## 2021-12-04 DIAGNOSIS — I5033 Acute on chronic diastolic (congestive) heart failure: Secondary | ICD-10-CM | POA: Diagnosis present

## 2021-12-04 DIAGNOSIS — D509 Iron deficiency anemia, unspecified: Secondary | ICD-10-CM | POA: Diagnosis present

## 2021-12-04 DIAGNOSIS — Z79899 Other long term (current) drug therapy: Secondary | ICD-10-CM

## 2021-12-04 DIAGNOSIS — I480 Paroxysmal atrial fibrillation: Secondary | ICD-10-CM | POA: Diagnosis present

## 2021-12-04 DIAGNOSIS — Z7982 Long term (current) use of aspirin: Secondary | ICD-10-CM

## 2021-12-04 DIAGNOSIS — R946 Abnormal results of thyroid function studies: Secondary | ICD-10-CM | POA: Diagnosis present

## 2021-12-04 DIAGNOSIS — M25511 Pain in right shoulder: Secondary | ICD-10-CM | POA: Diagnosis present

## 2021-12-04 LAB — I-STAT VENOUS BLOOD GAS, ED
Acid-Base Excess: 0 mmol/L (ref 0.0–2.0)
Bicarbonate: 23.8 mmol/L (ref 20.0–28.0)
Calcium, Ion: 1.19 mmol/L (ref 1.15–1.40)
HCT: 37 % — ABNORMAL LOW (ref 39.0–52.0)
Hemoglobin: 12.6 g/dL — ABNORMAL LOW (ref 13.0–17.0)
O2 Saturation: 45 %
Potassium: 3.4 mmol/L — ABNORMAL LOW (ref 3.5–5.1)
Sodium: 140 mmol/L (ref 135–145)
TCO2: 25 mmol/L (ref 22–32)
pCO2, Ven: 34.1 mmHg — ABNORMAL LOW (ref 44–60)
pH, Ven: 7.452 — ABNORMAL HIGH (ref 7.25–7.43)
pO2, Ven: 24 mmHg — CL (ref 32–45)

## 2021-12-04 LAB — CBC WITH DIFFERENTIAL/PLATELET
Abs Immature Granulocytes: 0 10*3/uL (ref 0.00–0.07)
Basophils Absolute: 0.1 10*3/uL (ref 0.0–0.1)
Basophils Relative: 1 %
Eosinophils Absolute: 0.3 10*3/uL (ref 0.0–0.5)
Eosinophils Relative: 3 %
HCT: 35.9 % — ABNORMAL LOW (ref 39.0–52.0)
Hemoglobin: 10.7 g/dL — ABNORMAL LOW (ref 13.0–17.0)
Lymphocytes Relative: 11 %
Lymphs Abs: 1.3 10*3/uL (ref 0.7–4.0)
MCH: 20.6 pg — ABNORMAL LOW (ref 26.0–34.0)
MCHC: 29.8 g/dL — ABNORMAL LOW (ref 30.0–36.0)
MCV: 69 fL — ABNORMAL LOW (ref 80.0–100.0)
Monocytes Absolute: 0.3 10*3/uL (ref 0.1–1.0)
Monocytes Relative: 3 %
Neutro Abs: 9.3 10*3/uL — ABNORMAL HIGH (ref 1.7–7.7)
Neutrophils Relative %: 82 %
Platelets: 383 10*3/uL (ref 150–400)
RBC: 5.2 MIL/uL (ref 4.22–5.81)
RDW: 18.8 % — ABNORMAL HIGH (ref 11.5–15.5)
WBC: 11.4 10*3/uL — ABNORMAL HIGH (ref 4.0–10.5)
nRBC: 0 % (ref 0.0–0.2)
nRBC: 0 /100 WBC

## 2021-12-04 LAB — COMPREHENSIVE METABOLIC PANEL
ALT: 19 U/L (ref 0–44)
AST: 30 U/L (ref 15–41)
Albumin: 4 g/dL (ref 3.5–5.0)
Alkaline Phosphatase: 81 U/L (ref 38–126)
Anion gap: 11 (ref 5–15)
BUN: 10 mg/dL (ref 8–23)
CO2: 22 mmol/L (ref 22–32)
Calcium: 9.6 mg/dL (ref 8.9–10.3)
Chloride: 107 mmol/L (ref 98–111)
Creatinine, Ser: 0.94 mg/dL (ref 0.61–1.24)
GFR, Estimated: >60 mL/min (ref >60)
Glucose, Bld: 108 mg/dL — ABNORMAL HIGH (ref 70–99)
Potassium: 3.5 mmol/L (ref 3.5–5.1)
Sodium: 140 mmol/L (ref 135–145)
Total Bilirubin: 1.4 mg/dL — ABNORMAL HIGH (ref 0.3–1.2)
Total Protein: 7.2 g/dL (ref 6.5–8.1)

## 2021-12-04 LAB — BRAIN NATRIURETIC PEPTIDE: B Natriuretic Peptide: 127.8 pg/mL — ABNORMAL HIGH (ref 0.0–100.0)

## 2021-12-04 LAB — RESP PANEL BY RT-PCR (FLU A&B, COVID) ARPGX2
Influenza A by PCR: NEGATIVE
Influenza B by PCR: NEGATIVE
SARS Coronavirus 2 by RT PCR: NEGATIVE

## 2021-12-04 LAB — TROPONIN I (HIGH SENSITIVITY)
Troponin I (High Sensitivity): 10 ng/L (ref <18)
Troponin I (High Sensitivity): 9 ng/L (ref <18)

## 2021-12-04 MED ORDER — ROSUVASTATIN CALCIUM 20 MG PO TABS
20.0000 mg | ORAL_TABLET | Freq: Every day | ORAL | Status: DC
  Administered 2021-12-05 – 2021-12-15 (×11): 20 mg via ORAL
  Filled 2021-12-04 (×11): qty 1

## 2021-12-04 MED ORDER — ENOXAPARIN SODIUM 40 MG/0.4ML IJ SOSY
40.0000 mg | PREFILLED_SYRINGE | INTRAMUSCULAR | Status: DC
  Administered 2021-12-04 – 2021-12-13 (×10): 40 mg via SUBCUTANEOUS
  Filled 2021-12-04 (×10): qty 0.4

## 2021-12-04 MED ORDER — NICOTINE 14 MG/24HR TD PT24
14.0000 mg | MEDICATED_PATCH | Freq: Every day | TRANSDERMAL | Status: DC
  Administered 2021-12-05 – 2021-12-08 (×2): 14 mg via TRANSDERMAL
  Filled 2021-12-04 (×8): qty 1

## 2021-12-04 MED ORDER — CLOPIDOGREL BISULFATE 75 MG PO TABS
75.0000 mg | ORAL_TABLET | Freq: Every day | ORAL | Status: DC
  Administered 2021-12-05 – 2021-12-08 (×4): 75 mg via ORAL
  Filled 2021-12-04 (×4): qty 1

## 2021-12-04 MED ORDER — ALBUTEROL SULFATE (2.5 MG/3ML) 0.083% IN NEBU
5.0000 mg | INHALATION_SOLUTION | Freq: Once | RESPIRATORY_TRACT | Status: AC
  Administered 2021-12-04: 5 mg via RESPIRATORY_TRACT
  Filled 2021-12-04: qty 6

## 2021-12-04 MED ORDER — DICLOFENAC SODIUM 1 % EX GEL
2.0000 g | Freq: Four times a day (QID) | CUTANEOUS | Status: DC | PRN
Start: 2021-12-04 — End: 2021-12-16

## 2021-12-04 MED ORDER — PREDNISONE 20 MG PO TABS
40.0000 mg | ORAL_TABLET | Freq: Every day | ORAL | Status: DC
  Administered 2021-12-04 – 2021-12-06 (×3): 40 mg via ORAL
  Filled 2021-12-04 (×4): qty 2

## 2021-12-04 MED ORDER — IOHEXOL 350 MG/ML SOLN
100.0000 mL | Freq: Once | INTRAVENOUS | Status: AC | PRN
  Administered 2021-12-04: 100 mL via INTRAVENOUS

## 2021-12-04 MED ORDER — IPRATROPIUM-ALBUTEROL 0.5-2.5 (3) MG/3ML IN SOLN
3.0000 mL | RESPIRATORY_TRACT | Status: DC
Start: 2021-12-04 — End: 2021-12-07
  Administered 2021-12-04 – 2021-12-07 (×20): 3 mL via RESPIRATORY_TRACT
  Filled 2021-12-04 (×20): qty 3

## 2021-12-04 MED ORDER — ORAL CARE MOUTH RINSE
15.0000 mL | Freq: Two times a day (BID) | OROMUCOSAL | Status: DC
  Administered 2021-12-05 – 2021-12-15 (×18): 15 mL via OROMUCOSAL

## 2021-12-04 MED ORDER — ASPIRIN 81 MG PO CHEW
81.0000 mg | CHEWABLE_TABLET | Freq: Every day | ORAL | Status: DC
Start: 2021-12-05 — End: 2021-12-16
  Administered 2021-12-05 – 2021-12-15 (×11): 81 mg via ORAL
  Filled 2021-12-04 (×11): qty 1

## 2021-12-04 MED ORDER — FUROSEMIDE 10 MG/ML IJ SOLN
40.0000 mg | Freq: Once | INTRAMUSCULAR | Status: AC
Start: 2021-12-04 — End: 2021-12-04
  Administered 2021-12-04: 40 mg via INTRAVENOUS
  Filled 2021-12-04: qty 4

## 2021-12-04 MED ORDER — PANTOPRAZOLE SODIUM 40 MG PO TBEC
40.0000 mg | DELAYED_RELEASE_TABLET | Freq: Every day | ORAL | Status: DC
  Administered 2021-12-04 – 2021-12-15 (×12): 40 mg via ORAL
  Filled 2021-12-04 (×11): qty 1

## 2021-12-04 NOTE — ED Provider Notes (Signed)
[facility] EMERGENCY DEPARTMENT Provider Note   CSN: [id number] Arrival date & time: 12/04/21  0743     History  Chief Complaint  Patient presents with   Shortness of Irwin A Junio is a 66 y.o. male.  HPI  66 year old male with past medical history of HTN, HLD, everyday smoker presents emergency department with shortness of breath.  Patient states that this started yesterday, became more severe overnight into this morning.  He feels slight tightness in his chest but denies any sharp chest or back pain.  No history of heart failure, no swelling of his lower extremities.  He states that he has a intermittently productive cough of yellow phlegm.  Denies any hemoptysis.  No recent fever/chills.  Patient states that he has been told he has undiagnosed COPD, currently does not have any inhalers or breathing treatments that he utilizes at home.  Home Medications Prior to Admission medications   Medication Sig Start Date End Date Taking? Authorizing Provider  amLODipine (NORVASC) 10 MG tablet Take 10 mg by mouth daily.    [provider]  ASPIRIN LOW DOSE 81 MG EC tablet TAKE 1 TABLET BY MOUTH EVERY DAY 05/23/20   Patwardhan, Manish J, MD  diclofenac Sodium (VOLTAREN) 1 % GEL as needed. 12/23/19   [provider]  etodolac (LODINE) 400 MG tablet Take 400 mg by mouth 2 (two) times daily as needed. 12/23/19   [provider]  lisinopril-hydrochlorothiazide (PRINZIDE,ZESTORETIC) 20-12.5 MG tablet Take 1 tablet by mouth daily.    [provider]  Multiple Vitamins-Minerals (MULTIVITAMIN MEN 50+ PO) Take by mouth daily.    [provider]  omeprazole (PRILOSEC OTC) 20 MG tablet Take 20 mg by mouth daily.    [provider]  ondansetron (ZOFRAN-ODT) 4 MG disintegrating tablet Take 1 tablet (4 mg total) by mouth every 8 (eight) hours as needed for nausea or vomiting. 10/30/21   Myna Bright M, PA-C  rosuvastatin  (CRESTOR) 20 MG tablet Take 1 tablet (20 mg total) by mouth at bedtime. 01/05/20 04/30/20  Tolia, Sunit, DO  tamsulosin (FLOMAX) 0.4 MG CAPS capsule Take 0.4 mg by mouth daily after supper.    [provider]      Allergies    Patient has no known allergies.    Review of Systems   Review of Systems  Constitutional:  Positive for fatigue. Negative for fever.  Respiratory:  Positive for cough, chest tightness and shortness of breath. Negative for choking.   Cardiovascular:  Negative for chest pain.  Gastrointestinal:  Negative for abdominal pain, diarrhea and vomiting.  Genitourinary:  Negative for flank pain.  Musculoskeletal:  Negative for back pain.  Skin:  Negative for rash.  Neurological:  Negative for headaches.   Physical Exam Updated Vital Signs BP 127/73   Pulse 87   Temp 97.8 F (36.6 C) (Oral)   Resp (!) 21   Ht 5\' 9"  (1.753 m)   Wt 73.5 kg   SpO2 96%   BMI 23.92 kg/m  Physical Exam Vitals and nursing note reviewed.  Constitutional:      General: He is in acute distress.     Appearance: Normal appearance. He is not diaphoretic.  HENT:     Head: Normocephalic.     Mouth/Throat:     Mouth: Mucous membranes are moist.  Cardiovascular:     Rate and Rhythm: Normal rate.  Pulmonary:     Effort: Pulmonary effort is normal. Tachypnea  present. No respiratory distress.     Breath sounds: Examination of the right-middle field reveals decreased breath sounds and wheezing. Examination of the left-middle field reveals wheezing. Examination of the right-lower field reveals decreased breath sounds. Examination of the left-lower field reveals decreased breath sounds. Decreased breath sounds and wheezing present. No rales.  Chest:     Chest wall: Tenderness present. No crepitus.  Abdominal:     Palpations: Abdomen is soft.     Tenderness: There is no abdominal tenderness.  Musculoskeletal:     Right lower leg: No edema.     Left lower leg: No edema.  Skin:     General: Skin is warm.  Neurological:     Mental Status: He is alert and oriented to person, place, and time. Mental status is at baseline.  Psychiatric:        Mood and Affect: Mood is anxious.    ED Results / Procedures / Treatments   Labs (all labs ordered are listed, but only abnormal results are displayed) Labs Reviewed  CBC WITH DIFFERENTIAL/PLATELET - Abnormal; Notable for the following components:      Result Value   WBC 11.4 (*)    Hemoglobin 10.7 (*)    HCT 35.9 (*)    MCV 69.0 (*)    MCH 20.6 (*)    MCHC 29.8 (*)    RDW 18.8 (*)    Neutro Abs 9.3 (*)    All other components within normal limits  I-STAT VENOUS BLOOD GAS, ED - Abnormal; Notable for the following components:   pH, Ven 7.452 (*)    pCO2, Ven 34.1 (*)    pO2, Ven 24 (*)    Potassium 3.4 (*)    HCT 37.0 (*)    Hemoglobin 12.6 (*)    All other components within normal limits  RESP PANEL BY RT-PCR (FLU A&B, COVID) ARPGX2  COMPREHENSIVE METABOLIC PANEL  BRAIN NATRIURETIC PEPTIDE  TROPONIN I (HIGH SENSITIVITY)    EKG None  Radiology DG Chest Port 1 View  Result Date: 12/04/2021 CLINICAL DATA:  Shortness of breath EXAM: PORTABLE CHEST 1 VIEW COMPARISON:  Chest x-ray dated October 30, 2021 FINDINGS: Cardiac and mediastinal contours within normal limits. New mild left-greater-than-right airspace opacities. New small left pleural effusion. No evidence of pneumothorax. IMPRESSION: 1. New mild left-greater-than-right airspace opacities, possibly due to pulmonary edema or infection. 2. New small left pleural effusion. Electronically Signed   By: Allegra Lai M.D.   On: 12/04/2021 08:30    Procedures .Critical Care Performed by: Rozelle Logan, DO Authorized by: Rozelle Logan, DO   Critical care provider statement:    Critical care time (minutes):  45   Critical care time was exclusive of:  Separately billable procedures and treating other patients   Critical care was necessary to treat or prevent  imminent or life-threatening deterioration of the following conditions:  Respiratory failure   Critical care was time spent personally by me on the following activities:  Development of treatment plan with patient or surrogate, discussions with consultants, evaluation of patient's response to treatment, examination of patient, ordering and review of laboratory studies, ordering and review of radiographic studies, ordering and performing treatments and interventions, pulse oximetry, re-evaluation of patient's condition and review of old charts   I assumed direction of critical care for this patient from another provider in my specialty: no     Care discussed with: admitting provider      Medications Ordered in ED Medications  albuterol (PROVENTIL) (2.5 MG/3ML) 0.083% nebulizer solution 5 mg (5 mg Nebulization Given 12/04/21 [id number])    ED Course/ Medical Decision Making/ A&P                           Medical Decision Making Amount and/or Complexity of Data Reviewed Labs: ordered. Radiology: ordered.  Risk Prescription drug management.   66 year old male presents emergency department shortness of breath.  Noted to be hypoxic in triage into the 60s, improved into the 80s on nonrebreather.  Patient is awake, conversational.  Decreased breath sounds bilaterally with scattered wheezes.  DuoNeb trialed.  EKG shows no acute ischemic changes.  Blood work shows a white blood cell count of 11.4.  Slight anemia at 10.7, baseline 13, denies any rectal bleeding/dark stools.  Troponins are negative, flu and COVID swab is negative.  BNP is slightly elevated at 127.  Chest x-ray shows bilateral infiltrates versus edema with a pleural effusion.  Due to the degree of hypoxia CT PE study was done.  No pulmonary embolism but confirms pulmonary edema/pleural effusion.  Patient's been transitioned to high flow nasal cannula.  We will attempt some diuresis and plan for inpatient admission.  Patients evaluation and  results requires admission for further treatment and care.  Spoke with hospitalist, reviewed patient's ED course and they accept admission.  Patient agrees with admission plan, offers no new complaints and is stable/unchanged at time of admit.        Final Clinical Impression(s) / ED Diagnoses Final diagnoses:  None    Rx / DC Orders ED Discharge Orders     None         Lorelle Gibbs, DO 12/04/21 1345

## 2021-12-04 NOTE — Hospital Course (Addendum)
#Acute hypoxemic respiratory failure #Pulmonary edema Patient presented with hypoxia to 72% with inadequate response to [place], requiring placement on NRB. He had mild leukocytosis of 11.4, negative viral panel, BNP slightly elevated at 127, and CXR with small L pleural effusion and L>R airspace opacities. CTA did not show PE but moderate pulmonary edema with L>R pleural effusions and adjacent bibasilar consolidations, as well as evidence of mediastinal and BL hilar lymphadenopathy, possibly reactive. He was started on IV diuresis, nebulizer treatment, and steroids, and admitted. Echo was obtained which showed LF EF 60-65% with normal function and no wall motion abnormalities. Inflammatory markers were WNL. An ABG was collected whigh showed 7.46/35/96 on FiO2 100% with a sifnificantly elevated A-a gradient when calculated with concern for shunting. A bubble study was done and was negative. Pulmonology was consulted and recommended restarting high-dose steroids and checking angiotensin-converting enzyme level which was normal. Because of his increased oxygen requirements an EBUS was not able to be completed. ANA was unremarkable however ANA was positive with  speckled pattern which could indicate SLE,  Sjogrens Syndrome, mixed connective tissue disease. HRCT was obtained and showed no evidence of ILD, 3 mm medial LLL nodule with recommended f/u in 12 months, slight decrease in mediastinal adenopathy, enlarged pulmonic trunk suggesting PAH, and emphysema. Cardiology was consulted and did not feel that possible PAH noted on HRCT was contributing to his presentation and recommended continued gdmt with V/Q and sleep studies as outpatient. V/Q scan was ordered and did not reveal any perfusion defects. He was able to slowly titrate down oxygen supplementation requirements throughout his admission, but will require home oxygen at discharge. Overall he is net -24L. Leukocytosis throughout admission attributed to high-dose  steroids was stable, and steroid taper was initiated 06/11.   #Acute HFpEF (EF 60-65%) EKG on admission did not show acute ischemic changes and troponins were flat. Echocardiogram obtained early in admission showed LF EF 60-65% with normal function and no wall motion abnormalities. Cardiology recommendation of starting Wilder Glade and Delene Loll was implemented.    #Paroxysmal atrial fibrillation, CHAD2DS-VAS2c score 3 Overnight 06/06 patient went into AF with HR in the 140s. This was new to this admission and patient has no prior history of arrhythmias. He was started on diltiazem drip and corrected quickly to NSR. He was transitioned to PO diltiazem 06/07 and started on Eliquis 06/11.     #Iron deficiency anemia Admission labs showed microcytic anemia with Hgb of 10.7 and MCV 69 with target cells. He denied episodes of bleeding including in his stool. Iron studies were collected and showed ferritin 21, iron 19, TIBC elevated to 458, 4% saturation. Iron deficit calculated to be 840 mg so he received high dose iron prior to transitioning to daily oral supplementation. Hgb remained stable throughout admission.  #Abnormal TSH TSH checked on admission was mildly low. Free T4 was WNL. Thyroid ultrasound was obtained and showed tiny hypoechoic nodules or cysts in the L thyroid lobe that do not meet criteria for biopsy or dedicated follow-up, as well as prominent lymph node in R supraclavicular area and prominent mediastinal and hilar LN on recent chest CT. TSH was WNL prior to discharge, deficit likely due to acute illness.   #Tobacco use disorder Patient received a nicotine patch and was encouraged to quit smoking.   #Peripheral vascular disease Patient was continued on home ASA, rosuvastatin. Cardiology recommended holding Plavix. Risk stratification labs were also obtained prior to discharge including TSH, lipid panel, HbA1c, which were all WNL.   #  Hypertension Patient was normotensive throughout  admission. Home antihypertensives were discontinued on discharge.

## 2021-12-04 NOTE — Plan of Care (Signed)
  Problem: Education: Goal: Knowledge of General Education information will improve Description: Including pain rating scale, medication(s)/side effects and non-pharmacologic comfort measures Outcome: Progressing   Problem: Clinical Measurements: Goal: Ability to maintain clinical measurements within normal limits will improve Outcome: Progressing Goal: Respiratory complications will improve Outcome: Progressing   

## 2021-12-04 NOTE — ED Notes (Signed)
Rt at bedside to place pt on High Flow

## 2021-12-04 NOTE — ED Triage Notes (Signed)
Pt arrived POV from home c/o [facility] since about 430am yesterday. Pt states he tried to rest and has been up since midnight up able to lay flat. Pt is sating 72% on RA. Pt placed on 6L [place], pt still sating 84%

## 2021-12-04 NOTE — H&P (Cosign Needed Addendum)
Date: 12/04/2021               Patient Name:  Hector [name] MRN: [id number]  DOB: 19-Feb-1956 Age / Sex: 66 y.o., male   PCP: [facility]         Medical Service: Internal Medicine Teaching Service         Attending Physician: Dr. Rozelle Logan, DO    First Contact: Dayna Barker Pager: [phone]  Second Contact: Dr. Evlyn Kanner Pager: [phone]       After Hours (After 5p/  First Contact Pager: [phone]  weekends / holidays): Second Contact Pager: [phone]   Chief Complaint: Worsening cough and difficulty breathing  History of Present Illness: ICHAEL [name] is a 66 y.o. male with PMHx of PAD, HLD, HTN, alcohol abuse, and daily tobacco use presenting with worsening cough and difficulty breathing, admitted for hypoxia.   Mr. Hopfensperger states that he has a non-productive cough at baseline but last night around 10 PM was coughing more often and coughing up phlegm. He notes that he has been sleeping inclined with 2 pillows under him from > 10 years, but woke up out of sleep this morning around 4:30 AM with difficulty breathing. He also felt like his heart was pounding in his chest. He notes that his breathing is slightly better at rest compared to on exertion. He denies chest pain except for when he coughs. He denies fever, chills, rhinorrhea, abdominal pain, and recent illness. He denies constipation, hematemesis, melena, and pica. He endorses chronic pain in his shoulders, back, knees, and on the soles of his feet, as well as dizziness which has been present for several weeks and is worsened when he stands up. He states that he has not been diagnosed with CHF or COPD in the past but notes that his prior PCP at the [place] is no longer there, and it appears he only recently established care in Sept 2022.   ED Course On arrival, O2 sat 72%, placed on 6L with O2 sat still low at 84% so placed on non-rebreather mask.  EKG with no acute ischemic changes. Labs notable for WBC of 11.4,  microcytic anemia of 10.7, tbili 1.4, negative troponin, negative flu and COVID testing, slightly elevated BNP at 127. CXR with small L pleural effusion and L>R airspace opacities. CTa obtained due to concern for PE given degree of hypoxia, no PE but moderate pulmonary edema with L>R pleural effusions and adjacent bibasilar consolidations, likely atelectasis. Also evidence of mediastinal and BL hilar lymphadenopathy, possibly reactive. Transitioned to high flow nasal cannula, given albuterol 5 mg and IV Lasix 40 mg x1.    Meds:  Current Meds  Medication Sig   amLODipine (NORVASC) 10 MG tablet Take 10 mg by mouth daily.   ASPIRIN LOW DOSE 81 MG EC tablet TAKE 1 TABLET BY MOUTH EVERY DAY   clopidogrel (PLAVIX) 75 MG tablet Take 75 mg by mouth daily.   diclofenac Sodium (VOLTAREN) 1 % GEL as needed.   etodolac (LODINE) 400 MG tablet Take 400 mg by mouth 2 (two) times daily as needed.   lisinopril-hydrochlorothiazide (PRINZIDE,ZESTORETIC) 20-12.5 MG tablet Take 1 tablet by mouth daily.   Multiple Vitamins-Minerals (MULTIVITAMIN MEN 50+ PO) Take by mouth daily.   omeprazole (PRILOSEC OTC) 20 MG tablet Take 20 mg by mouth daily.   ondansetron (ZOFRAN-ODT) 4 MG disintegrating tablet Take 1 tablet (4 mg total) by mouth every 8 (eight) hours as needed for nausea or  vomiting.   rosuvastatin (CRESTOR) 20 MG tablet Take 1 tablet (20 mg total) by mouth at bedtime.    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 Lives in apartment in [place]. Has struggled with homelessness in the past, [facility] helped [facility]. Refrigerator not working so diet mostly sandwiches. Daughter and sister also live in [place], but patient is not close to sister. States that he is his only support system. Smokes 1ppd since he was a teenager, approx 48 pack/yr smoking history. History of alcohol abuse but reports that he currently drinks only a few times monthly. Denies other illicit substance use.   Review of Systems: A complete ROS was negative except as per HPI.   Physical Exam: Blood pressure 126/69, pulse 83, temperature 97.8 F (36.6 C), temperature source Oral, resp. rate (!) 24, height 5\' 9"  (1.753 m), weight 73.5 kg, SpO2 94 %.  General: Sitting in bed, in NAD  CV: Tachycardic, normal rhythm, no JVD seen  Pulm: Occasional expiratory wheezes heard, coughs on deep inspiration, mild tachypnea, on HF nasal cannula Abd: Soft, nontender, +BS Extremities: 2+ pitting edema of RLE, healed incision from prior LE stents  Skin: Warm, dry Neuro: A&O x 3, no focal deficits present Psych: Normal behavior and affect   EKG: personally reviewed my interpretation is sinus rhythm, RAD, T wave inversions in lead III which were present on EKG from 10/30/21 and in lead VI  CXR: personally reviewed my interpretation is L pleural effusion  Assessment & Plan by Problem:  # Acute respiratory failure 2/2 COPD exacerbation vs HF exacerbation  Patient presenting with acute respiratory failure, hypoxic to 72%. He does have a ~48 pack-year smoking history and endorses baseline non-productive cough. However, he also has LE edema, CTa with pleural edema and BL pleural effusion, and has had to sleep inclined on 2 pillows for >10 years. He has not formally been diagnosed with COPD in the past but given his chronic cough and lengthy tobacco exposure I strongly suspect that he has it.  He has not had spirometry testing. Will treat with duo nebs and steroids tonight and consider IV mag tomorrow if no improvement in O2 status. Unclear if hypoxia is due to COPD exacerbation or if there is a component of CHF. He reportedly had prior Echo on 07/08/2018 which reportedly showed LVEF 61%, moderate LVH, normal diastolic filling pattern, and mildly dilated left atrium but I am unable to find the actual results in his chart. Will order Echo to assess for CHF.  - Continuous pulse ox  - Start Duonebs  - Start prednisone 40 mg tablet  - Obtain Echo  - Start nicotine patch   # Microcytic anemia  Patient with Hgb 10.7 and MCV 69 on arrival as well as target cells present, H&H rechecked with Hgb 12.6.  Patient denies hematemesis, melena, and pica. No  record of colonoscopy in chart. Will check ferritin and iron panel and CMP as tbili was slightly elevated to 1.4 and liver dysfunction can cause target cells. - Check ferritin, iron panel, CMP   # HTN  Patient endorses dizziness present for several months that occurs upon standing. Home BP regimen is amlodipine 10 mg daily and lisinopril-HCTZ 20-12.5 mg tablet daily. BP has been normotensive since arrival ranging from 112-127/67-73. Suspect orthostatic hypotension, will hold BP meds and check orthostatic vitals.  - Hold home BP meds - Check orthostatic vitals   # PAD S/p bilateral SFA stents.  - Continue home aspirin 81 mg, rosuvastatin 20 mg tablet, and clopidogrel 75 mg tablet daily    # GERD  Home med is omeprazole 20 mg tablet daily.  - Protonix 40 mg daily   # Chronic pain  # Osteoarthritis  Reports chronic pain in shoulders, back, knees, and soles of his feet. Has bilateral hip XR in Feb 2023 which showed BL mild-to-moderate hip osteoarthritis.  - Voltaren gel PRN   Prior to Admission Living Location: Home Anticipated Discharge Location: Home IVF: None Code: Full Dispo: Admit patient to Observation with expected length of stay less  than 2 midnights.  Signed: Orland DecBlanks, Jessica L, Medical Student 12/04/2021, 1:48 PM  Pager: [phone]  Please contact the on call pager after 5 pm and on weekends at [phone]. After 5pm on weekdays and 1pm on weekends: On Call pager: [phone]  Attestation for Student Documentation:  I personally was present and performed or re-performed the history, physical exam and medical decision-making activities of this service and have verified that the service and findings are accurately documented in the student's note.  Maudie FlakesWayne Goecke is [phone] with peripheral vascular disease, HLD, HTN, daily tobacco use [facility]. Patient was in his normal state of health prior to yesterday, when he started to become increasingly dyspneic. Has had increased sputum production and cough from his baseline. Not on O2 regularly. No recent illness or sick contacts.   On my exam, patient resting in no acute distress. RRR, no murmurs, no JVD. Rales appreciated on LLL, intermittent end-expiratory wheezing. Awake, alert, conversing appropriately. Rest of exam is unremarkable.   Acute hypoxic respiratory failure concerning for COPD v HF exacerbation  Mixed picture regarding etiology. No formal diagnosis of COPD or HF. He is experiencing 3/3 cardinal symptoms, has a significant tobacco use history, and has end-expiratory wheezing on exam. In addition, he has pitting edema, pulmonary congestion on CXR, elevated BNP. Received empiric diuretics in ED, will also start steroids and bronchodilators as outlined in the student's note. We will check Echo while inpatient. Will need PFT's after discharge.   2.   Microcytic anemia   No hx GI bleed. Not experiencing melena or BRBPR. Will obtain iron studies.  3.   Hypertension  Was experiencing some dizziness, will obtain orthostatics. Has been normotensive since arrival, will continue holding home anti-hypertensives.   Evlyn KannerPhillip Kendle Erker, MD Internal Medicine  Resident PGY-2 Pager: [phone] 12/04/2021, 6:26 PM

## 2021-12-04 NOTE — ED Notes (Signed)
Patient resting comfortably at this time. This RN provided patient with warm blankets upon request. No complaints of pain. NIBP, cardiac monitor and pulse ox remain in place.

## 2021-12-05 ENCOUNTER — Encounter (HOSPITAL_COMMUNITY): Payer: Self-pay | Admitting: Internal Medicine

## 2021-12-05 ENCOUNTER — Inpatient Hospital Stay (HOSPITAL_COMMUNITY): Payer: No Typology Code available for payment source

## 2021-12-05 DIAGNOSIS — R0609 Other forms of dyspnea: Secondary | ICD-10-CM

## 2021-12-05 LAB — URINALYSIS, ROUTINE W REFLEX MICROSCOPIC
Bilirubin Urine: NEGATIVE
Glucose, UA: NEGATIVE mg/dL
Hgb urine dipstick: NEGATIVE
Ketones, ur: NEGATIVE mg/dL
Nitrite: NEGATIVE
Protein, ur: NEGATIVE mg/dL
Specific Gravity, Urine: 1.011 (ref 1.005–1.030)
pH: 5 (ref 5.0–8.0)

## 2021-12-05 LAB — RETIC PANEL
Immature Retic Fract: 29.9 % — ABNORMAL HIGH (ref 2.3–15.9)
RBC.: 4.16 MIL/uL — ABNORMAL LOW (ref 4.22–5.81)
Retic Count, Absolute: 134 10*3/uL (ref 19.0–186.0)
Retic Ct Pct: 3.2 % — ABNORMAL HIGH (ref 0.4–3.1)
Reticulocyte Hemoglobin: 17.8 pg — ABNORMAL LOW (ref >27.9)

## 2021-12-05 LAB — COMPREHENSIVE METABOLIC PANEL
ALT: 17 U/L (ref 0–44)
AST: 24 U/L (ref 15–41)
Albumin: 3.1 g/dL — ABNORMAL LOW (ref 3.5–5.0)
Alkaline Phosphatase: 60 U/L (ref 38–126)
Anion gap: 12 (ref 5–15)
BUN: 15 mg/dL (ref 8–23)
CO2: 21 mmol/L — ABNORMAL LOW (ref 22–32)
Calcium: 9 mg/dL (ref 8.9–10.3)
Chloride: 106 mmol/L (ref 98–111)
Creatinine, Ser: 1 mg/dL (ref 0.61–1.24)
GFR, Estimated: >60 mL/min (ref >60)
Glucose, Bld: 155 mg/dL — ABNORMAL HIGH (ref 70–99)
Potassium: 3.4 mmol/L — ABNORMAL LOW (ref 3.5–5.1)
Sodium: 139 mmol/L (ref 135–145)
Total Bilirubin: 0.7 mg/dL (ref 0.3–1.2)
Total Protein: 6 g/dL — ABNORMAL LOW (ref 6.5–8.1)

## 2021-12-05 LAB — ECHOCARDIOGRAM COMPLETE
AR max vel: 3.18 cm2
AV Area VTI: 3.29 cm2
AV Area mean vel: 3.06 cm2
AV Mean grad: 12 mmHg
AV Peak grad: 19.4 mmHg
Ao pk vel: 2.2 m/s
Area-P 1/2: 3.24 cm2
Height: 69 in
MV VTI: 4.38 cm2
S' Lateral: 2.7 cm
Weight: 2666.68 oz

## 2021-12-05 LAB — HEMOGLOBIN AND HEMATOCRIT, BLOOD
HCT: 29 % — ABNORMAL LOW (ref 39.0–52.0)
Hemoglobin: 8.7 g/dL — ABNORMAL LOW (ref 13.0–17.0)

## 2021-12-05 LAB — BASIC METABOLIC PANEL
Anion gap: 11 (ref 5–15)
BUN: 16 mg/dL (ref 8–23)
CO2: 21 mmol/L — ABNORMAL LOW (ref 22–32)
Calcium: 9 mg/dL (ref 8.9–10.3)
Chloride: 104 mmol/L (ref 98–111)
Creatinine, Ser: 1.28 mg/dL — ABNORMAL HIGH (ref 0.61–1.24)
GFR, Estimated: >60 mL/min (ref >60)
Glucose, Bld: 137 mg/dL — ABNORMAL HIGH (ref 70–99)
Potassium: 4 mmol/L (ref 3.5–5.1)
Sodium: 136 mmol/L (ref 135–145)

## 2021-12-05 LAB — IRON AND TIBC
Iron: 19 ug/dL — ABNORMAL LOW (ref 45–182)
Saturation Ratios: 4 % — ABNORMAL LOW (ref 17.9–39.5)
TIBC: 458 ug/dL — ABNORMAL HIGH (ref 250–450)
UIBC: 439 ug/dL

## 2021-12-05 LAB — TYPE AND SCREEN
ABO/RH(D): O POS
Antibody Screen: NEGATIVE

## 2021-12-05 LAB — CBC
HCT: 28.2 % — ABNORMAL LOW (ref 39.0–52.0)
Hemoglobin: 8.4 g/dL — ABNORMAL LOW (ref 13.0–17.0)
MCH: 20.4 pg — ABNORMAL LOW (ref 26.0–34.0)
MCHC: 29.8 g/dL — ABNORMAL LOW (ref 30.0–36.0)
MCV: 68.4 fL — ABNORMAL LOW (ref 80.0–100.0)
Platelets: 335 10*3/uL (ref 150–400)
RBC: 4.12 MIL/uL — ABNORMAL LOW (ref 4.22–5.81)
RDW: 18.5 % — ABNORMAL HIGH (ref 11.5–15.5)
WBC: 10 10*3/uL (ref 4.0–10.5)
nRBC: 0 % (ref 0.0–0.2)

## 2021-12-05 LAB — T4, FREE: Free T4: 1.01 ng/dL (ref 0.61–1.12)

## 2021-12-05 LAB — HIV ANTIBODY (ROUTINE TESTING W REFLEX): HIV Screen 4th Generation wRfx: NONREACTIVE

## 2021-12-05 LAB — FERRITIN: Ferritin: 21 ng/mL — ABNORMAL LOW (ref 24–336)

## 2021-12-05 LAB — TSH: TSH: 0.311 u[IU]/mL — ABNORMAL LOW (ref 0.350–4.500)

## 2021-12-05 LAB — MAGNESIUM: Magnesium: 1.8 mg/dL (ref 1.7–2.4)

## 2021-12-05 LAB — MRSA NEXT GEN BY PCR, NASAL: MRSA by PCR Next Gen: NOT DETECTED

## 2021-12-05 MED ORDER — SODIUM CHLORIDE 0.9 % IV SOLN
250.0000 mg | Freq: Once | INTRAVENOUS | Status: AC
  Administered 2021-12-05: 250 mg via INTRAVENOUS
  Filled 2021-12-05: qty 20

## 2021-12-05 MED ORDER — POTASSIUM CHLORIDE CRYS ER 20 MEQ PO TBCR
40.0000 meq | EXTENDED_RELEASE_TABLET | Freq: Once | ORAL | Status: AC
  Administered 2021-12-05: 40 meq via ORAL
  Filled 2021-12-05: qty 2

## 2021-12-05 MED ORDER — MAGNESIUM SULFATE 50 % IJ SOLN
3.0000 g | Freq: Once | INTRAVENOUS | Status: AC
  Administered 2021-12-05: 3 g via INTRAVENOUS
  Filled 2021-12-05: qty 6

## 2021-12-05 MED ORDER — FUROSEMIDE 10 MG/ML IJ SOLN
40.0000 mg | Freq: Once | INTRAMUSCULAR | Status: AC
Start: 2021-12-05 — End: 2021-12-05
  Administered 2021-12-05: 40 mg via INTRAVENOUS
  Filled 2021-12-05: qty 4

## 2021-12-05 NOTE — Plan of Care (Signed)

## 2021-12-05 NOTE — Progress Notes (Signed)
Mobility Specialist Progress Note    12/05/21 1042  Mobility  Activity Transferred from bed to chair  Level of Assistance Standby assist, set-up cues, supervision of patient - no hands on  Assistive Device None  Distance Ambulated (ft) 4 ft  Activity Response Tolerated well  $Mobility charge 1 Mobility   Pt received and agreeable. Dry coughing throughout. Left with call bell in reach.   [place] Mobility Specialist

## 2021-12-05 NOTE — Progress Notes (Signed)
Echocardiogram 2D Echocardiogram has been performed.  Warren Lacy Shadiamond Koska RDCS 12/05/2021, 10:11 AM

## 2021-12-05 NOTE — TOC Progression Note (Signed)
Transition of Care [facility]) - Progression Note    Patient Details  Name: Hector [name] MRN: [id number] Date of Birth: 05/27/1956  Transition of Care [facility]) CM/SW Contact  Beckie Busing, RN Phone Number:[phone]  12/05/2021, 9:28 AM  Clinical Narrative:    TOC  screen for  66 y.o. male with PMHx of PAD, HLD, HTN, alcohol abuse, and daily tobacco use presenting with worsening cough and difficulty breathing, admitted for hypoxia. Currently there are no TOC needs. TOC will continue to follow.        Expected Discharge Plan and Services                                                 Social Determinants of Health (SDOH) Interventions    Readmission Risk Interventions     View : No data to display.

## 2021-12-05 NOTE — Progress Notes (Addendum)
Hospital day: 1  Subjective:   Overnight events: No acute overnight events.   Patient seen at bedside during morning rounds. Continues on high flow nasal cannula. States that he slept well, did have some coughing fits but not producing as much phlegm. Feels like his breathing is better.   Objective:  Vital signs in last 24 hours: Vitals:   12/04/21 2248 12/05/21 0007 12/05/21 0453 12/05/21 0604  BP:    119/62  Pulse: 85 89 91 90  Resp: 18 (!) 21 (!) 25 (!) 25  Temp:    98.7 F (37.1 C)  TempSrc:    Oral  SpO2: 92% 92% 94% 91%  Weight:    75.6 kg  Height:        Filed Weights   12/04/21 0753 12/04/21 2227 12/05/21 0604  Weight: 73.5 kg 75.6 kg 75.6 kg     Intake/Output Summary (Last 24 hours) at 12/05/2021 [id number] Last data filed at 12/05/2021 [id number] Gross per 24 hour  Intake --  Output 1800 ml  Net -1800 ml   Net IO Since Admission: -1,800 mL [12/05/21 0712]   Pertinent Labs:    Latest Ref Rng & Units 12/04/2021    8:20 AM 12/04/2021    8:10 AM 10/30/2021   10:00 AM  CBC  WBC 4.0 - 10.5 K/uL  11.4   4.7    Hemoglobin 13.0 - 17.0 g/dL 12.6   10.7   13.6    Hematocrit 39.0 - 52.0 % 37.0   35.9   43.8    Platelets 150 - 400 K/uL  383   310         Latest Ref Rng & Units 12/05/2021    5:43 AM 12/04/2021    8:20 AM 12/04/2021    8:10 AM  CMP  Glucose 70 - 99 mg/dL 155    108    BUN 8 - 23 mg/dL 15    10    Creatinine 0.61 - 1.24 mg/dL 1.00    0.94    Sodium 135 - 145 mmol/L 139   140   140    Potassium 3.5 - 5.1 mmol/L 3.4   3.4   3.5    Chloride 98 - 111 mmol/L 106    107    CO2 22 - 32 mmol/L 21    22    Calcium 8.9 - 10.3 mg/dL 9.0    9.6    Total Protein 6.5 - 8.1 g/dL 6.0    7.2    Total Bilirubin 0.3 - 1.2 mg/dL 0.7    1.4    Alkaline Phos 38 - 126 U/L 60    81    AST 15 - 41 U/L 24    30    ALT 0 - 44 U/L 17    19      No results for input(s): GLUCAP in the last 72 hours.   Imaging: CT Angio Chest PE W/Cm &/Or Wo Cm  Result Date: 12/04/2021 CLINICAL  DATA:  Pulmonary embolism (PE) suspected, high prob EXAM: CT ANGIOGRAPHY CHEST WITH CONTRAST TECHNIQUE: Multidetector CT imaging of the chest was performed using the standard protocol during bolus administration of intravenous contrast. Multiplanar CT image reconstructions and MIPs were obtained to evaluate the vascular anatomy. RADIATION DOSE REDUCTION: This exam was performed according to the departmental dose-optimization program which includes automated exposure control, adjustment of the mA and/or kV according to patient size and/or use of iterative reconstruction technique. CONTRAST:  156mL OMNIPAQUE IOHEXOL  350 MG/ML SOLN COMPARISON:  CT 02/04/2013. FINDINGS: Cardiovascular: Satisfactory opacification of the pulmonary arteries to the segmental level. No evidence of pulmonary embolism. Normal cardiac size.No pericardial disease. Mild atherosclerosis of the thoracic aorta. Mediastinum/Nodes: There is mediastinal and hilar lymphadenopathy. For reference: (Tracheal lymph node measures 2.0 cm (series 5, image 55). Right hilar lymph node measures 1.5 cm (series 5, image 74). The thyroid is unremarkable. Esophagus is unremarkable. Lungs/Pleura: Mid to apical predominant centrilobular and paraseptal emphysema. There is diffuse interlobular septal thickening and ground-glass opacities. There are small bilateral pleural effusions, left greater than right, with adjacent basilar consolidations. No pneumothorax. Upper Abdomen: No acute abnormality. Musculoskeletal: No chest wall abnormality. No acute or significant osseous findings. Review of the MIP images confirms the above findings. IMPRESSION: Moderate pulmonary edema with small, left greater than right pleural effusions. Adjacent bibasilar consolidations, favored to be atelectasis. No evidence of pulmonary embolism. Mediastinal and bilateral hilar lymphadenopathy, possibly reactive. Recommend follow-up chest CT after resolution of acute illness in 6-12 weeks.  Electronically Signed   By: Maurine Simmering M.D.   On: 12/04/2021 12:23   DG Chest Port 1 View  Result Date: 12/04/2021 CLINICAL DATA:  Shortness of breath EXAM: PORTABLE CHEST 1 VIEW COMPARISON:  Chest x-ray dated October 30, 2021 FINDINGS: Cardiac and mediastinal contours within normal limits. New mild left-greater-than-right airspace opacities. New small left pleural effusion. No evidence of pneumothorax. IMPRESSION: 1. New mild left-greater-than-right airspace opacities, possibly due to pulmonary edema or infection. 2. New small left pleural effusion. Electronically Signed   By: Yetta Glassman M.D.   On: 12/04/2021 08:30    Physical Exam  General: Pleasant male, sitting in bed  HEENT: Normocephalic, anicteric sclerae, HF nasal cannula present CV: Regular rate, no murmurs heard Pulmonary: Diffuse crackles present bilaterally in lower lobes, no wheezes heard  Abdominal: Soft, nontender Extremities: No LE edema Skin: Warm, dry  Neuro: A&O x3 Psych: Normal behavior and affect  Assessment/Plan: Hector [name] is a 66 y.o. male with a history of HTN and PAD presenting with worsening cough and shortness of breath, admitted for acute hypoxemic respiratory failure.   Principal Problem:   Acute hypoxemic respiratory failure (HCC) Active Problems:   Respiratory failure with hypoxia (HCC)  # Acute hypoxemic respiratory failure 2/2 COPD vs CHF exacerbation Reports some improvement in breathing, still requiring 20L of HFNC. Lung exam with diffuse crackles, will administer another dose of IV Lasix. Echo scheduled for this morning. If Echo results are consistent with CHF, will consult Cardiology. K+ low at 3.4, Mg low normal at 1.8. Will supplement with oral K to maintain K > 4  and IV mag for Mg > 2. He may derive additional benefit from IV Mg from a respiratory standpoint as it has short-acting bronchodilator activity.  - IV Lasix 40 mg x 1 dose - Start IV Mag 3g over 4 hours  - Start oral Kcl 40 mEq  x1 dose - f/u Echo, consider Cards consult  - Continue prednisone 40 mg daily  - Continue duonebs q4h  - Check BMP + Mg tomorrow AM   # PAD  S/p bilateral SFA stents.  - Continue home ASA 81 mg and clopidogrel 75 mg daily   # Iron deficiency anemia  Patient with Hgb 10.7 and MCV 69 on arrival as well as target cells present, H&H rechecked with Hgb 12.6. Ferritin 21, iron 19, TIBC elevated to 458, 4% saturation. Calculated iron deficit is 840 mg, will administer 250 mg of IV  iron in 250 mL of fluid then switch to oral iron supplementation tomorrow. Patient denies symptoms of active bleeding and notes he had a normal colonoscopy approx 4 years ago.  - Start IV iron 250 mg x 1  - Start oral ferrous sulfate 325 mg tablet tomorrow   # HTN  Patient endorses dizziness present for several months that occurs upon standing. Home BP regimen is amlodipine 10 mg daily and lisinopril-HCTZ 20-12.5 mg tablet daily. Suspect orthostatic hypotension, will hold BP meds and follow-up on orthostatic vitals.  - Hold home BP meds - Check orthostatic vitals   # GERD  Home med is omeprazole 20 mg tablet daily.  - Protonix 40 mg daily    # Chronic pain  # Osteoarthritis  Reports chronic pain in shoulders, back, knees, and soles of his feet. Has bilateral hip XR in Feb 2023 which showed BL mild-to-moderate hip osteoarthritis.  - Voltaren gel PRN    Diet: Regular IVF: None VTE: Lovenox CODE: Full  Prior to Admission Living Arrangement: Home Anticipated Discharge Location: Home Barriers to Discharge: Medical stability  Dispo: Admit to inpatient with anticipated stay > 2 midnights.  Signed: Sabino Dick, MS4 Internal Medicine Teaching Service [phone] Please contact the on call pager after 5 pm and on weekends at [phone].

## 2021-12-06 ENCOUNTER — Inpatient Hospital Stay (HOSPITAL_COMMUNITY): Payer: No Typology Code available for payment source

## 2021-12-06 LAB — BASIC METABOLIC PANEL
Anion gap: 6 (ref 5–15)
BUN: 15 mg/dL (ref 8–23)
CO2: 23 mmol/L (ref 22–32)
Calcium: 8.8 mg/dL — ABNORMAL LOW (ref 8.9–10.3)
Chloride: 109 mmol/L (ref 98–111)
Creatinine, Ser: 0.98 mg/dL (ref 0.61–1.24)
GFR, Estimated: >60 mL/min (ref >60)
Glucose, Bld: 130 mg/dL — ABNORMAL HIGH (ref 70–99)
Potassium: 4 mmol/L (ref 3.5–5.1)
Sodium: 138 mmol/L (ref 135–145)

## 2021-12-06 LAB — CBC
HCT: 27.6 % — ABNORMAL LOW (ref 39.0–52.0)
Hemoglobin: 8.2 g/dL — ABNORMAL LOW (ref 13.0–17.0)
MCH: 20.6 pg — ABNORMAL LOW (ref 26.0–34.0)
MCHC: 29.7 g/dL — ABNORMAL LOW (ref 30.0–36.0)
MCV: 69.3 fL — ABNORMAL LOW (ref 80.0–100.0)
Platelets: 360 10*3/uL (ref 150–400)
RBC: 3.98 MIL/uL — ABNORMAL LOW (ref 4.22–5.81)
RDW: 18.5 % — ABNORMAL HIGH (ref 11.5–15.5)
WBC: 14.5 10*3/uL — ABNORMAL HIGH (ref 4.0–10.5)
nRBC: 0 % (ref 0.0–0.2)

## 2021-12-06 LAB — T3: T3, Total: 92 ng/dL (ref 71–180)

## 2021-12-06 LAB — MAGNESIUM: Magnesium: 2.5 mg/dL — ABNORMAL HIGH (ref 1.7–2.4)

## 2021-12-06 MED ORDER — FUROSEMIDE 10 MG/ML IJ SOLN
40.0000 mg | Freq: Two times a day (BID) | INTRAMUSCULAR | Status: DC
  Administered 2021-12-06 – 2021-12-11 (×12): 40 mg via INTRAVENOUS
  Filled 2021-12-06 (×12): qty 4

## 2021-12-06 NOTE — Plan of Care (Signed)

## 2021-12-06 NOTE — Progress Notes (Addendum)
IMTS Daily Note  Subjective: Continues on 20L of HFNC with desaturations to the high 80s.  No clear cause observed.  He has no other symptoms except SOB. He is mildly frustrated with being stuck in the hospital given he has dogs at home.   Objective:  Vital signs in last 24 hours: Vitals:   12/06/21 0126 12/06/21 0452 12/06/21 0505 12/06/21 0550  BP:  117/67    Pulse:  88    Resp:  18    Temp:  98.4 F (36.9 C)    TempSrc:  Oral    SpO2: 95% 94% 93% (!) 88%  Weight:  75.3 kg    Height:       Gen: Awake, alert, no distress HENT: Supple, palpable thyroid, nodular with right sided nodule CV: RR, NR, no murmur Pulm: Crackles to mid lung fields, wearing 20L of HFNC Abd: Soft, NT MSK: NOrmal tone and bulk, no LE edema  Assessment/Plan:  Acute hypoxemic respiratory failure - TTE did not show CHF, so noncardiogenic cause most likely - DDx includes acute anemia (see below), atypical viral infection (adeno, entero, parvo).  TSH is also mildly low making isolated pulmonary edema due to thyrotoxicosis a possibility, also flash pulmonary edema 2/2 HTN ( blood pressures have been low normal while here).  Less likely though on the differential, Granulomatosis with polyangiitis (infiltrates unlikely to be hemorrhage on CT per my discussion with radiology today); patient reports chronic sinus disease to me today), sarcoidosis (also with chronic pyuria?  Could be related) - HIV negative, COVID neg, flu neg - Increase lasix to $Remove'40mg'AVHYTuy$  BID - Wean oxygen as possible - Consider ABG and repeat imaging if unable to wean oxygen in next 24-48 hours - ? Need for viral respiratory panel - Stop steroids, WBC elevation likely related to this - Strict I/Os - Check ESR, CRP - Check urine eosinophils  Acute on chronic microcytic anemia, iron deficiency - Per Chart Review (care everywhere) colonoscopy appears to have been done at [facility] in April of 2021, cannot view results at this time - No clear bleeding  per patient - Iron is low, received 1 dose of IV iron - With no further symptoms, will not consult GI at this time.  Possibly on Monday we can call [facility] and get records from the colonoscopy - Retic count is not impressively elevated, possibly due to severe iron deficiency - He notes no nutritional deficiencies or difficulty with eating - Trend H/H  Abnormal TSH - Only mildly abnormal, possibly related to acute illness - free T4 WNL - T3 pending - Abnormal thyoid on exam - will order thyroid US  TOBACCO ABUSE - Nicotine patch    Claudication in peripheral vascular disease - Continue aspirin, plavix, crestor  HTN - BP in the low normal range - Continue to hold amlodipine, lisinopril, hctz  Dispo: Anticipated discharge in approximately 2-3 day(s).   Sid Falcon, MD 12/06/2021, 7:25 AM

## 2021-12-07 ENCOUNTER — Inpatient Hospital Stay (HOSPITAL_COMMUNITY): Payer: No Typology Code available for payment source

## 2021-12-07 DIAGNOSIS — J9601 Acute respiratory failure with hypoxia: Secondary | ICD-10-CM

## 2021-12-07 DIAGNOSIS — Q2112 Patent foramen ovale: Secondary | ICD-10-CM

## 2021-12-07 LAB — BASIC METABOLIC PANEL
Anion gap: 9 (ref 5–15)
BUN: 15 mg/dL (ref 8–23)
CO2: 22 mmol/L (ref 22–32)
Calcium: 8.7 mg/dL — ABNORMAL LOW (ref 8.9–10.3)
Chloride: 107 mmol/L (ref 98–111)
Creatinine, Ser: 0.88 mg/dL (ref 0.61–1.24)
GFR, Estimated: >60 mL/min (ref >60)
Glucose, Bld: 114 mg/dL — ABNORMAL HIGH (ref 70–99)
Potassium: 3.8 mmol/L (ref 3.5–5.1)
Sodium: 138 mmol/L (ref 135–145)

## 2021-12-07 LAB — RESPIRATORY PANEL BY PCR

## 2021-12-07 LAB — BLOOD GAS, ARTERIAL
Acid-Base Excess: 1.4 mmol/L (ref 0.0–2.0)
Bicarbonate: 24.9 mmol/L (ref 20.0–28.0)
Drawn by: 132461
O2 Saturation: 98.5 %
Patient temperature: 37
pCO2 arterial: 35 mmHg (ref 32–48)
pH, Arterial: 7.46 — ABNORMAL HIGH (ref 7.35–7.45)
pO2, Arterial: 96 mmHg (ref 83–108)

## 2021-12-07 LAB — CBC
HCT: 28 % — ABNORMAL LOW (ref 39.0–52.0)
Hemoglobin: 8.5 g/dL — ABNORMAL LOW (ref 13.0–17.0)
MCH: 21 pg — ABNORMAL LOW (ref 26.0–34.0)
MCHC: 30.4 g/dL (ref 30.0–36.0)
MCV: 69.3 fL — ABNORMAL LOW (ref 80.0–100.0)
Platelets: 376 10*3/uL (ref 150–400)
RBC: 4.04 MIL/uL — ABNORMAL LOW (ref 4.22–5.81)
RDW: 19.3 % — ABNORMAL HIGH (ref 11.5–15.5)
WBC: 15.4 10*3/uL — ABNORMAL HIGH (ref 4.0–10.5)
nRBC: 0 % (ref 0.0–0.2)

## 2021-12-07 LAB — SEDIMENTATION RATE: Sed Rate: 9 mm/hr (ref 0–16)

## 2021-12-07 LAB — C-REACTIVE PROTEIN: CRP: 0.7 mg/dL (ref <1.0)

## 2021-12-07 LAB — PROCALCITONIN: Procalcitonin: <0.1 ng/mL

## 2021-12-07 MED ORDER — PREDNISONE 50 MG PO TABS
50.0000 mg | ORAL_TABLET | Freq: Every day | ORAL | Status: DC
  Administered 2021-12-08 – 2021-12-13 (×6): 50 mg via ORAL
  Filled 2021-12-07 (×6): qty 1

## 2021-12-07 MED ORDER — IPRATROPIUM-ALBUTEROL 0.5-2.5 (3) MG/3ML IN SOLN
3.0000 mL | Freq: Four times a day (QID) | RESPIRATORY_TRACT | Status: DC
  Administered 2021-12-08 (×4): 3 mL via RESPIRATORY_TRACT
  Filled 2021-12-07 (×4): qty 3

## 2021-12-07 MED ORDER — NA FERRIC GLUC CPLX IN SUCROSE 12.5 MG/ML IV SOLN: 250.0000 mg | Freq: Once | INTRAVENOUS | Status: DC

## 2021-12-07 MED ORDER — FERROUS SULFATE 325 (65 FE) MG PO TABS
325.0000 mg | ORAL_TABLET | Freq: Every day | ORAL | Status: DC
  Administered 2021-12-08 – 2021-12-15 (×8): 325 mg via ORAL
  Filled 2021-12-07 (×8): qty 1

## 2021-12-07 MED ORDER — METHYLPREDNISOLONE SODIUM SUCC 125 MG IJ SOLR
125.0000 mg | Freq: Once | INTRAMUSCULAR | Status: AC
  Administered 2021-12-07: 125 mg via INTRAVENOUS
  Filled 2021-12-07: qty 2

## 2021-12-07 NOTE — Progress Notes (Signed)
Mobility Specialist Progress Note:   12/07/21 1040  Mobility  Activity Ambulated with assistance in hallway  Level of Assistance Independent after set-up  Assistive Device None  Distance Ambulated (ft) 4 ft  Activity Response Tolerated well  $Mobility charge 1 Mobility   Pt received in bed willing to get up to chair. No complaints of pain. Left in chair with call bell in reach and all needs met.   [facility] L[place]anna [place] Mobility Specialist

## 2021-12-07 NOTE — Progress Notes (Signed)
Notified by CCMD that patient has just had about an 11 beat run of SVT, patient is asymptomatic, I have advised night shift nurse/Messan

## 2021-12-07 NOTE — Progress Notes (Signed)
  Echocardiogram 2D Echocardiogram limited has been performed.  Hector [name] 12/07/2021, 3:41 PM

## 2021-12-07 NOTE — Progress Notes (Addendum)
NAME:  Hector [name], MRN:  [id number], DOB:  [birth year], LOS: 3 ADMISSION DATE:  12/04/2021  Subjective  Patient evaluated at bedside this AM. Feeling okay, still dyspneic but improved from admission. Still having some cough.   Objective   Blood pressure 122/75, pulse 94, temperature 98.2 F (36.8 C), temperature source Oral, resp. rate 18, height 5\' 9"  (1.753 m), weight 74.9 kg, SpO2 93 %.     Intake/Output Summary (Last 24 hours) at 12/07/2021 1204 Last data filed at 12/07/2021 1148 Gross per 24 hour  Intake 1200 ml  Output 3700 ml  Net -2500 ml   Filed Weights   12/05/21 0604 12/06/21 0452 12/07/21 0453  Weight: 75.6 kg 75.3 kg 74.9 kg   Physical Exam: General: Sitting up in bed, appears uncomfortable CV: Regular rate, rhythm. No murmurs. Distal pulses 2+ bilaterally. JVD to mid-neck.  Pulm: Mild increased work of breathing on high flow nasal cannula. Rales appreciated to mid-lung fields bilaterally. No wheezing or rhonchi appreciated. Abdomen: Soft, non-tender, non-distended. MSK: Normal bulk, tone. No peripheral edema appreciated. Skin: Warm, dry. No rashes or lesions. No distal cyanosis appreciated. Mild finger clubbing bilaterally.  Neuro: Awake, alert, conversing appropriately. Grossly non-focal. Psych: Normal mood, affect, speech.  Labs       Latest Ref Rng & Units 12/07/2021    1:00 AM 12/06/2021    1:04 AM 12/05/2021    2:31 PM  CBC  WBC 4.0 - 10.5 K/uL 15.4   14.5     Hemoglobin 13.0 - 17.0 g/dL 8.5   8.2   8.7    Hematocrit 39.0 - 52.0 % 28.0   27.6   29.0    Platelets 150 - 400 K/uL 376   360         Latest Ref Rng & Units 12/07/2021    1:00 AM 12/06/2021    1:04 AM 12/05/2021    2:31 PM  BMP  Glucose 70 - 99 mg/dL 114   130   137    BUN 8 - 23 mg/dL 15   15   16     Creatinine 0.61 - 1.24 mg/dL 0.88   0.98   1.28    Sodium 135 - 145 mmol/L 138   138   136    Potassium 3.5 - 5.1 mmol/L 3.8   4.0   4.0    Chloride 98 - 111 mmol/L 107   109   104    CO2 22 - 32  mmol/L 22   23   21     Calcium 8.9 - 10.3 mg/dL 8.7   8.8   9.0      Summary  Hector [name] is 66yo person with PAD, HTN, HLD, chronic anemia admitted 6/1 with acute hypoxemic respiratory failure with ongoing high-oxygen requirement despite treatment.   Assessment & Plan:  Principal Problem:   Acute hypoxemic respiratory failure (HCC) Active Problems:   TOBACCO ABUSE   Claudication in peripheral vascular disease (HCC)  #Acute hypoxemic respiratory failure #Pulmonary edema Continues to require high flow [place], this morning 20L/hr at FiO2 100%. Etiology of current illness unknown. Appears hypervolemic on exam with rales to bilateral mid-lung fields. However, he has not shown clinical improvement despite adequate diuresis for the last three days. Echo earlier this admission with just mild diastolic dysfunction. Possible causes of non-cardiogenic pulmonary edema include re-expansion injury, high altitude, PE, opioid overdose, TRALI - all which are unlikely. The degree of hypoxia appears to be out of proportion to his  clinical presentation - he appears mildly uncomfortable but not in respiratory distress. In addition, he likely has an underlying degree of obstructive lung disease from chronic tobacco use (no previous PFT's). However, his current presentation is not consistent with COPD exacerbation given continued high-oxygen needs without wheezing on exam (although wheezing was present on admission). Per radiology yesterday, unlikely to be hemorrhagic infiltrates on CT. His anemia appears stable, no signs or symptoms of bleeding appreciated.This could be contributing but doubtful the cause. Inflammatory markers normal. RVP this morning negative. ABG this morning 7.46/35/96 on FiO2 100%. A-a gradient significantly elevated, possibly shunting? Echo earlier this admission did not include bubble study. No history of cirrhosis, less likely hepatopulmonary. CTA on admission negative for PE. CTA did reveal moderate  pulmonary edema with mediastinal and hilar lymphadenopathy, concerning for possible sarcoid vs malignancy? He has had roughly 20lb weight loss recently and describes intermittent night sweats - although no radiographic evidence of this. Could consider sarcoidosis, but with normal inflammatory markers less likely.  Given he continues to be significantly hypoxic after three days of treatment without clear cause, plan to re-image today and consult pulmonology for further recommendations.  - Continue diuresis with IV furosemide 40mg  twice daily - Pending repeat CXR - Pending pulmonology consult, appreciate recommendations - Wean O2 as tolerated - DuoNebs every 4 hours - Needs outpatient PFT's - Smoking cessation counseling  #Iron deficiency anemia Hgb stable, no signs of bleeding on exam. Has received one dose of IV iron, will start oral supplementation today. Will continue to trend blood counts. - Ferrous sulfate 325mg  daily - Daily CBC  #Low TSH Normal free T4, T3. Thyroid ultrasound with tiny nodules in left, none in right - none of which meet criteria for biopsy or follow-up.   #Tobacco use disorder Will continue with nicotine patch, smoking cessation.  #Peripheral vascular disease Stable. Continue home antiplatelets and Crestor.  #Hypertension Normotensive, holding home antihypertensives.  Best practice:  DIET: Regular IVF: n/a DVT PPX: lovenox BOWEL: n/a CODE: FULL FAM COM: n/a  Sanjuan Dame, MD Internal Medicine Resident PGY-2 PAGER: [phone] 12/07/2021 12:04 PM  If after hours (below), please contact on-call pager: [phone] 5PM-7AM Monday-Friday 1PM-7AM Saturday-Sunday

## 2021-12-07 NOTE — Consult Note (Signed)
NAME:  Hector [name], MRN:  [id number], DOB:  [birth year], LOS: 3 ADMISSION DATE:  12/04/2021, CONSULTATION DATE:  12/07/21 REFERRING MD:  Daryll Drown - IMTS , CHIEF COMPLAINT:  Hypoxia  History of Present Illness:   66 yo M PMH HTN, PAD, HLD, etoh use, tobacco use admitted to IMTS 6/1 with CC SOB, cough. RVP, covid and flu a/B are neg. He has had markedly elevated O2 need -- escalating from 6L to NRB to 20L HFNC. CTA chest without evidence of PE but does have a small pleural effusion, some basilar opacities and some enlarged lymph nodes. A TTE was obtained  6/2 -- grade 1 diastolic dysfunction, mild AS.  ABG 6/4- 7.46/35/96/1.4/24.9/98 (on20 L HFNC)  CRP 0.7 ESR 9   Initially on admission, pt was started on 21m PO pred for ? AECOPD. He received this 6/1 and 6/2. Was dc 6/3 with leukocytosis. He has been receiving lasix starting 6/3   PCCM is consulted 6/4 in setting of refractory hypoxemia   In interview w pt, progressive SOB x 7-8 months. + cough for multiple months. Woke up in the middle of the night feeling like he couldn't breathe 5/31 -- 6/1, which is why he ended up coming to the hospital   Pertinent  Medical History  Tobacco dependence Etoh use Emphysema   Significant Hospital Events: Including procedures, antibiotic start and stop dates in addition to other pertinent events   6/1 admit to IMTS. Incr O2 -- 20L HFNC. CTA chest neg for PE. 441mpred.  6/2 cont on pred. ECHO with G1dd. 6/3 steroids stopped. Diuresed 6/4 Pulm consult   Interim History / Subjective:  Remains on 20L.    Objective   Blood pressure 122/75, pulse 94, temperature 98.2 F (36.8 C), temperature source Oral, resp. rate 18, height _0  (1.753 m), weight 74.9 kg, SpO2 93 %.    FiO2 (%):  [100 %] 100 %   Intake/Output Summary (Last 24 hours) at 12/07/2021 1157 Last data filed at 12/07/2021 1148 Gross per 24 hour  Intake 1200 ml  Output 3700 ml  Net -2500 ml   Filed Weights   12/05/21 0604 12/06/21 0452  12/07/21 0453  Weight: 75.6 kg 75.3 kg 74.9 kg    Examination: General: WDWN adult M seated in recliner NAD HENT: NCAT. HFNC in place  Lungs: Symmetrical chest expansion. Even, unlabored. No wheezing, no crackles.  Cardiovascular:  rrr. S1s2 no rgm. Cap refill < 3sec  Abdomen: soft ndnt Extremities: no acute joint deformity.  Neuro: AAOx3 following commands  GU: defer  Resolved Hospital Problem list     Assessment & Plan:   Acute respiratory failure with hypoxia Emphysema (based on CT findings) Tobacco use disorder  -etiology for acute hypoxemia isnt clear. W/u neg for PE. Did have some infiltrates on CT, as well as some enlarged lymph nodes. No reported change in hypoxia when laying down this admission, though not sure these have been "flat" -- at home pt sleeps with multiple pillows under head. ECHO without evidence of PH -? Possible COPD + infiltrates --> AECOPD, but exam right now isn't consistent with AECOPD / would expect some improvement in hypoxemia + some hypercarbia  -? Inflammatory process like sarcoidosis  -? Infectious, but RVP neg, afebrile, and from conversation w pt sounds like SOB has been progressive x months  -? Shunt. Doesn't sound like significant hx hepatic dz  P -sending ACE level -- CRP and ESR are unremarkable, will defer sending broader AI labs  for now  -will order a bubble study.  -cont [place] nebs  -consideration of steroids is somewhat complicated. With clinical suspicion for sarcoid, would be ideal for EBUS before potentially decr size of enlarged nodes, however with current O2 req he would not be a good candidate for such a procedure. Will start steroids.  -Will need outpt follow up, PFTs, repeat imaging  -tobacco cessation    Best Practice (right click and "Reselect all SmartList Selections" daily)    Labs   CBC: Recent Labs  Lab 12/04/21 0810 12/04/21 0820 12/05/21 0543 12/05/21 1431 12/06/21 0104 12/07/21 0100  WBC 11.4*  --  10.0  --   14.5* 15.4*  NEUTROABS 9.3*  --   --   --   --   --   HGB 10.7* 12.6* 8.4* 8.7* 8.2* 8.5*  HCT 35.9* 37.0* 28.2* 29.0* 27.6* 28.0*  MCV 69.0*  --  68.4*  --  69.3* 69.3*  PLT 383  --  335  --  360 409    Basic Metabolic Panel: Recent Labs  Lab 12/04/21 0810 12/04/21 0820 12/05/21 0543 12/05/21 1431 12/06/21 0104 12/07/21 0100  NA 140 140 139 136 138 138  K 3.5 3.4* 3.4* 4.0 4.0 3.8  CL 107  --  106 104 109 107  CO2 22  --  21* 21* 23 22  GLUCOSE 108*  --  155* 137* 130* 114*  BUN 10  --  _0 CREATININE 0.94  --  1.00 1.28* 0.98 0.88  CALCIUM 9.6  --  9.0 9.0 8.8* 8.7*  MG  --   --  1.8  --  2.5*  --    GFR: Estimated Creatinine Clearance: 85.9 mL/min (by C-G formula based on SCr of 0.88 mg/dL). Recent Labs  Lab 12/04/21 0810 12/05/21 0543 12/06/21 0104 12/07/21 0100  WBC 11.4* 10.0 14.5* 15.4*    Liver Function Tests: Recent Labs  Lab 12/04/21 0810 12/05/21 0543  AST 30 24  ALT 19 17  ALKPHOS 81 60  BILITOT 1.4* 0.7  PROT 7.2 6.0*  ALBUMIN 4.0 3.1*   No results for input(s): LIPASE, AMYLASE in the last 168 hours. No results for input(s): AMMONIA in the last 168 hours.  ABG    Component Value Date/Time   PHART 7.46 (H) 12/07/2021 1050   PCO2ART 35 12/07/2021 1050   PO2ART 96 12/07/2021 1050   HCO3 24.9 12/07/2021 1050   TCO2 25 12/04/2021 0820   O2SAT 98.5 12/07/2021 1050     Coagulation Profile: No results for input(s): INR, PROTIME in the last 168 hours.  Cardiac Enzymes: No results for input(s): CKTOTAL, CKMB, CKMBINDEX, TROPONINI in the last 168 hours.  HbA1C: No results found for: HGBA1C  CBG: No results for input(s): GLUCAP in the last 168 hours.  Review of Systems:   Review of Systems  Constitutional: Negative.   HENT: Negative.    Eyes: Negative.   Respiratory:  Positive for cough, sputum production and shortness of breath.   Cardiovascular:  Positive for chest pain and palpitations.  Gastrointestinal: Negative.    Genitourinary: Negative.   Musculoskeletal:  Positive for back pain.  Skin: Negative.   Neurological: Negative.   Endo/Heme/Allergies: Negative.   Psychiatric/Behavioral: Negative.     Past Medical History:  He,  has a past medical history of Arthritis, Claudication in peripheral vascular disease ([place]) (09/05/2018), GERD (gastroesophageal reflux disease), Gout, Hyperlipidemia, and Hypertension.   Surgical History:   Past Surgical History:  Procedure Laterality  Date   ABDOMINAL AORTOGRAM W/LOWER EXTREMITY N/A 09/06/2018   Procedure: ABDOMINAL AORTOGRAM W/LOWER EXTREMITY;  Surgeon: Nigel Mormon, MD;  Location: [facility];  Service: Cardiovascular;  Laterality: N/A;   FACIAL COSMETIC SURGERY     due ot shattered bones    PERIPHERAL VASCULAR BALLOON ANGIOPLASTY  09/06/2018   Procedure: PERIPHERAL VASCULAR BALLOON ANGIOPLASTY;  Surgeon: Nigel Mormon, MD;  Location: [facility];  Service: Cardiovascular;;  LT SFA   right hand fracture     TOTAL KNEE ARTHROPLASTY Left 04/04/2013   Procedure: LEFT TOTAL KNEE ARTHROPLASTY;  Surgeon: Tobi Bastos, MD;  Location: [facility];  Service: Orthopedics;  Laterality: Left;     Social History:   reports that he has been smoking cigarettes. He has a 39.00 pack-year smoking history. He has never used smokeless tobacco. He reports current alcohol use of about 12.0 standard drinks per week. He reports that he does not use drugs.   Family History:  His family history includes Cancer in his mother.   Allergies No Known Allergies   Home Medications  Prior to Admission medications   Medication Sig Start Date End Date Taking? Authorizing Provider  amLODipine (NORVASC) 10 MG tablet Take 10 mg by mouth daily.   Yes [provider]  ASPIRIN LOW DOSE 81 MG EC tablet TAKE 1 TABLET BY MOUTH EVERY DAY 05/23/20  Yes Patwardhan, Manish J, MD  clopidogrel (PLAVIX) 75 MG tablet Take 75 mg by mouth daily.   Yes [provider]  diclofenac Sodium (VOLTAREN) 1 % GEL as needed. 12/23/19  Yes [provider]  etodolac (LODINE) 400 MG tablet Take 400 mg by mouth 2 (two) times daily as needed. 12/23/19  Yes [provider]  lisinopril-hydrochlorothiazide (PRINZIDE,ZESTORETIC) 20-12.5 MG tablet Take 1 tablet by mouth daily.   Yes [provider]  Multiple Vitamins-Minerals (MULTIVITAMIN MEN 50+ PO) Take by mouth daily.   Yes [provider]  omeprazole (PRILOSEC OTC) 20 MG tablet Take 20 mg by mouth daily.   Yes [provider]  ondansetron (ZOFRAN-ODT) 4 MG disintegrating tablet Take 1 tablet (4 mg total) by mouth every 8 (eight) hours as needed for nausea or vomiting. 10/30/21  Yes Myna Bright M, PA-C  rosuvastatin (CRESTOR) 20 MG tablet Take 1 tablet (20 mg total) by mouth at bedtime. 01/05/20 12/04/21 Yes Rex Kras, DO    Eliseo Gum MSN, AGACNP-BC [place] for pager  12/07/2021, 1:03 PM

## 2021-12-08 DIAGNOSIS — J9601 Acute respiratory failure with hypoxia: Secondary | ICD-10-CM | POA: Diagnosis not present

## 2021-12-08 DIAGNOSIS — F17200 Nicotine dependence, unspecified, uncomplicated: Secondary | ICD-10-CM

## 2021-12-08 DIAGNOSIS — R599 Enlarged lymph nodes, unspecified: Secondary | ICD-10-CM

## 2021-12-08 LAB — CBC
HCT: 31 % — ABNORMAL LOW (ref 39.0–52.0)
Hemoglobin: 9.4 g/dL — ABNORMAL LOW (ref 13.0–17.0)
MCH: 21 pg — ABNORMAL LOW (ref 26.0–34.0)
MCHC: 30.3 g/dL (ref 30.0–36.0)
MCV: 69.4 fL — ABNORMAL LOW (ref 80.0–100.0)
Platelets: 404 10*3/uL — ABNORMAL HIGH (ref 150–400)
RBC: 4.47 MIL/uL (ref 4.22–5.81)
RDW: 19.9 % — ABNORMAL HIGH (ref 11.5–15.5)
WBC: 14 10*3/uL — ABNORMAL HIGH (ref 4.0–10.5)
nRBC: 0 % (ref 0.0–0.2)

## 2021-12-08 LAB — BASIC METABOLIC PANEL
Anion gap: 7 (ref 5–15)
BUN: 13 mg/dL (ref 8–23)
CO2: 23 mmol/L (ref 22–32)
Calcium: 9.1 mg/dL (ref 8.9–10.3)
Chloride: 106 mmol/L (ref 98–111)
Creatinine, Ser: 0.98 mg/dL (ref 0.61–1.24)
GFR, Estimated: >60 mL/min (ref >60)
Glucose, Bld: 130 mg/dL — ABNORMAL HIGH (ref 70–99)
Potassium: 4.6 mmol/L (ref 3.5–5.1)
Sodium: 136 mmol/L (ref 135–145)

## 2021-12-08 LAB — STREP PNEUMONIAE URINARY ANTIGEN: Strep Pneumo Urinary Antigen: NEGATIVE

## 2021-12-08 MED ORDER — IPRATROPIUM-ALBUTEROL 0.5-2.5 (3) MG/3ML IN SOLN
3.0000 mL | Freq: Three times a day (TID) | RESPIRATORY_TRACT | Status: DC
  Administered 2021-12-09 – 2021-12-10 (×4): 3 mL via RESPIRATORY_TRACT
  Filled 2021-12-08 (×5): qty 3

## 2021-12-08 MED ORDER — IPRATROPIUM-ALBUTEROL 0.5-2.5 (3) MG/3ML IN SOLN
3.0000 mL | Freq: Four times a day (QID) | RESPIRATORY_TRACT | Status: DC | PRN
Start: 2021-12-08 — End: 2021-12-16

## 2021-12-08 NOTE — TOC Progression Note (Signed)
Transition of Care [facility]) - Progression Note    Patient Details  Name: Hector [name] MRN: [id number] Date of Birth: [birth year]  Transition of Care [facility]) CM/SW [place], RN Phone Number:[phone]  12/08/2021, 3:17 PM  Clinical Narrative:    TOC continues to follow. Patient currently on HFNC 20L FiO2 100% Diuresing on IV lasix. Will need bronchoscopy when O2 demands decrease and can tolerate. No TOC needs identified at this time. TOC will continue to follow.         Expected Discharge Plan and Services                                                 Social Determinants of Health (SDOH) Interventions    Readmission Risk Interventions     View : No data to display.

## 2021-12-08 NOTE — Progress Notes (Signed)
NAME:  Hector [name], MRN:  [id number], DOB:  03-10-1956, LOS: 4 ADMISSION DATE:  12/04/2021, CONSULTATION DATE:  12/07/21 REFERRING MD:  Daryll Drown - IMTS , CHIEF COMPLAINT:  Hypoxia  History of Present Illness:   66 yo M PMH HTN, PAD, HLD, etoh use, tobacco use admitted to IMTS 6/1 with CC SOB, cough. RVP, covid and flu a/B are neg. He has had markedly elevated O2 need -- escalating from 6L to NRB to 20L HFNC. CTA chest without evidence of PE but does have a small pleural effusion, some basilar opacities and some enlarged lymph nodes. A TTE was obtained  6/2 -- grade 1 diastolic dysfunction, mild AS.  ABG 6/4- 7.46/35/96/1.4/24.9/98 (on20 L HFNC)  CRP 0.7 ESR 9   Initially on admission, pt was started on $RemoveBe'40mg'KiaquqbOm$  PO pred for ? AECOPD. He received this 6/1 and 6/2. Was dc 6/3 with leukocytosis. He has been receiving lasix starting 6/3   PCCM is consulted 6/4 in setting of refractory hypoxemia   In interview w pt, progressive SOB x 7-8 months. + cough for multiple months. Woke up in the middle of the night feeling like he couldn't breathe 5/31 -- 6/1, which is why he ended up coming to the hospital   Pertinent  Medical History  Tobacco dependence Etoh use Emphysema   Significant Hospital Events: Including procedures, antibiotic start and stop dates in addition to other pertinent events   6/1 admit to IMTS. Incr O2 -- 20L HFNC. CTA chest neg for PE. $Remov'40mg'krLgcg$  pred.  6/2 cont on pred. ECHO with G1dd. 6/3 steroids stopped. Diuresed 6/4 Pulm consult   Interim History / Subjective:  Remains on 20L with sats of 96 at rest. RR of 23. Steroids were started 6/4 Pt. States breathing may be a little better.  He is in NAD when speaking, is not anxious   Objective   Blood pressure 114/66, pulse 91, temperature 98.6 F (37 C), temperature source Oral, resp. rate (!) 23, height $RemoveBe'5\' 9"'NTgOrXTww$  (1.753 m), weight 72.3 kg, SpO2 95 %.    FiO2 (%):  [100 %] 100 %   Intake/Output Summary (Last 24 hours) at 12/08/2021  0828 Last data filed at 12/08/2021 0500 Gross per 24 hour  Intake 600 ml  Output 3425 ml  Net -2825 ml    Filed Weights   12/06/21 0452 12/07/21 0453 12/08/21 0534  Weight: 75.3 kg 74.9 kg 72.3 kg    Examination: General: WDWN adult M supine in bed watching tv. He is in NAD HENT: NCAT. HFNC in place  Lungs: Bilateral chest excursion Even, unlabored. No wheezing with few crackles per posterior bases Cardiovascular:  rrr. S1s2 no rgm. Cap refill < 3sec . Tele NSR with occasional PAC Abdomen: soft ndnt. Body mass index is 23.54 kg/m.  Extremities: no acute joint deformity. No edema  Neuro: AAOx3 following commands , appropriate GU: defer  Labs : ACE 6/4 >> In process PCT < 0.10 CRP >> 0.7 Sed Rate >> 9  Strep Pneumo Negative Legionella in process Net negative 9 L, 3400 cc of Urine output last 24 hours  WBC 14, HGB 9.4, Platelets 404 Eosinophils 34.1  Neutrophils Absolute 9.3 Na 136/ K 4.6/ Creatinine 0.98/ CO2 23  T Max 98.3   Resolved Hospital Problem list     Assessment & Plan:   Acute respiratory failure with hypoxia Emphysema (based on CT findings) Tobacco use disorder  -etiology for acute hypoxemia isnt clear. W/u neg for PE. Did have some infiltrates on  CT, as well as some enlarged lymph nodes. No reported change in hypoxia when laying down this admission, though not sure these have been "flat" -- at home pt sleeps with multiple pillows under head. ECHO without evidence of PH -? Possible COPD + infiltrates --> AECOPD, but exam right now isn't consistent with AECOPD / would expect some improvement in hypoxemia + some hypercarbia  -? Inflammatory process like sarcoidosis>> ACE is pending on 6/5, CRP and ESR are normal  -? Infectious, but RVP neg, PCT within normal limits, afebrile, and from conversation w pt sounds like SOB has been progressive x 7+ months  - Bubble Study was negative for any interatrial shunt, there was bowing of the interatrial septum, suggestive  of  elevated right atrial pressure. -sending ACE level -- pending 6/5 -cont [place] nebs  - Continue Steroids as ordered . Will need Bronch EBUS to evaluate mediastinal adenopathy once oxygen demand is  less, and can better tolerate procedure - ? Evaluate  PAP as PA was not well visualized on echo to RO Pulmonary HTN, can consider R Heart cath if all other work up  is negative - Will need outpt follow up, PFTs, repeat imaging  - tobacco cessation >> patient qualifies for lung cancer screening  - Upon DC have patient call [phone] QUIT NOW for free nicotine replacement therapy    Best Practice (right click and "Reselect all SmartList Selections" daily)    Labs   CBC: Recent Labs  Lab 12/04/21 0810 12/04/21 0820 12/05/21 0543 12/05/21 1431 12/06/21 0104 12/07/21 0100 12/08/21 0053  WBC 11.4*  --  10.0  --  14.5* 15.4* 14.0*  NEUTROABS 9.3*  --   --   --   --   --   --   HGB 10.7*   < > 8.4* 8.7* 8.2* 8.5* 9.4*  HCT 35.9*   < > 28.2* 29.0* 27.6* 28.0* 31.0*  MCV 69.0*  --  68.4*  --  69.3* 69.3* 69.4*  PLT 383  --  335  --  360 376 404*   < > = values in this interval not displayed.     Basic Metabolic Panel: Recent Labs  Lab 12/05/21 0543 12/05/21 1431 12/06/21 0104 12/07/21 0100 12/08/21 0053  NA 139 136 138 138 136  K 3.4* 4.0 4.0 3.8 4.6  CL 106 104 109 107 106  CO2 21* 21* $Remov'23 22 23  'MMLelN$ GLUCOSE 155* 137* 130* 114* 130*  BUN $Re'15 16 15 15 13  'cMN$ CREATININE 1.00 1.28* 0.98 0.88 0.98  CALCIUM 9.0 9.0 8.8* 8.7* 9.1  MG 1.8  --  2.5*  --   --     GFR: Estimated Creatinine Clearance: 77.2 mL/min (by C-G formula based on SCr of 0.98 mg/dL). Recent Labs  Lab 12/05/21 0543 12/06/21 0104 12/07/21 0100 12/07/21 1422 12/08/21 0053  PROCALCITON  --   --   --  <0.10  --   WBC 10.0 14.5* 15.4*  --  14.0*     Liver Function Tests: Recent Labs  Lab 12/04/21 0810 12/05/21 0543  AST 30 24  ALT 19 17  ALKPHOS 81 60  BILITOT 1.4* 0.7  PROT 7.2 6.0*  ALBUMIN 4.0 3.1*     No results for input(s): LIPASE, AMYLASE in the last 168 hours. No results for input(s): AMMONIA in the last 168 hours.  ABG    Component Value Date/Time   PHART 7.46 (H) 12/07/2021 1050   PCO2ART 35 12/07/2021 1050   PO2ART 96 12/07/2021  1050   HCO3 24.9 12/07/2021 1050   TCO2 25 12/04/2021 0820   O2SAT 98.5 12/07/2021 1050      Coagulation Profile: No results for input(s): INR, PROTIME in the last 168 hours.  Cardiac Enzymes: No results for input(s): CKTOTAL, CKMB, CKMBINDEX, TROPONINI in the last 168 hours.  HbA1C: No results found for: HGBA1C  CBG: No results for input(s): GLUCAP in the last 168 hours.  Allergies No Known Allergies   Home Medications  Prior to Admission medications   Medication Sig Start Date End Date Taking? Authorizing Provider  amLODipine (NORVASC) 10 MG tablet Take 10 mg by mouth daily.   Yes [provider]  ASPIRIN LOW DOSE 81 MG EC tablet TAKE 1 TABLET BY MOUTH EVERY DAY 05/23/20  Yes Patwardhan, Manish J, MD  clopidogrel (PLAVIX) 75 MG tablet Take 75 mg by mouth daily.   Yes [provider]  diclofenac Sodium (VOLTAREN) 1 % GEL as needed. 12/23/19  Yes [provider]  etodolac (LODINE) 400 MG tablet Take 400 mg by mouth 2 (two) times daily as needed. 12/23/19  Yes [provider]  lisinopril-hydrochlorothiazide (PRINZIDE,ZESTORETIC) 20-12.5 MG tablet Take 1 tablet by mouth daily.   Yes [provider]  Multiple Vitamins-Minerals (MULTIVITAMIN MEN 50+ PO) Take by mouth daily.   Yes [provider]  omeprazole (PRILOSEC OTC) 20 MG tablet Take 20 mg by mouth daily.   Yes [provider]  ondansetron (ZOFRAN-ODT) 4 MG disintegrating tablet Take 1 tablet (4 mg total) by mouth every 8 (eight) hours as needed for nausea or vomiting. 10/30/21  Yes Myna Bright M, PA-C  rosuvastatin (CRESTOR) 20 MG tablet Take 1 tablet (20 mg total) by mouth at bedtime. 01/05/20 12/04/21 Yes Rex Kras,  DO  Magdalen Spatz, MSN, AGACNP-BC [place] for personal pager PCCM on call pager [phone]  12/08/2021, 8:28 AM

## 2021-12-08 NOTE — Plan of Care (Signed)
  Problem: Clinical Measurements: Goal: Cardiovascular complication will be avoided Outcome: Progressing   Problem: Activity: Goal: Risk for activity intolerance will decrease Outcome: Progressing   Problem: Nutrition: Goal: Adequate nutrition will be maintained Outcome: Progressing   Problem: Coping: Goal: Level of anxiety will decrease Outcome: Progressing   Problem: Elimination: Goal: Will not experience complications related to urinary retention Outcome: Progressing   Problem: Pain Managment: Goal: General experience of comfort will improve Outcome: Progressing   

## 2021-12-08 NOTE — Plan of Care (Signed)

## 2021-12-08 NOTE — Progress Notes (Signed)
HD#4 SUBJECTIVE:  Patient Summary: Hector [name] is a 66 y.o. male with a history of HTN and PAD presenting with worsening cough and shortness of breath, admitted for acute hypoxemic respiratory failure.    Overnight Events: None  Interim History: Patient evaluated at bedside where he appeared comfortable in bed, normal work of breathing on HFNC. He states that he may feel a little better this morning compared to yesterday morning. He continues to have coughing spells throughout each day. He is eager to go home however our team explained to him that we want patients on less than 4L supplemental oxygen before they go home, and that he is on 20L. He does not think that he had any exposure to burn pits or other chemicals during his time in the [employer], but isn't completely sure.  OBJECTIVE:  Vital Signs: Vitals:   12/07/21 2047 12/08/21 0027 12/08/21 0301 12/08/21 0534  BP:  (!) 103/53  113/75  Pulse:  78  86  Resp:  20  20  Temp:  98.2 F (36.8 C)  98.2 F (36.8 C)  TempSrc:  Oral  Oral  SpO2: 97% 96% 98% 96%  Weight:    72.3 kg  Height:       Supplemental O2: HFNC SpO2: 96 % O2 Flow Rate (L/min): 20 L/min FiO2 (%): 100 %  Filed Weights   12/06/21 0452 12/07/21 0453 12/08/21 0534  Weight: 75.3 kg 74.9 kg 72.3 kg     Intake/Output Summary (Last 24 hours) at 12/08/2021 0617 Last data filed at 12/08/2021 0500 Gross per 24 hour  Intake 840 ml  Output 3425 ml  Net -2585 ml   Net IO Since Admission: -9,083.81 mL [12/08/21 0617]  Physical Exam: Constitutional:Resting comfortably in bed, in no acute distress. Cardio:Regular rate and rhythm. No murmurs, rubs, gallops. Pulm:Decreased air movement diffusely. Mild expiratory wheezing over LUL. ATF:TDDUKGUR for extremity edema. Skin:Warm and dry. Neuro:Alert and oriented x3. No focal deficit noted. Psych:Pleasant mood and affect.  Patient Lines/Drains/Airways Status     Active Line/Drains/Airways     Name Placement date  Placement time Site Days   Peripheral IV 12/04/21 20 G Left Antecubital 12/04/21  0810  Antecubital  4             ASSESSMENT/PLAN:  Assessment: Principal Problem:   Acute hypoxemic respiratory failure (HCC) Active Problems:   TOBACCO ABUSE   Claudication in peripheral vascular disease (HCC)   Plan: #Acute hypoxemic respiratory failure #Pulmonary edema Patient continues to require high levels of oxygen supplementation, currently on 20L HFNC to maintain appropriate saturations. He was started on steroids yesterday and reports that he may feel a little better overnight. He is net -9+ L since admission. Echo bubble study 06/04 showed L bowing of intraatrial septum suggesting elevated RA pressure, with overall negative study. Repeat CXR 06/04 showed significant improvement of mild opacity in the L base, favored to represent atelectasis, with no other acute abnormalities. -PCCM consulted, appreciate their assistance  -Prednisone 50 mg daily  -Angiotensin-converting enzyme level pending  -Will need bronch EBUS once oxygen requirements have improved  -Consider R heart cath if remainder of work-up is negative -ANA with reflex, ANCA labs ordered to assess for autoimmune cause -Continue diuresis with IV furosemide 40mg  twice daily -Wean O2 as tolerated -DuoNebs every 4 hours -He will need outpatient PFT's -Smoking cessation counseling   #Iron deficiency anemia Hgb stable, 9.4 today. -Continue ferrous sulfate 325mg  daily -Trend CBC   #Tobacco use disorder Continue nicotine  patch, encouragement for smoking cessation.   #Peripheral vascular disease Continue home antiplatelets and Crestor.   #Hypertension Normotensive at this time, 118/70. -Continue to hold home antihypertensives  Best Practice: Diet: Regular diet IVF: None VTE: enoxaparin (LOVENOX) injection 40 mg Start: 12/04/21 2200 Code: Full AB: None DISPO: Anticipated discharge pending Medical stability and New  oxygen.  Signature: Champ Mungo, D.O.  Internal Medicine Resident, PGY-1 [facility]  Pager: [phone] 6:17 AM, 12/08/2021   Please contact the on call pager after 5 pm and on weekends at [phone].

## 2021-12-08 NOTE — Progress Notes (Signed)
Mobility Specialist Progress Note    12/08/21 1129  Mobility  Activity Transferred from bed to chair  Level of Assistance Standby assist, set-up cues, supervision of patient - no hands on  Assistive Device None  Activity Response Tolerated well  $Mobility charge 1 Mobility   Pre-Mobility: 76 HR, 91% SpO2  Left with call bell in reach. RN notified.   Hildred Alamin Mobility Specialist

## 2021-12-09 ENCOUNTER — Inpatient Hospital Stay (HOSPITAL_COMMUNITY): Payer: No Typology Code available for payment source

## 2021-12-09 DIAGNOSIS — J9601 Acute respiratory failure with hypoxia: Secondary | ICD-10-CM | POA: Diagnosis not present

## 2021-12-09 LAB — CBC
HCT: 32.3 % — ABNORMAL LOW (ref 39.0–52.0)
Hemoglobin: 9.8 g/dL — ABNORMAL LOW (ref 13.0–17.0)
MCH: 21.2 pg — ABNORMAL LOW (ref 26.0–34.0)
MCHC: 30.3 g/dL (ref 30.0–36.0)
MCV: 69.9 fL — ABNORMAL LOW (ref 80.0–100.0)
Platelets: 436 10*3/uL — ABNORMAL HIGH (ref 150–400)
RBC: 4.62 MIL/uL (ref 4.22–5.81)
RDW: 20.5 % — ABNORMAL HIGH (ref 11.5–15.5)
WBC: 18.2 10*3/uL — ABNORMAL HIGH (ref 4.0–10.5)
nRBC: 0 % (ref 0.0–0.2)

## 2021-12-09 LAB — ANCA TITERS
Atypical P-ANCA titer: <1:20 {titer}
C-ANCA: <1:20 {titer}
P-ANCA: <1:20 {titer}

## 2021-12-09 LAB — BASIC METABOLIC PANEL
Anion gap: 9 (ref 5–15)
BUN: 23 mg/dL (ref 8–23)
CO2: 23 mmol/L (ref 22–32)
Calcium: 8.9 mg/dL (ref 8.9–10.3)
Chloride: 105 mmol/L (ref 98–111)
Creatinine, Ser: 1.1 mg/dL (ref 0.61–1.24)
GFR, Estimated: >60 mL/min (ref >60)
Glucose, Bld: 136 mg/dL — ABNORMAL HIGH (ref 70–99)
Potassium: 4.2 mmol/L (ref 3.5–5.1)
Sodium: 137 mmol/L (ref 135–145)

## 2021-12-09 LAB — ANGIOTENSIN CONVERTING ENZYME: Angiotensin-Converting Enzyme: 21 U/L (ref 14–82)

## 2021-12-09 LAB — LEGIONELLA PNEUMOPHILA SEROGP 1 UR AG: L. pneumophila Serogp 1 Ur Ag: NEGATIVE

## 2021-12-09 LAB — BRAIN NATRIURETIC PEPTIDE: B Natriuretic Peptide: 218.1 pg/mL — ABNORMAL HIGH (ref 0.0–100.0)

## 2021-12-09 MED ORDER — DILTIAZEM HCL-DEXTROSE 125-5 MG/125ML-% IV SOLN (PREMIX)
5.0000 mg/h | INTRAVENOUS | Status: DC
  Administered 2021-12-09: 5 mg/h via INTRAVENOUS
  Filled 2021-12-09: qty 125

## 2021-12-09 MED ORDER — POLYETHYLENE GLYCOL 3350 17 G PO PACK
17.0000 g | PACK | Freq: Every day | ORAL | Status: DC
  Administered 2021-12-09 – 2021-12-15 (×5): 17 g via ORAL
  Filled 2021-12-09 (×6): qty 1

## 2021-12-09 MED ORDER — DILTIAZEM LOAD VIA INFUSION
15.0000 mg | Freq: Once | INTRAVENOUS | Status: AC
  Administered 2021-12-09: 15 mg via INTRAVENOUS
  Filled 2021-12-09: qty 15

## 2021-12-09 NOTE — Plan of Care (Signed)
  Problem: Nutrition: Goal: Adequate nutrition will be maintained Outcome: Progressing   Problem: Coping: Goal: Level of anxiety will decrease Outcome: Progressing   Problem: Elimination: Goal: Will not experience complications related to bowel motility Outcome: Progressing Goal: Will not experience complications related to urinary retention Outcome: Progressing   Problem: Pain Managment: Goal: General experience of comfort will improve Outcome: Progressing   

## 2021-12-09 NOTE — Progress Notes (Signed)
NAME:  Hector [name], MRN:  [id number], DOB:  [birth year], LOS: 5 ADMISSION DATE:  12/04/2021, CONSULTATION DATE:  12/07/21 REFERRING MD:  Daryll Drown - IMTS , CHIEF COMPLAINT:  Hypoxia  History of Present Illness:   66 yo M PMH HTN, PAD, HLD, etoh use, tobacco use admitted to [facility]MT[facility] 6/1 with CC SOB, cough. RVP, covid and flu a/B are neg. He has had markedly elevated O2 need -- escalating from 6L to NRB to 20L HFNC. CTA chest without evidence of PE but does have a small pleural effusion, some basilar opacities and some enlarged lymph nodes. A TTE was obtained  6/2 -- grade 1 diastolic dysfunction, mild AS.  ABG 6/4- 7.46/35/96/1.4/24.9/98 (on20 L HFNC)  CRP 0.7 ESR 9   Initially on admission, pt was started on $RemoveBe'40mg'bsWSmlrvX$  PO pred for ? AECOPD. He received this 6/1 and 6/2. Was dc 6/3 with leukocytosis. He has been receiving lasix starting 6/3   PCCM is consulted 6/4 in setting of refractory hypoxemia   In interview w pt, progressive SOB x 7-8 months. + cough for multiple months. Woke up in the middle of the night feeling like he couldn't breathe 5/31 -- 6/1, which is why he ended up coming to the hospital   Pertinent  Medical History  Tobacco dependence Etoh use Emphysema   Significant Hospital Events: Including procedures, antibiotic start and stop dates in addition to other pertinent events   6/1 admit to IMT[facility]. Incr O2 -- 20L HFNC. CTA chest neg for PE. $Remov'40mg'KZCSbo$  pred.  6/2 cont on pred. ECHO with G1dd. 6/3 steroids stopped. Diuresed 6/4 Pulm consult   Interim History / Subjective:  On10 L satting mid 90s. Turned down to 8L. Sats unchanged. Net negative on I/O but weight up 1kg.   Objective   Blood pressure 114/68, pulse 76, temperature 98.7 F (37.1 C), temperature source Oral, resp. rate 19, height $RemoveBe'5\' 9"'mIJUsSvii$  (1.753 m), weight 74.6 kg, SpO2 92 %.        Intake/Output Summary (Last 24 hours) at 12/09/2021 1038 Last data filed at 12/09/2021 0700 Gross per 24 hour  Intake 609 ml  Output 2200 ml  Net  -1591 ml    Filed Weights   12/07/21 0453 12/08/21 0534 12/09/21 0432  Weight: 74.9 kg 72.3 kg 74.6 kg    Examination: General: lying in bed, in NAD HENT: NCAT. HFNC in place  Lungs: Bilateral chest excursion Even, unlabored. No wheezing with few crackles per posterior bases Cardiovascular:  rrr. S1s2 no rgm. Cap refill < 3sec . Tele NSR with occasional PAC Abdomen: soft ndnt. Extremities: no acute joint deformity. No edema  Neuro: AAOx3 following commands , appropriate  Labs : ACE 6/4 >> In process PCT < 0.10 CRP normal Sed Rate normal Strep Pneumo Negative Legionella in process     Resolved Hospital Problem list     Assessment & Plan:   Acute respiratory failure with hypoxia Emphysema (based on CT findings) Volume overload Tobacco use disorder  Etiology of hypoxemia is likely multifactorial. Has emphysema. Subtle findings of likely pulmonary edema it interlobular septal thickening and bilateral pleura effusions. Suspect shunt physiology. No evidence of pneumonia. Inflammatory markers negative/WNL. --plan to wean steroids --Continue aggressive diuresis as kidney function and BP allows --Wean O2 for saturation goal >88%   Best Practice (right click and "Reselect all SmartList Selections" daily)   Per primary  Labs   CBC: Recent Labs  Lab 12/04/21 0810 12/04/21 0820 12/05/21 0543 12/05/21 1431 12/06/21 0104 12/07/21 0100  12/08/21 0053 12/09/21 0139  WBC 11.4*  --  10.0  --  14.5* 15.4* 14.0* 18.2*  NEUTROABS 9.3*  --   --   --   --   --   --   --   HGB 10.7*   < > 8.4* 8.7* 8.2* 8.5* 9.4* 9.8*  HCT 35.9*   < > 28.2* 29.0* 27.6* 28.0* 31.0* 32.3*  MCV 69.0*  --  68.4*  --  69.3* 69.3* 69.4* 69.9*  PLT 383  --  335  --  360 376 404* 436*   < > = values in this interval not displayed.     Basic Metabolic Panel: Recent Labs  Lab 12/05/21 0543 12/05/21 1431 12/06/21 0104 12/07/21 0100 12/08/21 0053 12/09/21 0139  NA 139 136 138 138 136 137  K  3.4* 4.0 4.0 3.8 4.6 4.2  CL 106 104 109 107 106 105  CO2 21* 21* $Remov'23 22 23 23  'UFhAih$ GLUCOSE 155* 137* 130* 114* 130* 136*  BUN $Re'15 16 15 15 13 23  'emE$ CREATININE 1.00 1.28* 0.98 0.88 0.98 1.10  CALCIUM 9.0 9.0 8.8* 8.7* 9.1 8.9  MG 1.8  --  2.5*  --   --   --     GFR: Estimated Creatinine Clearance: 68.7 mL/min (by C-G formula based on SCr of 1.1 mg/dL). Recent Labs  Lab 12/06/21 0104 12/07/21 0100 12/07/21 1422 12/08/21 0053 12/09/21 0139  PROCALCITON  --   --  <0.10  --   --   WBC 14.5* 15.4*  --  14.0* 18.2*     Liver Function Tests: Recent Labs  Lab 12/04/21 0810 12/05/21 0543  AST 30 24  ALT 19 17  ALKPHOS 81 60  BILITOT 1.4* 0.7  PROT 7.2 6.0*  ALBUMIN 4.0 3.1*    No results for input(s): LIPASE, AMYLASE in the last 168 hours. No results for input(s): AMMONIA in the last 168 hours.  ABG    Component Value Date/Time   PHART 7.46 (H) 12/07/2021 1050   PCO2ART 35 12/07/2021 1050   PO2ART 96 12/07/2021 1050   HCO3 24.9 12/07/2021 1050   TCO2 25 12/04/2021 0820   O2SAT 98.5 12/07/2021 1050      Coagulation Profile: No results for input(s): INR, PROTIME in the last 168 hours.  Cardiac Enzymes: No results for input(s): CKTOTAL, CKMB, CKMBINDEX, TROPONINI in the last 168 hours.  HbA1C: No results found for: HGBA1C  CBG: No results for input(s): GLUCAP in the last 168 hours.  Allergies No Known Allergies   Home Medications  Prior to Admission medications   Medication Sig Start Date End Date Taking? Authorizing Provider  amLODipine (NORVASC) 10 MG tablet Take 10 mg by mouth daily.   Yes [provider]  ASPIRIN LOW DOSE 81 MG EC tablet TAKE 1 TABLET BY MOUTH EVERY DAY 05/23/20  Yes Patwardhan, Manish J, MD  clopidogrel (PLAVIX) 75 MG tablet Take 75 mg by mouth daily.   Yes [provider]  diclofenac Sodium (VOLTAREN) 1 % GEL as needed. 12/23/19  Yes [provider]  etodolac (LODINE) 400 MG tablet Take 400 mg by mouth 2 (two)  times daily as needed. 12/23/19  Yes [provider]  lisinopril-hydrochlorothiazide (PRINZIDE,ZESTORETIC) 20-12.5 MG tablet Take 1 tablet by mouth daily.   Yes [provider]  Multiple Vitamins-Minerals (MULTIVITAMIN MEN 50+ PO) Take by mouth daily.   Yes [provider]  omeprazole (PRILOSEC OTC) 20 MG tablet Take 20 mg by mouth daily.  Yes [provider]  ondansetron (ZOFRAN-ODT) 4 MG disintegrating tablet Take 1 tablet (4 mg total) by mouth every 8 (eight) hours as needed for nausea or vomiting. 10/30/21  Yes Myna Bright M, PA-C  rosuvastatin (CRESTOR) 20 MG tablet Take 1 tablet (20 mg total) by mouth at bedtime. 01/05/20 12/04/21 Yes Rex Kras, DO  Lanier Clam, MD [place] for contact info PCCM on call pager [phone]  12/09/2021, 10:38 AM

## 2021-12-09 NOTE — Progress Notes (Signed)
Pt is presenting with Afib with a HR of 143 and BP of 124/71. Spoke with internal med. Provider, awaiting orders.

## 2021-12-09 NOTE — Progress Notes (Addendum)
HD#5 SUBJECTIVE:  Patient Summary: Hector [name] is a 66 y.o. male with a history of HTN and PAD presenting with worsening cough and shortness of breath, admitted for acute hypoxemic respiratory failure.  Overnight Events: None.  Interim History: Patient resting comfortably in bed this morning. He was weaned from 12 L HFNC to 8 L HFNC by pulmonology to see how he tolerated continued down-titration, which he was largely tolerating well with SpO2 in the low-mid 90's. After moving around for physical exam he did desaturate to mid-80's on 8 L HFNC and was bumped up to 9 L HFNC with appropriate improvement of oxygenation. He continues to endorse feeling eager to go home but feels okay today. He has not had a bowel movement in several days and is requesting assistance in having a bowel movement.  OBJECTIVE:  Vital Signs: Vitals:   12/08/21 2350 12/09/21 0432 12/09/21 0755 12/09/21 0810  BP: 101/61 122/68  114/68  Pulse: 79 86 79 76  Resp: 18 20 (!) 25 19  Temp: 98.2 F (36.8 C) 98.7 F (37.1 C)  98.7 F (37.1 C)  TempSrc: Oral Oral  Oral  SpO2: 95% 92% 93% 92%  Weight:  74.6 kg    Height:       Supplemental O2: HFNC SpO2: 92 % O2 Flow Rate (L/min): 12 L/min FiO2 (%): 100 %  Filed Weights   12/07/21 0453 12/08/21 0534 12/09/21 0432  Weight: 74.9 kg 72.3 kg 74.6 kg     Intake/Output Summary (Last 24 hours) at 12/09/2021 1038 Last data filed at 12/09/2021 0700 Gross per 24 hour  Intake 609 ml  Output 2200 ml  Net -1591 ml   Net IO Since Admission: -10,674.81 mL [12/09/21 1038]  Physical Exam: Constitutional:Chronically ill appearing gentleman resting comfortably in bed, no acute distress. Cardio:Regular rate and rhythm. No murmurs, rubs, gallops. Pulm:Bibasilar crackles. No accessory muscle use. Comfortable on 9 L HFNC. QF:[id number] for extremity edema. Skin:Warm and dry. Neuro:Alert and oriented x3. No focal deficit noted. Psych:Pleasant mood and affect.  Patient  Lines/Drains/Airways Status     Active Line/Drains/Airways     Name Placement date Placement time Site Days   Peripheral IV 12/04/21 20 G Left Antecubital 12/04/21  0810  Antecubital  5             ASSESSMENT/PLAN:  Assessment: Principal Problem:   Acute hypoxemic respiratory failure (HCC) Active Problems:   TOBACCO ABUSE   Claudication in peripheral vascular disease ([place])   Plan: #Acute hypoxemic respiratory failure #Pulmonary edema Patient has shown improvement of O2 requirements, now satting mid-90's on 9L HFNC which has been gradually titrated down from 20 L HFNC over the last day. He is net -11L this admission. Current management includes diuresis, steroids, and duoneb treatment. He did have a bump in leukocytosis however given steroid therapy this can be seen. -PCCM consulted, appreciate their assistance             -Prednisone 50 mg daily             -Angiotensin-converting enzyme level pending             -Will need bronch EBUS once oxygen requirements have improved             -Consider R heart cath if remainder of work-up is negative -ANA with reflex, ANCA labs ordered to assess for autoimmune cause -High resolution CT chest ordered -Continue diuresis with IV furosemide 40mg  twice daily -Wean O2 as tolerated -DuoNebs every  4 hours -Trend WBC -He will need outpatient PFT's -Smoking cessation counseling   #Iron deficiency anemia Hgb stable, 9.8 today. -Continue ferrous sulfate 325mg  daily -Trend CBC   #Tobacco use disorder Continue nicotine patch, encouragement for smoking cessation.   #Peripheral vascular disease Continue home antiplatelets and Crestor.   #Hypertension Normotensive at this time, 118/70. -Continue to hold home antihypertensives  Best Practice: Diet: Regular diet IVF: None VTE: enoxaparin (LOVENOX) injection 40 mg Start: 12/04/21 2200 Code: Full AB: None DISPO: Anticipated discharge pending Medical stability and New  oxygen.  Signature: Farrel Gordon, D.O.  Internal Medicine Resident, PGY-1 [facility]  Pager: [phone] 10:38 AM, 12/09/2021   Please contact the on call pager after 5 pm and on weekends at [phone].

## 2021-12-09 NOTE — Progress Notes (Signed)
Received a page that patient went into afib with RVR with rates in the 140s. Went to bedside to assess. On telemetry he appears to have gone into afib around 9:50PM. No prior history in the chart of afib.  Subjective: Patient says that he feels fine, denies any worsening shortness of breath or feeling like his heart is racing. No chest pain. Resting comfortably.  Vitals: Hemodynamically stable on 9L HFNC, HR rates in the 140s Exam: Middle aged man resting comfortably in bed CV: irregularly irregular, tachycardic, no m/r/g Resp: Normal WOB on 9 L HF Neuro: Alert, oriented, answering questions appropriately  A/P: unclear why patient went into afib. Asymptomatic at this time. Will start cardizem drip. Avoiding BB given lung disease. Will also order labs. - f/u CBC, BMP, Mag - cardizem drip  Addendum: Patient placed on cardizem drip and subsequently converted back to NSR. Of note CHADSVASC score is 3. No electrolyte abnormalities seen on BMP. WBC down from prior check.

## 2021-12-10 DIAGNOSIS — J9601 Acute respiratory failure with hypoxia: Secondary | ICD-10-CM | POA: Diagnosis not present

## 2021-12-10 LAB — CBC
HCT: 34.3 % — ABNORMAL LOW (ref 39.0–52.0)
Hemoglobin: 10.4 g/dL — ABNORMAL LOW (ref 13.0–17.0)
MCH: 21.4 pg — ABNORMAL LOW (ref 26.0–34.0)
MCHC: 30.3 g/dL (ref 30.0–36.0)
MCV: 70.6 fL — ABNORMAL LOW (ref 80.0–100.0)
Platelets: 439 10*3/uL — ABNORMAL HIGH (ref 150–400)
RBC: 4.86 MIL/uL (ref 4.22–5.81)
RDW: 21.5 % — ABNORMAL HIGH (ref 11.5–15.5)
WBC: 16.9 10*3/uL — ABNORMAL HIGH (ref 4.0–10.5)
nRBC: 0 % (ref 0.0–0.2)

## 2021-12-10 LAB — BASIC METABOLIC PANEL
Anion gap: 9 (ref 5–15)
BUN: 24 mg/dL — ABNORMAL HIGH (ref 8–23)
CO2: 23 mmol/L (ref 22–32)
Calcium: 9 mg/dL (ref 8.9–10.3)
Chloride: 106 mmol/L (ref 98–111)
Creatinine, Ser: 1.18 mg/dL (ref 0.61–1.24)
GFR, Estimated: >60 mL/min (ref >60)
Glucose, Bld: 147 mg/dL — ABNORMAL HIGH (ref 70–99)
Potassium: 3.7 mmol/L (ref 3.5–5.1)
Sodium: 138 mmol/L (ref 135–145)

## 2021-12-10 LAB — HEMOGLOBIN A1C
Hgb A1c MFr Bld: 4.7 % — ABNORMAL LOW (ref 4.8–5.6)
Mean Plasma Glucose: 88.19 mg/dL

## 2021-12-10 LAB — TSH: TSH: 1.422 u[IU]/mL (ref 0.350–4.500)

## 2021-12-10 LAB — MAGNESIUM: Magnesium: 2 mg/dL (ref 1.7–2.4)

## 2021-12-10 MED ORDER — IPRATROPIUM-ALBUTEROL 0.5-2.5 (3) MG/3ML IN SOLN
3.0000 mL | Freq: Two times a day (BID) | RESPIRATORY_TRACT | Status: DC
  Administered 2021-12-10: 3 mL via RESPIRATORY_TRACT
  Filled 2021-12-10: qty 3

## 2021-12-10 MED ORDER — DILTIAZEM HCL 90 MG PO TABS
45.0000 mg | ORAL_TABLET | Freq: Four times a day (QID) | ORAL | Status: AC
  Administered 2021-12-10 – 2021-12-11 (×6): 45 mg via ORAL
  Filled 2021-12-10 (×6): qty 0.5

## 2021-12-10 NOTE — Progress Notes (Signed)
NAME:  Hector [name], MRN:  [id number], DOB:  Mar 21, 1956, LOS: 6 ADMISSION DATE:  12/04/2021, CONSULTATION DATE:  12/07/21 REFERRING MD:  Daryll Drown - IMTS , CHIEF COMPLAINT:  Hypoxia  History of Present Illness:   66 yo M PMH HTN, PAD, HLD, etoh use, tobacco use admitted to IMTS 6/1 with CC SOB, cough. RVP, covid and flu a/B are neg. He has had markedly elevated O2 need -- escalating from 6L to NRB to 20L HFNC. CTA chest without evidence of PE but does have a small pleural effusion, some basilar opacities and some enlarged lymph nodes. A TTE was obtained  6/2 -- grade 1 diastolic dysfunction, mild AS.  ABG 6/4- 7.46/35/96/1.4/24.9/98 (on20 L HFNC)  CRP 0.7 ESR 9   Initially on admission, pt was started on 85m PO pred for ? AECOPD. He received this 6/1 and 6/2. Was dc 6/3 with leukocytosis. He has been receiving lasix starting 6/3   PCCM is consulted 6/4 in setting of refractory hypoxemia   In interview w pt, progressive SOB x 7-8 months. + cough for multiple months. Woke up in the middle of the night feeling like he couldn't breathe 5/31 -- 6/1, which is why he ended up coming to the hospital   Pertinent  Medical History  Tobacco dependence Etoh use Emphysema   Significant Hospital Events: Including procedures, antibiotic start and stop dates in addition to other pertinent events   6/1 admit to IMTS. Incr O2 -- 20L HFNC. CTA chest neg for PE. 440mpred.  6/2 cont on pred. ECHO with G1dd. 6/3 steroids stopped. Diuresed 6/4 Pulm consult  6/7  Interim History / Subjective:  Still on about 9 L of oxygen Saturations remained about the same Shortness of breath with activity  Objective   Blood pressure 120/73, pulse 93, temperature 97.9 F (36.6 C), temperature source Oral, resp. rate (!) 24, height _0  (1.753 m), weight 74.3 kg, SpO2 92 %.    FiO2 (%):  [100 %] 100 %   Intake/Output Summary (Last 24 hours) at 12/10/2021 1610 Last data filed at 12/10/2021 1119 Gross per 24 hour   Intake 757.76 ml  Output 3050 ml  Net -2292.24 ml   Filed Weights   12/08/21 0534 12/09/21 0432 12/10/21 0412  Weight: 72.3 kg 74.6 kg 74.3 kg    Examination: General: Appears comfortable HENT: Moist oral mucosa Lungs: bilateral rales  cardiovascular: S1-S2 appreciated  abdomen: soft ndnt. Extremities: no acute joint deformity. No edema  Neuro: AAOx3 following commands , appropriate  Labs : ACE 6/4 -21 PCT < 0.10 CRP normal Sed Rate normal Strep Pneumo Negative Legionella in process     Resolved Hospital Problem list     Assessment & Plan:  Acute respiratory failure with hypoxia Emphysema  Active smoker  -Continue steroids -Wean oxygen supplementation as tolerated -Continue diuresis  Work-up for inflammation so far negative   Smoking cessation counseling   Best Practice (right click and "Reselect all SmartList Selections" daily)   Per primary  Labs   CBC: Recent Labs  Lab 12/04/21 0810 12/04/21 0820 12/06/21 0104 12/07/21 0100 12/08/21 0053 12/09/21 0139 12/10/21 0101  WBC 11.4*   < > 14.5* 15.4* 14.0* 18.2* 16.9*  NEUTROABS 9.3*  --   --   --   --   --   --   HGB 10.7*   < > 8.2* 8.5* 9.4* 9.8* 10.4*  HCT 35.9*   < > 27.6* 28.0* 31.0* 32.3* 34.3*  MCV 69.0*   < >  69.3* 69.3* 69.4* 69.9* 70.6*  PLT 383   < > 360 376 404* 436* 439*   < > = values in this interval not displayed.    Basic Metabolic Panel: Recent Labs  Lab 12/05/21 0543 12/05/21 1431 12/06/21 0104 12/07/21 0100 12/08/21 0053 12/09/21 0139 12/10/21 0101  NA 139   < > 138 138 136 137 138  K 3.4*   < > 4.0 3.8 4.6 4.2 3.7  CL 106   < > 109 107 106 105 106  CO2 21*   < > _0 GLUCOSE 155*   < > 130* 114* 130* 136* 147*  BUN 15   < > _1 24*  CREATININE 1.00   < > 0.98 0.88 0.98 1.10 1.18  CALCIUM 9.0   < > 8.8* 8.7* 9.1 8.9 9.0  MG 1.8  --  2.5*  --   --   --  2.0   < > = values in this interval not displayed.   GFR: Estimated Creatinine  Clearance: 64.1 mL/min (by C-G formula based on SCr of 1.18 mg/dL). Recent Labs  Lab 12/07/21 0100 12/07/21 1422 12/08/21 0053 12/09/21 0139 12/10/21 0101  PROCALCITON  --  <0.10  --   --   --   WBC 15.4*  --  14.0* 18.2* 16.9*    Liver Function Tests: Recent Labs  Lab 12/04/21 0810 12/05/21 0543  AST 30 24  ALT 19 17  ALKPHOS 81 60  BILITOT 1.4* 0.7  PROT 7.2 6.0*  ALBUMIN 4.0 3.1*   No results for input(s): LIPASE, AMYLASE in the last 168 hours. No results for input(s): AMMONIA in the last 168 hours.  ABG    Component Value Date/Time   PHART 7.46 (H) 12/07/2021 1050   PCO2ART 35 12/07/2021 1050   PO2ART 96 12/07/2021 1050   HCO3 24.9 12/07/2021 1050   TCO2 25 12/04/2021 0820   O2SAT 98.5 12/07/2021 1050      Coagulation Profile: No results for input(s): INR, PROTIME in the last 168 hours.  Cardiac Enzymes: No results for input(s): CKTOTAL, CKMB, CKMBINDEX, TROPONINI in the last 168 hours.  HbA1C: No results found for: HGBA1C  CBG: No results for input(s): GLUCAP in the last 168 hours.  Allergies No Known Allergies   Home Medications  Prior to Admission medications   Medication Sig Start Date End Date Taking? Authorizing Provider  amLODipine (NORVASC) 10 MG tablet Take 10 mg by mouth daily.   Yes [provider]  ASPIRIN LOW DOSE 81 MG EC tablet TAKE 1 TABLET BY MOUTH EVERY DAY 05/23/20  Yes Patwardhan, Manish J, MD  clopidogrel (PLAVIX) 75 MG tablet Take 75 mg by mouth daily.   Yes [provider]  diclofenac Sodium (VOLTAREN) 1 % GEL as needed. 12/23/19  Yes [provider]  etodolac (LODINE) 400 MG tablet Take 400 mg by mouth 2 (two) times daily as needed. 12/23/19  Yes [provider]  lisinopril-hydrochlorothiazide (PRINZIDE,ZESTORETIC) 20-12.5 MG tablet Take 1 tablet by mouth daily.   Yes [provider]  Multiple Vitamins-Minerals (MULTIVITAMIN MEN 50+ PO) Take by mouth daily.   Yes [provider]  omeprazole (PRILOSEC OTC) 20 MG tablet Take 20 mg by mouth daily.   Yes [provider]  ondansetron (ZOFRAN-ODT) 4 MG disintegrating tablet Take 1 tablet (4 mg total) by mouth every 8 (eight) hours as needed for nausea or vomiting. 10/30/21  Yes Hendricks Limes, PA-C  rosuvastatin (CRESTOR) 20 MG tablet Take 1 tablet (20 mg total) by mouth at bedtime. 01/05/20 12/04/21 Yes Tolia, Sunit, DO   Sherrilyn Rist, MD [place] PCCM Pager: See Shea Evans

## 2021-12-10 NOTE — Consult Note (Signed)
CARDIOLOGY CONSULT NOTE  Patient ID: Hector [name] MRN: [id number] DOB/AGE: 06-Aug-1955 66 y.o.  Admit date: 12/04/2021 Attending physician: Sid Falcon, MD Primary Physician:  [place] [facility]patient Cardiologist: Rex Kras, [place], [facility] Inpatient Cardiologist: Rex Kras, DO, [facility]  Reason of consultation: Shortness of breath, hypoxia, evaluate for pulmonary arterial hypertension Referring physician: Dr. Farrel Gordon and Dr. Gilles Chiquito  Chief complaint: Shortness of breath and cough  HPI:  Hector [name] is a 66 y.o. African-American male who presents with a chief complaint of " shortness of breath and cough." His past medical history and cardiovascular risk factors include: Peripheral artery disease status post intervention, mixed hyperlipidemia, hypertension, alcohol and tobacco disorder, emphysema.  Patient presents to the hospital with a chief complaint of shortness of breath and coughing.  Symptoms are present for the last several months but progressive over the last 2 weeks.  Patient states that he woke up on the day of admission at 4:30 AM with difficulty breathing and felt as if the heart was pounding.  Presenting to the ED for further evaluation currently admitted for acute hypoxic respiratory failure under the care of primary and pulmonary medicine.  Cardiology has been asked to evaluate the patient for possible pulmonary hypertension given increased oxygen requirements compared to baseline.  Patient denies anginal discomfort at rest or with effort related activities.  He has chronic two-pillow orthopnea.  On physical examination he does not have swelling but on presentation per EMR he had 2+ bilateral swelling, denies PND.  He is currently being treated for HFpEF and a CT scan was done as he still requiring high flow nasal cannula oxygen.  CT and noted dilated pulmonary artery raising suspicion for possible pulmonary hypertension.  Patient was seen in clinic back in 2021 and  since then has been lost to follow-up.  He was suppose to be returning back to the clinic in 6 months given his PAD and multiple cardiovascular risk factors.  Currently sitting up in chair watching TV and on 9 L nasal cannula oxygen.  Patient complains of fragmented sleep at night usually wakes up every 30 minutes.  He continues to smoke 1.5 packs/day since the age of 66.  He consumes 25 ounces of beer x3 daily (which is improved compared to the past).  ALLERGIES: No Known Allergies  PAST MEDICAL HISTORY: Past Medical History:  Diagnosis Date   Arthritis    Claudication in peripheral vascular disease ([place]) 09/05/2018   GERD (gastroesophageal reflux disease)    Gout    L knee   Hyperlipidemia    Hypertension     PAST SURGICAL HISTORY: Past Surgical History:  Procedure Laterality Date   ABDOMINAL AORTOGRAM W/LOWER EXTREMITY N/A 09/06/2018   Procedure: ABDOMINAL AORTOGRAM W/LOWER EXTREMITY;  Surgeon: Nigel Mormon, MD;  Location: [facility];  Service: Cardiovascular;  Laterality: N/A;   FACIAL COSMETIC SURGERY     due ot shattered bones    PERIPHERAL VASCULAR BALLOON ANGIOPLASTY  09/06/2018   Procedure: PERIPHERAL VASCULAR BALLOON ANGIOPLASTY;  Surgeon: Nigel Mormon, MD;  Location: [place] [facility];  Service: Cardiovascular;;  LT SFA   right hand fracture     TOTAL KNEE ARTHROPLASTY Left 04/04/2013   Procedure: LEFT TOTAL KNEE ARTHROPLASTY;  Surgeon: Tobi Bastos, MD;  Location: [facility];  Service: Orthopedics;  Laterality: Left;    FAMILY HISTORY: The patient's family history includes Cancer in his mother.   SOCIAL HISTORY:  The patient  reports that he  has been smoking cigarettes. He has a 39.00 pack-year smoking history. He has never used smokeless tobacco. He reports current alcohol use of about 12.0 standard drinks per week. He reports that he does not use drugs.  MEDICATIONS: Current Outpatient Medications  Medication Instructions   amLODipine  (NORVASC) 10 mg, Oral, Daily   ASPIRIN LOW DOSE 81 MG EC tablet TAKE 1 TABLET BY MOUTH EVERY DAY   clopidogrel (PLAVIX) 75 mg, Oral, Daily   diclofenac Sodium (VOLTAREN) 1 % GEL As needed   etodolac (LODINE) 400 mg, Oral, 2 times daily PRN   lisinopril-hydrochlorothiazide (PRINZIDE,ZESTORETIC) 20-12.5 MG tablet 1 tablet, Oral, Daily   Multiple Vitamins-Minerals (MULTIVITAMIN MEN 50+ PO) Oral, Daily   omeprazole (PRILOSEC OTC) 20 mg, Oral, Daily   ondansetron (ZOFRAN-ODT) 4 mg, Oral, Every 8 hours PRN   rosuvastatin (CRESTOR) 20 mg, Oral, Daily at bedtime    REVIEW OF SYSTEMS: Review of Systems  Constitutional: Negative for weight gain and weight loss.  Cardiovascular:  Positive for dyspnea on exertion, leg swelling and orthopnea (Two-pillow, chronic). Negative for chest pain, palpitations, paroxysmal nocturnal dyspnea and syncope.  Respiratory:  Positive for cough, shortness of breath and sputum production (Yellow at the time of admission).   Hematologic/Lymphatic: Negative for bleeding problem.  Musculoskeletal:  Positive for back pain and joint pain (knee and hip).  Neurological:  Positive for dizziness. Negative for light-headedness.  All other systems reviewed and are negative.  PHYSICAL EXAM:    12/10/2021   11:34 AM 12/10/2021   11:19 AM 12/10/2021    7:30 AM  Vitals with BMI  Systolic [id number]  Diastolic 73 70 62  Pulse  86 78     Intake/Output Summary (Last 24 hours) at 12/10/2021 1359 Last data filed at 12/10/2021 1119 Gross per 24 hour  Intake 757.76 ml  Output 3050 ml  Net -2292.24 ml    Net IO Since Admission: -12,727.05 mL [12/10/21 1359]  CONSTITUTIONAL: Age-appropriate, well-developed and well-nourished. No acute distress.  SKIN: Skin is warm and dry. No rash noted. No cyanosis. No pallor. No jaundice HEAD: Normocephalic and atraumatic.  Nasal cannula oxygen EYES: No scleral icterus MOUTH/THROAT: Moist oral membranes.  NECK: No JVD present. No thyromegaly  noted. No carotid bruits  CHEST Normal respiratory effort. No intercostal retractions  LUNGS: Clear to auscultation bilaterally.  No stridor. No wheezes. No rales.  CARDIOVASCULAR: Regular rate and rhythm, positive S1-S2, no murmurs rubs or gallops appreciated ABDOMINAL: Soft, nontender, nondistended, positive bowel sounds in all 4 quadrants, no apparent ascites.  EXTREMITIES: No pitting edema lower extremities bilaterally, warm to touch, 1+ left DP, 2+ right DP HEMATOLOGIC: No significant bruising NEUROLOGIC: Oriented to person, place, and time. Nonfocal. Normal muscle tone.  PSYCHIATRIC: Normal mood and affect. Normal behavior. Cooperative  RADIOLOGY: CT Chest High Resolution  Result Date: 12/10/2021 CLINICAL DATA:  Hypoxemia, shortness of breath. EXAM: CT CHEST WITHOUT CONTRAST TECHNIQUE: Multidetector CT imaging of the chest was performed following the standard protocol without intravenous contrast. High resolution imaging of the lungs, as well as inspiratory and expiratory imaging, was performed. RADIATION DOSE REDUCTION: This exam was performed according to the departmental dose-optimization program which includes automated exposure control, adjustment of the mA and/or kV according to patient size and/or use of iterative reconstruction technique. COMPARISON:  12/04/2021, 04/06/2013. FINDINGS: Cardiovascular: Atherosclerotic calcification of the aorta and aortic valve. Enlarged pulmonic trunk. Heart size normal. No pericardial effusion. Mediastinum/Nodes: Mediastinal lymph nodes measure up to 1.6 cm in the AP window, decreased  from 2.0 cm on 12/04/2021, and similar to 04/06/2013. Hilar regions are difficult to evaluate without IV contrast but appear full. No axillary adenopathy. Esophagus is grossly unremarkable. Small hiatal hernia. Lungs/Pleura: Centrilobular and paraseptal emphysema. Smoking related respiratory bronchiolitis. Negative for subpleural reticulation, traction  bronchiectasis/bronchiolectasis, ground-glass, architectural distortion or honeycombing. 4 mm nodule along the right major fissure (5/74), unchanged from 04/06/2013 and benign. 3 mm medial left lower lobe nodule, difficult to compare with 04/06/2013 due to respiratory motion and expiratory phase imaging. No pleural fluid. Airway is unremarkable. No air trapping. Upper Abdomen: Visualized portions of the liver, adrenal glands, kidneys, spleen, pancreas, stomach and bowel are grossly unremarkable with the exception of an aforementioned small hiatal hernia. No upper abdominal adenopathy. Musculoskeletal: No worrisome lytic or sclerotic lesions. IMPRESSION: 1. No evidence of interstitial lung disease. 2. 3 mm medial left lower lobe nodule. Although likely benign, as patient is high-risk, non-contrast chest CT can be considered in 12 months. This could be done in conjunction with annual lung cancer screening.This recommendation follows the consensus statement: Guidelines for Management of Incidental Pulmonary Nodules Detected on CT Images: From the Fleischner Society 2017; Radiology 2017; 284:228-243. 3. Slight decrease in mediastinal adenopathy, now similar to 04/06/2013. 4. Low-dose CT lung cancer screening is recommended for patients who are 64-18 years of age with a 20+ pack-year history of smoking, and who are currently smoking or quit <=15 years ago. 5.  Aortic atherosclerosis (ICD10-I70.0). 6. Enlarged pulmonic trunk, indicative of pulmonary arterial hypertension. 7.  Emphysema (ICD10-J43.9). Electronically Signed   By: Lorin Picket M.D.   On: 12/10/2021 08:47    LABORATORY DATA: Lab Results  Component Value Date   WBC 16.9 (H) 12/10/2021   HGB 10.4 (L) 12/10/2021   HCT 34.3 (L) 12/10/2021   MCV 70.6 (L) 12/10/2021   PLT 439 (H) 12/10/2021    Lab Results  Component Value Date   IRON 19 (L) 12/05/2021   TIBC 458 (H) 12/05/2021   FERRITIN 21 (L) 12/05/2021   Recent Labs  Lab 12/05/21 0543  12/05/21 1431 12/10/21 0101  NA 139   < > 138  K 3.4*   < > 3.7  CL 106   < > 106  CO2 21*   < > 23  BUN 15   < > 24*  CREATININE 1.00   < > 1.18  CALCIUM 9.0   < > 9.0  PROT 6.0*  --   --   BILITOT 0.7  --   --   ALKPHOS 60  --   --   ALT 17  --   --   AST 24  --   --   GLUCOSE 155*   < > 147*   < > = values in this interval not displayed.    Lipid Panel  No results found for: CHOL, HDL, LDLCALC, LDLDIRECT, TRIG, CHOLHDL  BNP (last 3 results) Recent Labs    12/04/21 0810 12/09/21 0139  BNP 127.8* 218.1*    HEMOGLOBIN A1C No results found for: HGBA1C, MPG  Cardiac Panel (last 3 results) No results for input(s): CKTOTAL, CKMB, TROPONINIHS, RELINDX in the last 72 hours.   TSH Recent Labs    12/05/21 0543 12/10/21 0101  TSH 0.311* 1.422     CARDIAC DATABASE: EKG: 12/04/2021: NSR, 95 bpm rightward axis, ST-T changes in the inferior and lateral leads cannot entirely rule out ischemia, without injury pattern, similar findings to EKG dated 10/30/2021.  12/09/2021: NSR 77 bpm, without underlying ischemia injury  pattern.  Compared to prior ECG TWI in inferolateral leads are not present.  12/09/2021: Atrial fibrillation, 145 bpm, ST-T changes in the inferolateral leads, without underlying injury pattern.  12/10/2021: Normal sinus rhythm, 68 bpm PACs, without underlying injury pattern.  Echocardiogram: 12/05/2021: LVEF 60 to 65%, no regional wall motion abnormality, grade 1 diastolic dysfunction, right ventricular systolic function normal sinus, tricuspid regurgitation is inadequate for assessment of PA pressures, estimated RAP 15 mmHg.  Exercise sestamibi stress test 07/11/2018: Patient exercised on Bruce protocol for 7:15 minutes and achieved 8.24 METS.  Achieved 88% of maximum predicted heart rate. Myocardial perfusion imaging is normal. Overall left ventricular systolic function was normal without regional wall motion abnormalities. The left ventricular ejection fraction was  62%.  This is a low risk study.   Heart Catheterization: None   Abdominal aortic runoff with peripheral vascular intervention: 09/06/2018: Patent renal arteries Normal distal abdominal aorta & bilateral EIA and Rt Internal iliac artery Lt Internal iliac artery 50% stenosis Rt SFA mid 30% stenosis Lt SFA mid 80% stenosis prox 40% stenosis without pullback gradient Patent Lt PTA. Occluded Lt ATA and peroneal artery. Peroneal artery reconstitutes with collaterals to have two vessel runoff at ankle level Successful PTA 5.0 X 40 mm Mustang & 6.0 X 80 mm In.Pact Admiral DCB in left mid SFA 0% residual stenosis   Vascular imaging: Lower Extremity Arterial Duplex 12/23/2018: Diffuse heterogeneous plaque in both lower extremities.  Mildly abnormal spectral waveforms of the right ankle. Normal PVR waveforms of the right ankle. Mildly abnormal spectral waveforms of the left ankle.  Mildly abnormal PVR waveforms of the left ankle.  Mildly decreased right resting ABI 0.81. Moderately decreased left resting ABI 0.74.  Compared to 07/08/2018, left SFA stenosis not present, represents successful revascularization. No significant change in ABI.    Lower Extremity Arterial Duplex 12/25/2019:  No hemodynamically significant stenoses are identified in the right lower extremity arterial system.  Moderate velocity increase at the left mid popliteal artery suggests >50% stenosis.  Left SFA angioplasty site appears patent.  This exam reveals mildly decreased perfusion of the right lower extremity, noted at the dorsalis pedis artery level (ABI 0.88) and normal perfusion of the left lower extremity (ABI 1.00).  Compared to the study done on 12/23/2018, no significant change in the right ABI, left ABI was 0.74.  Left SFA angioplasty site was previously patent.  It is patent now as well however popliteal artery stenosis is new.  IMPRESSION & RECOMMENDATIONS: Hector [name] is a 66 y.o. African-American male whose past  medical history and cardiovascular risk factors include: Peripheral artery disease status post intervention, mixed hyperlipidemia, hypertension, alcohol and tobacco disorder, emphysema.  Impression:  Dilated pulmonary artery Acute on chronic heart failure with preserved EF, stage C, NYHA class II/III. Acute hypoxic respiratory failure -multifactorial which includes but not limited to (cigarette smoking, emphysema/questionable COPD, acute HFpEF, paroxysmal atrial fibrillation, anemia etc). Paroxysmal atrial fibrillation Peripheral vascular disease Cigarette smoking Hypertension   Plan:  Dilated pulmonary artery: Incidentally noted on CT high-resolution study 12/10/2021 Echo independently reviewed: LVEF is preserved, right ventricular size and function preserved, TR signal is inadequate to estimate RVSP or PASP.  RV S prime and TAPSE within normal limits. Overall clinical suspicion for WHO Group I Pulmonary Arterial Hypertension is low.  However we did discuss the work-up which includes laboratory studies, VQ scan, sleep study, and right heart catheterization.  He is diuresed a total net negative urine output of at least 12 L during  hospitalization.  Recommend continue to diurese and uptitrate GDMT.  Need outpatient sleep study and VQ scan study.  After discussing the risks, benefits, and alternatives he would like to hold off on heart catheterization at this time. HIV screen: Nonreactive. Angiotensin-converting enzymes: Within normal limits. C-ANCA and p-ANCA within normal limits  Acute hypoxic respiratory failure: Present on admission. Improving. Multifactorial which includes but not limited to (cigarette smoking, emphysema/questionable COPD, acute HFpEF, paroxysmal atrial fibrillation, anemia etc). Educated on the importance of complete smoking cessation. COPD and emphysema work-up per pulmonary medicine HFpEF recommendations noted below. PAF recommendations noted below. Anemia work-up  per primary team.  Acute HFpEF: Newly discovered during his hospitalization Patient presents with shortness of breath at rest and with effort related activities, bilateral lower extremity swelling, pulmonary vascular congestion on chest x-ray, pleural effusion, elevated BNP, estimated RAP 15 mmHg and dilated IVC on recent echo. May have been precipitated by PAFib given his underlying emphysema / COPD Net IO Since Admission: -12,457.05 mL [12/10/21 1651] Continue IV Lasix. Recommend starting Farxiga 10 mg p.o. daily. Recommend starting Entresto 24/26 mg p.o. twice daily. Monitor BUN and creatinine and uptitrate GDMT as hemodynamics and laboratory values allow. Monitor strict I's and O's and daily weights. Currently on nasal cannula oxygen  Paroxysmal atrial fibrillation: Newly discovered during his hospitalization Currently normal sinus rhythm. Currently on diltiazem for rate control strategy. CHA2DS2-VASc SCORE is 3 which correlates to 3 % risk of stroke per year (HFpEF, aortic atherosclerosis, HTN).  Will defer recommendations for outpatient oral anticoagulation between the primary team and the patient; however, please keep in mind the underlying anemia, chronic alcohol use (may need evaluation for possible varices).  Cardiac rhythm strips reviewed Recommend outpatient sleep study given his poor sleep hygiene. Educated him on the importance of complete alcohol cessation.  Peripheral artery disease:  Chronic and stable. Currently asymptomatic. Given his anemia would recommend aspirin 81 mg p.o. daily for now.  Hold the Plavix. Educated on the importance of complete smoking cessation. Educated on the importance of regular structured exercise program or daily routine with regards to physical activity. Last lower extremity duplex 2021-since then he has been lost to follow-up Education the importance of outpatient follow-up. Check fasting lipid and a1c for further risk  stratification.  Cigarette smoker: Tobacco cessation counseling: Currently smoking 1.5 packs/day, since 66 years of age Patient is not willing to quit at this time. 8 mins were spent counseling patient cessation techniques. We discussed various methods to help quit smoking, including deciding on a date to quit, joining a support group, pharmacological agents- nicotine gum/patch/lozenges.   Total encounter time 85 minutes. *Total Encounter Time as defined by the Centers for Medicare and Medicaid Services includes, in addition to the face-to-face time of a patient visit (documented in the note above) non-face-to-face time: obtaining and reviewing outside history, ordering and reviewing medications, tests or procedures, care coordination (communications with other health care professionals or caregivers) and documentation in the medical record.  Patient's questions and concerns were addressed to his satisfaction. He voices understanding of the instructions provided during this encounter.   This note was created using a voice recognition software as a result there may be grammatical errors inadvertently enclosed that do not reflect the nature of this encounter. Every attempt is made to correct such errors.  Mechele [facility]  Pager: [phone] Office: [phone] 12/10/2021, 1:59 PM

## 2021-12-10 NOTE — Progress Notes (Addendum)
HD#6 SUBJECTIVE:  Patient Summary: Hector [name] is a 66 y.o. male with a history of HTN and PAD presenting with worsening cough and shortness of breath, admitted for acute hypoxemic respiratory failure.  Overnight Events: Patient had an episode of asymptomatic atrial fibrillation. He was started on diltiazem with conversion to sinus rhythm. He reports possible mild dizziness overnight but otherwise no symptoms. TSH, magnesium checked and were normal.  Interim History: Patient is resting comfortably in bed. He has no complaints at this time. He has not had  BM yet despite increasing bowel regimen yesterday.  OBJECTIVE:  Vital Signs: Vitals:   12/10/21 0405 12/10/21 0412 12/10/21 0730 12/10/21 0852  BP:   116/62   Pulse:   78   Resp:   16   Temp: 97.9 F (36.6 C)     TempSrc: Oral     SpO2:   92% 92%  Weight:  74.3 kg    Height:       Supplemental O2: HFNC SpO2: 92 % O2 Flow Rate (L/min): 10 L/min FiO2 (%): 100 %  Filed Weights   12/08/21 0534 12/09/21 0432 12/10/21 0412  Weight: 72.3 kg 74.6 kg 74.3 kg     Intake/Output Summary (Last 24 hours) at 12/10/2021 0954 Last data filed at 12/10/2021 0731 Gross per 24 hour  Intake 997.76 ml  Output 1925 ml  Net -927.24 ml   Net IO Since Admission: -11,602.05 mL [12/10/21 0954]  Physical Exam: Constitutional:Chronically ill appearing gentleman resting comfortably in bed, in no acute distress. Cardio:Regular rate and rhythm. No murmurs, rubs, and gallops. Pulm:Bibasilar crackles noted. Normal work of breathing and comfortable on 10 L HFNC. [id number] for extremity edema. Skin:Warm and dry. Neuro:Alert and oriented x3. No focal deficit noted. Psych:Pleasant mood and affect.  Patient Lines/Drains/Airways Status     Active Line/Drains/Airways     Name Placement date Placement time Site Days   Peripheral IV 12/04/21 20 G Left Antecubital 12/04/21  0810  Antecubital  6             ASSESSMENT/PLAN:   Assessment: Principal Problem:   Acute hypoxemic respiratory failure (HCC) Active Problems:   TOBACCO ABUSE   Claudication in peripheral vascular disease ([place])   Plan: #Acute hypoxemic respiratory failure #Pulmonary edema Patient stable on 10 L HFNC with O2 saturations in low-mid 90's. He continues to diurese well, currently net -11.6 L, though does appear to be slowing. Leukocytosis is downtrending, at 16.9 today. HRCT chest was done and showed no evidence of ILD, 3 mm medial LLL nodule with recommended f/u in 12 months, slight decrease in mediastinal adenopathy, enlarged pulmonic trunk suggesting PAH, and emphysema. -PCCM consulted, appreciate their assistance             -Prednisone 50 mg daily with plan to down-titrate             -Angiotensin-converting enzyme level WNL             -Will need bronch EBUS once oxygen requirements have improved -Will consult cardiology for possible R heart cath given HRCT suggesting PAH -ANA with reflex pending, ANCA labs unremarkable -Continue diuresis with IV furosemide 40mg  twice daily -Wean O2 as tolerated -DuoNebs every 4 hours -Trend WBC -He will need outpatient PFT's -Smoking cessation counseling  #Atrial fibrillation, CHAD2DS-VAS2c score 3 Patient had an episode of atrial fibrillation overnight with HR >140. He has no known history of arrhythmia. He was largely asymptomatic though does endorse some possible mild dizziness overnight. He  was started on diltiazem drip with conversion to sinus rhythm, which he has remained in since. He is currently on a drip at 5 mg/h. Repeat ECG this morning revealed sinus rhythm with PACs. -Transition to PO diltiazem at 45 mg q6h for daily total of 180 mg -Will make cardiology aware of new arrhythmia in consult above   #Iron deficiency anemia Hgb stable, 10.4 today. -Continue ferrous sulfate 325mg  daily -Trend CBC   #Tobacco use disorder Continue nicotine patch, encouragement for smoking cessation.    #Peripheral vascular disease Continue home antiplatelets and Crestor.   #Hypertension Normotensive at this time. -Continue to hold home antihypertensives  Best Practice: Diet: Regular diet IVF: None VTE: enoxaparin (LOVENOX) injection 40 mg Start: 12/04/21 2200 Code: Full AB: None DISPO: Anticipated discharge pending Medical stability and New oxygen.  Signature: Farrel Gordon, D.O.  Internal Medicine Resident, PGY-1 [facility]  Pager: [phone] 9:54 AM, 12/10/2021   Please contact the on call pager after 5 pm and on weekends at [phone].

## 2021-12-10 NOTE — Plan of Care (Signed)
  Problem: Nutrition: Goal: Adequate nutrition will be maintained Outcome: Progressing   Problem: Coping: Goal: Level of anxiety will decrease Outcome: Progressing   Problem: Elimination: Goal: Will not experience complications related to bowel motility Outcome: Progressing Goal: Will not experience complications related to urinary retention Outcome: Progressing   Problem: Pain Managment: Goal: General experience of comfort will improve Outcome: Progressing   Problem: Safety: Goal: Ability to remain free from injury will improve Outcome: Progressing   Problem: Activity: Goal: Risk for activity intolerance will decrease Outcome: Not Progressing

## 2021-12-10 NOTE — Progress Notes (Signed)
Mobility Specialist Progress Note    12/10/21 1142  Mobility  Activity Transferred from bed to chair  Level of Assistance Standby assist, set-up cues, supervision of patient - no hands on  Assistive Device None  Distance Ambulated (ft) 8 ft  Activity Response Tolerated well  $Mobility charge 1 Mobility   Pre-Mobility: 86 HR, 94% SpO2 Post-Mobility: 97 HR, 93% SpO2  Pt received sitting EOB and agreeable. No complaints. Left with call bell in reach.    Hildred Alamin Mobility Specialist

## 2021-12-10 NOTE — Evaluation (Signed)
Physical Therapy Evaluation Patient Details Name: Hector [name] MRN: [id number] DOB: December 23, 1955 Today's Date: 12/10/2021  History of Present Illness  Pt is a 66 y.o. male admitted 12/04/21 with cough, SOB requiring NRB and HFNC. Chest CTA with small pleural effusion. TTE 6/2 with grade 1 diastolic dysfunction, mild AS. PMH includes HTN, HLD, arthritis, PVD, gout.   Clinical Impression  Pt presents with an overall decrease in functional mobility secondary to above. PTA, pt independent, retired, lives alone. Today, pt moving well, only requiring assist for lines/O2 management; endorses SOB and fatigue with ambulation. Educ re: activity recommendations, therex, importance of mobility. Pt would benefit from continued acute PT services to maximize functional mobility and independence prior to d/c home.     SpO2 96% on 10L O2 HFNC at rest (beginning of session) SpO2 88% on 10L O2 HFNC with ambulation  SpO2 >/93% on 9L O2 HFNC at rest (end of session)   Recommendations for follow up therapy are one component of a multi-disciplinary discharge planning process, led by the attending physician.  Recommendations may be updated based on patient status, additional functional criteria and insurance authorization.  Follow Up Recommendations No PT follow up    Assistance Recommended at Discharge PRN  Patient can return home with the following  Assist for transportation    Equipment Recommendations None recommended by PT  Recommendations for Other Services       Functional Status Assessment Patient has had a recent decline in their functional status and demonstrates the ability to make significant improvements in function in a reasonable and predictable amount of time.     Precautions / Restrictions Precautions Precautions: Other (comment) Precaution Comments: Watch SpO2 (does not wear baseline) Restrictions Weight Bearing Restrictions: No      Mobility  Bed Mobility Overal bed mobility:  Independent                  Transfers Overall transfer level: Independent Equipment used: None                    Ambulation/Gait Ambulation/Gait assistance: Supervision Gait Distance (Feet): 240 Feet Assistive device: None Gait Pattern/deviations: Step-through pattern, Decreased stride length Gait velocity: Decreased     General Gait Details: slow, steady gait without DME; endorses BLE fatigue and weakness with distance; DOE 2-3/4; SpO2 88% on 10L O2 HFNC  Stairs            Wheelchair Mobility    Modified Rankin (Stroke Patients Only)       Balance Overall balance assessment: Needs assistance   Sitting balance-Leahy Scale: Good       Standing balance-Leahy Scale: Good                               Pertinent Vitals/Pain Pain Assessment Pain Assessment: Faces Faces Pain Scale: Hurts a little bit Pain Location: hip with ambulation Pain Descriptors / Indicators: Sore, Tiring Pain Intervention(s): Monitored during session    Home Living Family/patient expects to be discharged to:: Private residence Living Arrangements: Alone Available Help at Discharge: Friend(s);Available PRN/intermittently Type of Home: House Home Access: Stairs to enter Entrance Stairs-Rails: Left Entrance Stairs-Number of Steps: 4-5 Alternate Level Stairs-Number of Steps: flight Home Layout: Two level;Bed/bath upstairs Home Equipment: Rolling Walker (2 wheels)      Prior Function Prior Level of Function : Independent/Modified Independent             Mobility Comments:  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 enjoys time with his two dogs       Hand Dominance        Extremity/Trunk Assessment   Upper Extremity Assessment Upper Extremity Assessment: Overall WFL for tasks assessed    Lower Extremity Assessment Lower Extremity Assessment: Overall WFL for tasks assessed    Cervical / Trunk  Assessment Cervical / Trunk Assessment: Normal  Communication   Communication: No difficulties  Cognition Arousal/Alertness: Awake/alert Behavior During Therapy: WFL for tasks assessed/performed Overall Cognitive Status: Within Functional Limits for tasks assessed                                          General Comments General comments (skin integrity, edema, etc.): SpO2 96% on 10L O2 HFNC, down to 88% with ambulation on 10L. Maintaining >/93% on 9L O2 HFNC at rest    Exercises Other Exercises Other Exercises: educ on standing therex options within confines of lines - including repeated sit<>stands, marching in place, walking forwards/backwards within O2 line distance   Assessment/Plan    PT Assessment Patient needs continued PT services  PT Problem List Decreased strength;Decreased activity tolerance;Decreased mobility;Cardiopulmonary status limiting activity       PT Treatment Interventions Gait training;Stair training;Functional mobility training;Therapeutic activities;Therapeutic exercise;Patient/family education    PT Goals (Current goals can be found in the Care Plan section)  Acute Rehab PT Goals Patient Stated Goal: return home to dogs PT Goal Formulation: With patient Time For Goal Achievement: 12/24/21 Potential to Achieve Goals: Good    Frequency Min 2X/week     Co-evaluation               AM-PAC PT "6 Clicks" Mobility  Outcome Measure Help needed turning from your back to your side while in a flat bed without using bedrails?: None Help needed moving from lying on your back to sitting on the side of a flat bed without using bedrails?: None Help needed moving to and from a bed to a chair (including a wheelchair)?: None Help needed standing up from a chair using your arms (e.g., wheelchair or bedside chair)?: None Help needed to walk in hospital room?: A Little Help needed climbing 3-5 steps with a railing? : A Little 6 Click Score:  22    End of Session Equipment Utilized During Treatment: Oxygen;Gait belt Activity Tolerance: Patient tolerated treatment well Patient left: in bed;with call bell/phone within reach Nurse Communication: Mobility status PT Visit Diagnosis: Other abnormalities of gait and mobility (R26.89)    Time: [id number] PT Time Calculation (min) (ACUTE ONLY): 18 min   Charges:   PT Evaluation $PT Eval Moderate Complexity: 1 Mod        Mabeline Caras, PT, DPT Acute Rehabilitation Services  Pager [phone] Office [place] 12/10/2021, 5:46 PM

## 2021-12-11 ENCOUNTER — Inpatient Hospital Stay (HOSPITAL_COMMUNITY): Payer: No Typology Code available for payment source

## 2021-12-11 ENCOUNTER — Other Ambulatory Visit (HOSPITAL_COMMUNITY): Payer: Self-pay

## 2021-12-11 LAB — BASIC METABOLIC PANEL
Anion gap: 13 (ref 5–15)
BUN: 18 mg/dL (ref 8–23)
CO2: 22 mmol/L (ref 22–32)
Calcium: 8.8 mg/dL — ABNORMAL LOW (ref 8.9–10.3)
Chloride: 100 mmol/L (ref 98–111)
Creatinine, Ser: 1.01 mg/dL (ref 0.61–1.24)
GFR, Estimated: >60 mL/min (ref >60)
Glucose, Bld: 113 mg/dL — ABNORMAL HIGH (ref 70–99)
Potassium: 4.3 mmol/L (ref 3.5–5.1)
Sodium: 135 mmol/L (ref 135–145)

## 2021-12-11 LAB — LIPID PANEL
Cholesterol: 98 mg/dL (ref 0–200)
HDL: 52 mg/dL (ref >40)
LDL Cholesterol: 32 mg/dL (ref 0–99)
Total CHOL/HDL Ratio: 1.9 RATIO
Triglycerides: 71 mg/dL (ref <150)
VLDL: 14 mg/dL (ref 0–40)

## 2021-12-11 LAB — FANA STAINING PATTERNS: Speckled Pattern: 24529 — ABNORMAL HIGH

## 2021-12-11 LAB — CBC
HCT: 35.7 % — ABNORMAL LOW (ref 39.0–52.0)
Hemoglobin: 10.6 g/dL — ABNORMAL LOW (ref 13.0–17.0)
MCH: 21.1 pg — ABNORMAL LOW (ref 26.0–34.0)
MCHC: 29.7 g/dL — ABNORMAL LOW (ref 30.0–36.0)
MCV: 71.1 fL — ABNORMAL LOW (ref 80.0–100.0)
Platelets: 440 10*3/uL — ABNORMAL HIGH (ref 150–400)
RBC: 5.02 MIL/uL (ref 4.22–5.81)
RDW: 21.5 % — ABNORMAL HIGH (ref 11.5–15.5)
WBC: 16.3 10*3/uL — ABNORMAL HIGH (ref 4.0–10.5)
nRBC: 0 % (ref 0.0–0.2)

## 2021-12-11 LAB — ANTINUCLEAR ANTIBODIES, IFA: ANA Ab, IFA: POSITIVE — AB

## 2021-12-11 MED ORDER — DILTIAZEM HCL ER COATED BEADS 180 MG PO CP24
180.0000 mg | ORAL_CAPSULE | Freq: Every day | ORAL | Status: DC
  Administered 2021-12-12 – 2021-12-15 (×4): 180 mg via ORAL
  Filled 2021-12-11 (×4): qty 1

## 2021-12-11 MED ORDER — ALUM & MAG HYDROXIDE-SIMETH 200-200-20 MG/5ML PO SUSP
30.0000 mL | ORAL | Status: DC | PRN
  Administered 2021-12-11: 30 mL via ORAL
  Filled 2021-12-11: qty 30

## 2021-12-11 MED ORDER — DAPAGLIFLOZIN PROPANEDIOL 10 MG PO TABS
10.0000 mg | ORAL_TABLET | Freq: Every day | ORAL | Status: DC
  Administered 2021-12-11 – 2021-12-15 (×5): 10 mg via ORAL
  Filled 2021-12-11 (×5): qty 1

## 2021-12-11 MED ORDER — SACUBITRIL-VALSARTAN 24-26 MG PO TABS
1.0000 | ORAL_TABLET | Freq: Two times a day (BID) | ORAL | Status: DC
  Administered 2021-12-11 – 2021-12-15 (×9): 1 via ORAL
  Filled 2021-12-11 (×10): qty 1

## 2021-12-11 NOTE — Evaluation (Signed)
Occupational Therapy Evaluation Patient Details Name: Hector [name] MRN: [id number] DOB: 15-Jul-1955 Today's Date: 12/11/2021   History of Present Illness Pt is a 66 y.o. male admitted 12/04/21 with cough, SOB requiring NRB and HFNC. Chest CTA with small pleural effusion. TTE 6/2 with grade 1 diastolic dysfunction, mild AS. PMH includes HTN, HLD, arthritis, PVD, gout.   Clinical Impression   Patient admitted for the diagnosis above.  PTA he lives alone, and needed no assist with ADL/iADL, or mobility.  Patient is down to 8L of O2 via [place], and saturated 95% throughout.  Beyond O2 need, he has no significant acute OT needs.  No OT is anticipated post acute.  Encouraged mobility during the day.        Recommendations for follow up therapy are one component of a multi-disciplinary discharge planning process, led by the attending physician.  Recommendations may be updated based on patient status, additional functional criteria and insurance authorization.   Follow Up Recommendations  No OT follow up    Assistance Recommended at Discharge PRN  Patient can return home with the following      Functional Status Assessment  Patient has had a recent decline in their functional status and demonstrates the ability to make significant improvements in function in a reasonable and predictable amount of time.  Equipment Recommendations  None recommended by OT    Recommendations for Other Services       Precautions / Restrictions Precautions Precautions: Other (comment) Precaution Comments: Watch SpO2 (does not wear baseline) Restrictions Weight Bearing Restrictions: No      Mobility Bed Mobility Overal bed mobility: Independent               Patient Response: Cooperative  Transfers Overall transfer level: Independent Equipment used: None                      Balance Overall balance assessment: No apparent balance deficits (not formally assessed)                                          ADL either performed or assessed with clinical judgement   ADL Overall ADL's : At baseline                                             Vision Patient Visual Report: No change from baseline       Perception Perception Perception: Not tested   Praxis Praxis Praxis: Not tested    Pertinent Vitals/Pain Pain Assessment Pain Assessment: Faces Faces Pain Scale: Hurts a little bit Pain Location: hip with ambulation Pain Descriptors / Indicators: Tender Pain Intervention(s): Monitored during session     Hand Dominance Left   Extremity/Trunk Assessment Upper Extremity Assessment Upper Extremity Assessment: Overall WFL for tasks assessed       Cervical / Trunk Assessment Cervical / Trunk Assessment: Normal   Communication Communication Communication: No difficulties   Cognition Arousal/Alertness: Awake/alert Behavior During Therapy: WFL for tasks assessed/performed Overall Cognitive Status: Within Functional Limits for tasks assessed                                       General  Comments   VSS on 8L HFNC    Exercises     Shoulder Instructions      Home Living Family/patient expects to be discharged to:: Private residence Living Arrangements: Alone Available Help at Discharge: Friend(s);Available PRN/intermittently Type of Home: House Home Access: Stairs to enter [employer] of Steps: 4-5 Entrance Stairs-Rails: Left Home Layout: Two level;Bed/bath upstairs Alternate Level Stairs-Number of Steps: flight Alternate Level Stairs-Rails: Right Bathroom Shower/Tub: Teacher, early years/pre: Standard     Home Equipment: Conservation officer, nature (2 wheels)          Prior Functioning/Environment Prior Level of Function : Independent/Modified Independent             Mobility Comments: 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 enjoys time  with his two dogs ADLs Comments: No assist needed for ADL/IADL        OT Problem List: Decreased activity tolerance      OT Treatment/Interventions:      OT Goals(Current goals can be found in the care plan section) Acute Rehab OT Goals Patient Stated Goal: Return home to his dogs OT Goal Formulation: With patient Time For Goal Achievement: 12/15/21 Potential to Achieve Goals: Good  OT Frequency:      Co-evaluation              AM-PAC OT "6 Clicks" Daily Activity     Outcome Measure Help from another person eating meals?: None Help from another person taking care of personal grooming?: None Help from another person toileting, which includes using toliet, bedpan, or urinal?: None Help from another person bathing (including washing, rinsing, drying)?: None Help from another person to put on and taking off regular upper body clothing?: None Help from another person to put on and taking off regular lower body clothing?: None 6 Click Score: 24   End of Session Equipment Utilized During Treatment: Oxygen Nurse Communication: Mobility status  Activity Tolerance: Patient tolerated treatment well Patient left: in bed;with call bell/phone within reach  OT Visit Diagnosis: Unsteadiness on feet (R26.81)                Time: [id number] OT Time Calculation (min): 18 min Charges:  OT General Charges $OT Visit: 1 Visit OT Evaluation $OT Eval Moderate Complexity: 1 Mod  12/11/2021  RP, OTR/L  Acute Rehabilitation Services  Office:  [phone]   Metta Clines 12/11/2021, 10:41 AM

## 2021-12-11 NOTE — Progress Notes (Signed)
HD#7 SUBJECTIVE:  Patient Summary: Hector [name] is a 66 y.o. male with a history of HTN and PAD presenting with worsening cough and shortness of breath, admitted for acute hypoxemic respiratory failure.  Overnight Events: None  Interim History: Patient evaluated at bedside where he was sitting comfortably on side of bed. He tells the team that he had worked with PT this morning and walked around the halls. He reports not having significant dyspnea during exercise, overall comparable to baseline. He is tolerating weaning of O2 further without complaints. He is eager to go home still but understands that it's not safe to do so yet.  OBJECTIVE:  Vital Signs: Vitals:   12/10/21 2007 12/11/21 0003 12/11/21 0320 12/11/21 0500  BP:  112/70 115/73   Pulse:  70 74   Resp:  19 20   Temp:  98.4 F (36.9 C) 98.6 F (37 C)   TempSrc:  Oral Oral   SpO2: 96% 94% 97%   Weight:    74.1 kg  Height:       Supplemental O2: HFNC SpO2: 97 % O2 Flow Rate (L/min): 8 L/min FiO2 (%): 44 %  Filed Weights   12/09/21 0432 12/10/21 0412 12/11/21 0500  Weight: 74.6 kg 74.3 kg 74.1 kg     Intake/Output Summary (Last 24 hours) at 12/11/2021 0754 Last data filed at 12/11/2021 [id number] Gross per 24 hour  Intake 960 ml  Output 4050 ml  Net -3090 ml   Net IO Since Admission: -14,692.05 mL [12/11/21 0754]  Physical Exam: Constitutional:Chronically ill appearing gentleman resting comfortably on side of bed, in no acute distress. Cardio:Regular rate and rhythm. No murmurs, rubs, and gallops. Pulm:Bibasilar crackles noted, R>L. Normal work of breathing and comfortable on 6 L HFNC. [id number] for extremity edema. Skin:Warm and dry. Neuro:Alert and oriented x3. No focal deficit noted. Psych:Pleasant mood and affect.  Patient Lines/Drains/Airways Status     Active Line/Drains/Airways     Name Placement date Placement time Site Days   Peripheral IV 12/04/21 20 G Left Antecubital 12/04/21  0810   Antecubital  7             ASSESSMENT/PLAN:  Assessment: Principal Problem:   Acute hypoxemic respiratory failure (HCC) Active Problems:   TOBACCO ABUSE   Claudication in peripheral vascular disease ([place])   Plan: #Acute hypoxemic respiratory failure #Pulmonary edema Patient continues to require increased supplemental O2, currently tolerating 6L HFNC well. He is net -14.7L this admission. He denies symptoms of dyspnea and clinically appears well despite his increased needs. Leukocytosis is stable. -PCCM consulted, appreciate their assistance             -Prednisone 50 mg daily with plan to down-titrate             -Angiotensin-converting enzyme level WNL             -Will need bronch EBUS once oxygen requirements have improved  -Will need outpatient PFTs  -Repeat imaging in 1 year to follow-up on 40mm LLL nodule noted on HRCT chest -Cardiology consulted, appreciate their assistance  -Concern for PAH based on HRCT chest not felt to be contributing to patient's current presentation.   -Continue diuresis and adjust gdmt as tolerated  -Will need outpatient sleep study and V/Q scan -Continue diuresis with IV furosemide 40mg  twice daily -F/u V/Q scan -Wean O2 as tolerated -DuoNebs every 4 hours -Trend WBC -Smoking cessation counseling  #Acute HFpEF (EF 60-65%) Noted during echocardiogram obtained during this admission. Likely  multifactorial, including severe COPD exacerbation and possibly due to paroxysmal a fib. -Cardiology consulted, appreciate their recommendations  -Start Farxiga 10 mg daily, Entresto 24/26 BID   #Paroxysmal atrial fibrillation, CHAD2DS-VAS2c score 3 Patient has remained in NSR since starting diltiazem. He was transitioned to PO 06/07 which he has tolerated well. He continues to be asymptomatic. In discussion regarding starting anticoagulation, I made it clear that this is the recommendation as part of the treatment standard for a fib; the patient would like  to think about starting this medication at this time. -Transition to PO diltiazem 180 mg once daily on 06/09   #Iron deficiency anemia Hgb stable, 10.6 today. -Continue ferrous sulfate 325mg  daily -Trend CBC   #Tobacco use disorder Continue nicotine patch, encouragement for smoking cessation.   #Peripheral vascular disease Cardiology consulted 06/07 regarding concerns for PAH and new paroxysmal a fib. On review of his chart, they have recommended continuation of aspirin but stopping Plavix. Risk stratification labs were also obtained including TSH, lipid panel, HbA1c, which were all WNL. -Continue ASA 81 mg daily   #Hypertension Normotensive at this time. -Continue to hold home antihypertensives  Best Practice: Diet: Regular diet IVF: None VTE: enoxaparin (LOVENOX) injection 40 mg Start: 12/04/21 2200 Code: Full AB: None DISPO: Anticipated discharge pending Medical stability and New oxygen.  Signature: Farrel Gordon, D.O.  Internal Medicine Resident, PGY-1 [facility]  Pager: [phone] 7:54 AM, 12/11/2021   Please contact the on call pager after 5 pm and on weekends at [phone].

## 2021-12-12 ENCOUNTER — Telehealth: Payer: Self-pay | Admitting: Pulmonary Disease

## 2021-12-12 ENCOUNTER — Other Ambulatory Visit (HOSPITAL_COMMUNITY): Payer: Self-pay

## 2021-12-12 ENCOUNTER — Inpatient Hospital Stay (HOSPITAL_COMMUNITY): Payer: No Typology Code available for payment source

## 2021-12-12 LAB — BASIC METABOLIC PANEL
Anion gap: 7 (ref 5–15)
BUN: 19 mg/dL (ref 8–23)
CO2: 25 mmol/L (ref 22–32)
Calcium: 8.7 mg/dL — ABNORMAL LOW (ref 8.9–10.3)
Chloride: 106 mmol/L (ref 98–111)
Creatinine, Ser: 1.07 mg/dL (ref 0.61–1.24)
GFR, Estimated: >60 mL/min (ref >60)
Glucose, Bld: 122 mg/dL — ABNORMAL HIGH (ref 70–99)
Potassium: 4.1 mmol/L (ref 3.5–5.1)
Sodium: 138 mmol/L (ref 135–145)

## 2021-12-12 LAB — CBC
HCT: 39.8 % (ref 39.0–52.0)
Hemoglobin: 11.7 g/dL — ABNORMAL LOW (ref 13.0–17.0)
MCH: 22 pg — ABNORMAL LOW (ref 26.0–34.0)
MCHC: 29.4 g/dL — ABNORMAL LOW (ref 30.0–36.0)
MCV: 74.8 fL — ABNORMAL LOW (ref 80.0–100.0)
Platelets: 444 10*3/uL — ABNORMAL HIGH (ref 150–400)
RBC: 5.32 MIL/uL (ref 4.22–5.81)
RDW: 22.4 % — ABNORMAL HIGH (ref 11.5–15.5)
WBC: 15.5 10*3/uL — ABNORMAL HIGH (ref 4.0–10.5)
nRBC: 0 % (ref 0.0–0.2)

## 2021-12-12 LAB — PROCALCITONIN: Procalcitonin: <0.1 ng/mL

## 2021-12-12 MED ORDER — TECHNETIUM TO 99M ALBUMIN AGGREGATED
4.0000 | Freq: Once | INTRAVENOUS | Status: AC | PRN
  Administered 2021-12-12: 4 via INTRAVENOUS

## 2021-12-12 MED ORDER — FUROSEMIDE 40 MG PO TABS
80.0000 mg | ORAL_TABLET | Freq: Two times a day (BID) | ORAL | Status: DC
  Administered 2021-12-12 – 2021-12-14 (×5): 80 mg via ORAL
  Filled 2021-12-12 (×5): qty 2

## 2021-12-12 MED ORDER — DAPAGLIFLOZIN PROPANEDIOL 10 MG PO TABS
10.0000 mg | ORAL_TABLET | Freq: Every day | ORAL | 2 refills | Status: DC
  Filled 2021-12-12: qty 30, 30d supply, fill #0

## 2021-12-12 MED ORDER — APIXABAN 5 MG PO TABS
5.0000 mg | ORAL_TABLET | Freq: Two times a day (BID) | ORAL | 2 refills | Status: AC
  Filled 2021-12-12: qty 60, 30d supply, fill #0

## 2021-12-12 MED ORDER — DILTIAZEM HCL ER COATED BEADS 180 MG PO CP24
180.0000 mg | ORAL_CAPSULE | Freq: Every day | ORAL | 2 refills | Status: AC
  Filled 2021-12-12: qty 14, 14d supply, fill #0
  Filled 2021-12-30: qty 14, 14d supply, fill #1

## 2021-12-12 MED ORDER — SACUBITRIL-VALSARTAN 24-26 MG PO TABS
1.0000 | ORAL_TABLET | Freq: Two times a day (BID) | ORAL | 2 refills | Status: DC
  Filled 2021-12-12: qty 60, 30d supply, fill #0

## 2021-12-12 MED ORDER — FERROUS SULFATE 325 (65 FE) MG PO TABS
325.0000 mg | ORAL_TABLET | Freq: Every day | ORAL | 2 refills | Status: AC
  Filled 2021-12-12: qty 30, 30d supply, fill #0

## 2021-12-12 MED ORDER — PREDNISONE 10 MG PO TABS
ORAL_TABLET | ORAL | 0 refills | Status: AC
  Filled 2021-12-12: qty 30, 12d supply, fill #0

## 2021-12-12 NOTE — Plan of Care (Signed)

## 2021-12-12 NOTE — Progress Notes (Signed)
Mobility Specialist Progress Note    12/12/21 1023  Mobility  Activity Ambulated independently in hallway  Level of Assistance Standby assist, set-up cues, supervision of patient - no hands on  Assistive Device None  Distance Ambulated (ft) 420 ft  Activity Response Tolerated well  $Mobility charge 1 Mobility   Pre-Mobility: 84 HR During Mobility: 97 HR, 91% SpO2 Post-Mobility: 98 HR, 97% SpO2  Pt received sitting EOB and agreeable. No complaints on walk. Able to maintain SpO2 >/=90% on 6LO2. Returned to sitting EOB with call bell in reach.    Hildred Alamin Mobility Specialist

## 2021-12-12 NOTE — Progress Notes (Signed)
HD#8 SUBJECTIVE:  Patient Summary: Hector [name] is a 66 y.o. male with a history of HTN and PAD presenting with worsening cough and shortness of breath, admitted for acute hypoxemic respiratory failure.  Overnight Events: None  Interim History: Patient appears well this morning and has no complaints. He is tolerating continued weaning of supplemental oxygen well. He is medically stable for discharge once home oxygen is able to be set up.  OBJECTIVE:  Vital Signs: Vitals:   12/12/21 1158 12/12/21 1555 12/12/21 1605 12/12/21 1626  BP: 131/75   102/68  Pulse: 89 90 84 90  Resp: 20 (!) 21 20 20   Temp: 99 F (37.2 C)   97.7 F (36.5 C)  TempSrc: Oral   Oral  SpO2: 90% 94% 93% 93%  Weight:      Height:       Supplemental O2: HFNC SpO2: 93 % O2 Flow Rate (L/min): 6 L/min FiO2 (%): 44 %  Filed Weights   12/10/21 0412 12/11/21 0500 12/12/21 0409  Weight: 74.3 kg 74.1 kg 73.8 kg     Intake/Output Summary (Last 24 hours) at 12/12/2021 1739 Last data filed at 12/12/2021 1700 Gross per 24 hour  Intake 840 ml  Output 2575 ml  Net -1735 ml   Net IO Since Admission: -16,427.05 mL [12/12/21 1739]  Physical Exam: Constitutional:No acute distress. Cardio:Regular rate and rhythm. No murmurs, rubs, and gallops. Pulm:No adverse lung sounds noted. Normal work of breathing and comfortable on 3 L HFNC. [id number] for extremity edema. Skin:Warm and dry. Neuro:Alert and oriented x3. No focal deficit noted. Psych:Pleasant mood and affect.  Patient Lines/Drains/Airways Status     Active Line/Drains/Airways     Name Placement date Placement time Site Days   Peripheral IV 12/11/21 20 G Posterior;Right Forearm 12/11/21  0913  Forearm  1             ASSESSMENT/PLAN:  Assessment: Principal Problem:   Acute hypoxemic respiratory failure (HCC) Active Problems:   TOBACCO ABUSE   Claudication in peripheral vascular disease (HCC)   Plan: #Acute hypoxemic respiratory  failure #Pulmonary edema Supplemental O2 requirements have decreased greatly over the last several days with patient satting mid-90's on 3L HFNC. He maintained SpO2 >90% on no O2 supplementation at rest during our exam briefly but ultimately was placed back on 3L. He is net ~-16.5L this admission. He denies symptoms of dyspnea and clinically appears well. Leukocytosis continues to improve. Procal obtained after repeat CXR 06/08 showed bibasilar infiltrates which was <0.10; no management adjustments were made. V/Q scan obtained 06/09 did not show any perfusion defects. -PCCM consulted, appreciate their assistance  -F/u outpatient for bronch and continued management including PFTs             -Repeat imaging in 1 year to follow-up on 37mm LLL nodule noted on HRCT chest -Cardiology consulted, appreciate their assistance             -Will need outpatient sleep study -Will start down-titration of prednisone 06/10 -PO Lasix 80 mg BID -Wean O2 as tolerated -DuoNebs every 4 hours -Trend WBC -Smoking cessation counseling   #Acute HFpEF (EF 60-65%) Noted during echocardiogram obtained during this admission. Likely multifactorial, including severe COPD exacerbation and possibly due to paroxysmal a fib. -Cardiology consulted, appreciate their recommendations             -Start Farxiga 10 mg daily, Entresto 24/26 BID   #Paroxysmal atrial fibrillation, CHAD2DS-VAS2c score 3 Patient has remained in NSR since starting  diltiazem 06/07. -Continue PO diltiazem 180 mg daily -Start Eliquis 5 mg BID   #Iron deficiency anemia Hgb stable, 11.7 today. -Continue ferrous sulfate 325mg  daily -Trend CBC   #Tobacco use disorder Continue nicotine patch, encouragement for smoking cessation.   #Peripheral vascular disease Cardiology has recommended continuation of aspirin but stopping home Plavix. Risk stratification labs were also obtained including TSH, lipid panel, HbA1c, which were all WNL. -Continue ASA 81 mg  daily   #Hypertension Normotensive at this time. -Continue to hold home antihypertensives  Best Practice: Diet: Regular diet IVF: None VTE: enoxaparin (LOVENOX) injection 40 mg Start: 12/04/21 2200 Code: Full AB: None Therapy Recs: None DISPO: Anticipated discharge to Home pending New oxygen.  Signature: Farrel Gordon, D.O.  Internal Medicine Resident, PGY-1 [facility]  Pager: [phone] 5:39 PM, 12/12/2021   Please contact the on call pager after 5 pm and on weekends at [phone].

## 2021-12-12 NOTE — Progress Notes (Signed)
Mobility Specialist Progress Note    12/12/21 1043  Mobility  Activity Ambulated independently in hallway  Level of Assistance Independent  Assistive Device None  Distance Ambulated (ft) 300 ft  Activity Response Tolerated well  $Mobility charge 1 Mobility   Pre-Mobility: 85 HR, 90% on RA SpO2 During Mobility: HR, 85% on RA SpO2 Post-Mobility: 85 HR, 91% on 2LO2 SpO2  Pt received sitting EOB and agreeable. SpO2 as low as 85% on RA. Able to maintain SpO2 >/=90% on 3LO2 during activity. C/o a little dizziness. Returned to sitting EOB on 2LO2 in room.   [place] Mobility Specialist

## 2021-12-12 NOTE — Telephone Encounter (Signed)
Called and spoke with patient. Patient stated that he was still in the hospital and that he would give [place] a phone call back when he gets out.   Nothing further needed.

## 2021-12-12 NOTE — Progress Notes (Signed)
SATURATION QUALIFICATIONS: (This note is used to comply with regulatory documentation for home oxygen)  Patient Saturations on Room Air at Rest = 90%  Patient Saturations on Room Air while Ambulating = 85%  Patient Saturations on 3 Liters of oxygen while Ambulation = 91%

## 2021-12-12 NOTE — TOC Progression Note (Addendum)
Transition of Care [facility]) - Progression Note    Patient Details  Name: Hector [name] MRN: [id number] Date of Birth: 12/08/1955  Transition of Care [facility]) CM/SW Contact  Beckie Busing, RN Phone [phone]  12/12/2021, 1:48 PM  Clinical Narrative:    [facility] consulted for patient with home O2 needs. CM at bedside to discuss with patient and patient verbalizes understanding. CM has faxed O2 orders to  Home O2 clinic for [facility] [phone] and [phone]. CM called clinic to verify [phone] ext [phone]) that orders have been received but no answer. Voicemail has been left.  1400 Attempted to reach Home Oxygen clinic again with no answer. Second message has been left. CM will have to wait for return call. Orders has been faxed and confirmation received.  1448 CM has attempted to reach Home Oxygen clinic again with no answer. Will await return call.  MD has been updated.   1537 CM attempted to reach Home O2 clinic no answer voicemail left  1542 CM spoke with patient inquire if patient has any secondary insurance in order to expedite O2 orders and delivery. Patient states that he only has [employer]. CM has explained to patient that CM is attempting to get home O2 set up but no response from the [place] at this time. Patient verbalized understanding and voices no concerns.         Expected Discharge Plan and Services                                                 Social Determinants of Health (SDOH) Interventions    Readmission Risk Interventions     No data to display

## 2021-12-12 NOTE — Telephone Encounter (Signed)
Patient being discharged from hospital today  Please schedule for pulmonary follow up  COPD, empysema, mediastinal adenopathy  May be scheduled to see Dr Delton Coombes or APP

## 2021-12-13 DIAGNOSIS — J9601 Acute respiratory failure with hypoxia: Secondary | ICD-10-CM | POA: Diagnosis not present

## 2021-12-13 LAB — BASIC METABOLIC PANEL
Anion gap: 6 (ref 5–15)
BUN: 21 mg/dL (ref 8–23)
CO2: 26 mmol/L (ref 22–32)
Calcium: 8.6 mg/dL — ABNORMAL LOW (ref 8.9–10.3)
Chloride: 106 mmol/L (ref 98–111)
Creatinine, Ser: 1.03 mg/dL (ref 0.61–1.24)
GFR, Estimated: >60 mL/min (ref >60)
Glucose, Bld: 115 mg/dL — ABNORMAL HIGH (ref 70–99)
Potassium: 3.8 mmol/L (ref 3.5–5.1)
Sodium: 138 mmol/L (ref 135–145)

## 2021-12-13 LAB — CBC
HCT: 37.2 % — ABNORMAL LOW (ref 39.0–52.0)
Hemoglobin: 11.4 g/dL — ABNORMAL LOW (ref 13.0–17.0)
MCH: 21.7 pg — ABNORMAL LOW (ref 26.0–34.0)
MCHC: 30.6 g/dL (ref 30.0–36.0)
MCV: 70.7 fL — ABNORMAL LOW (ref 80.0–100.0)
Platelets: 506 10*3/uL — ABNORMAL HIGH (ref 150–400)
RBC: 5.26 MIL/uL (ref 4.22–5.81)
RDW: 22.1 % — ABNORMAL HIGH (ref 11.5–15.5)
WBC: 17.7 10*3/uL — ABNORMAL HIGH (ref 4.0–10.5)
nRBC: 0 % (ref 0.0–0.2)

## 2021-12-13 MED ORDER — PREDNISONE 20 MG PO TABS
40.0000 mg | ORAL_TABLET | Freq: Every day | ORAL | Status: DC
  Administered 2021-12-14: 40 mg via ORAL
  Filled 2021-12-13: qty 2

## 2021-12-13 NOTE — TOC Progression Note (Signed)
Transition of Care [facility]) - Progression Note    Patient Details  Name: Hector [name] MRN: [id number] Date of Birth: [birth year]  Transition of Care [facility]) CM/SW Contact  Tiajuana Amass [place], [place] Phone Number: [phone] 12/13/2021, 1:17 PM  Clinical Narrative:  [facility] team for discharge planning. Spoke to patient telephonically. He is alert, verbally responsive and pleasant. Inquired about oxygen delivery. Patient shares the oxygen has not yet been delivered. Call to the [facility] to no avail. Will attempt again on Monday. Will continue to monitor for discharge needs.          Expected Discharge Plan and Services                                                 Social Determinants of Health (SDOH) Interventions    Readmission Risk Interventions     No data to display

## 2021-12-13 NOTE — Progress Notes (Addendum)
HD#9 SUBJECTIVE:  Patient Summary: Hector [name] is a 66 y.o. male with a history of HTN and PAD presenting with worsening cough and shortness of breath, admitted for acute hypoxemic respiratory failure.  Overnight Events: None  Interim History: Patient appears well this morning and continues to be medically stable for discharge. He understands that we are waiting on [place] authorization for home oxygen, and that once he gets that, he is able to go home.  OBJECTIVE:  Vital Signs: Vitals:   12/12/21 2334 12/13/21 0410 12/13/21 0524 12/13/21 0745  BP: 105/73 118/76  115/76  Pulse: 87 100  94  Resp: (!) 24 16  (!) 24  Temp: 98.8 F (37.1 C) 98.7 F (37.1 C)  98.3 F (36.8 C)  TempSrc: Oral Oral  Oral  SpO2: 92% 93%  94%  Weight:   74.3 kg   Height:       Supplemental O2: HFNC SpO2: 94 % O2 Flow Rate (L/min): 2 L/min FiO2 (%): 44 %  Filed Weights   12/11/21 0500 12/12/21 0409 12/13/21 0524  Weight: 74.1 kg 73.8 kg 74.3 kg     Intake/Output Summary (Last 24 hours) at 12/13/2021 1027 Last data filed at 12/13/2021 0746 Gross per 24 hour  Intake 600 ml  Output 3900 ml  Net -3300 ml   Net IO Since Admission: -19,727.05 mL [12/13/21 1027]  Physical Exam: General: Resting comfortably in no acute distress Pulm: Normal respiratory effort on nasal cannula. Clear to ausculation anterior lung fields. CV: Regular rate, rhythm. No murmurs appreciated. MSK: Normal bulk, tone. No peripheral edema noted. Neuro: Awake, alert, conversing appropriately.   Patient Lines/Drains/Airways Status     Active Line/Drains/Airways     Name Placement date Placement time Site Days   Peripheral IV 12/11/21 20 G Posterior;Right Forearm 12/11/21  0913  Forearm  1             ASSESSMENT/PLAN:  Assessment: Principal Problem:   Acute hypoxemic respiratory failure (HCC) Active Problems:   TOBACCO ABUSE   Claudication in peripheral vascular disease ([place])   Plan: #Acute hypoxemic  respiratory failure #Pulmonary edema Patient continues to tolerate weaning of supplemental oxygen, satting 90's on 2L HFNC. He is down 19.5L this admission. -PCCM consulted, appreciate their assistance  -F/u outpatient for bronch and continued management including PFTs             -Repeat imaging in 1 year to follow-up on 25mm LLL nodule noted on HRCT chest -Cardiology consulted, appreciate their assistance             -Will need outpatient sleep study -Decrease prednisone to 40 mg daily -PO Lasix 80 mg BID -Wean O2 as tolerated -DuoNebs every 4 hours -Trend WBC -Smoking cessation counseling   #Acute HFpEF (EF 60-65%) Noted during echocardiogram obtained during this admission. Likely multifactorial, including severe COPD exacerbation and possibly due to paroxysmal a fib. -Cardiology consulted, appreciate their recommendations             -Continue Farxiga 10 mg daily, Entresto 24/26 BID   #Paroxysmal atrial fibrillation, CHAD2DS-VAS2c score 3 Patient has remained in NSR since starting diltiazem 06/07. -Continue PO diltiazem 180 mg daily -Continue Eliquis 5 mg BID   #Iron deficiency anemia Hgb stable, 11.4 today. Possible that he has a thalassemia trait. -Continue ferrous sulfate 325mg  daily -Trend CBC   #Tobacco use disorder Continue nicotine patch, encouragement for smoking cessation.   #Peripheral vascular disease -Continue ASA 81 mg daily   #Hypertension Normotensive  at this time. -Continue to hold home antihypertensives  Best Practice: Diet: Regular diet IVF: None VTE: enoxaparin (LOVENOX) injection 40 mg Start: 12/04/21 2200 Code: Full AB: None Therapy Recs: None DISPO: Anticipated discharge to Home pending New oxygen.  Signature: Sanjuan Dame, MD Internal Medicine Resident, PGY-2 [facility]  Pager: [phone] 10:27 AM, 12/13/2021   Please contact the on call pager after 5 pm and on weekends at [phone].

## 2021-12-13 NOTE — Progress Notes (Signed)
Mobility Specialist Progress Note    12/13/21 1146  Mobility  Activity Ambulated independently in hallway  Level of Assistance Independent  Assistive Device None  Distance Ambulated (ft) 420 ft  Activity Response Tolerated well  $Mobility charge 1 Mobility   Pre-Mobility: 92 HR, 93% SpO2 Post-Mobility: 96 HR, 92% SpO2  Pt received in bed and agreeable. No complaints on walk. Returned to bed with call bell in reach.    [place] Mobility Specialist

## 2021-12-14 DIAGNOSIS — F17200 Nicotine dependence, unspecified, uncomplicated: Secondary | ICD-10-CM

## 2021-12-14 DIAGNOSIS — D509 Iron deficiency anemia, unspecified: Secondary | ICD-10-CM

## 2021-12-14 DIAGNOSIS — J9601 Acute respiratory failure with hypoxia: Secondary | ICD-10-CM | POA: Diagnosis not present

## 2021-12-14 DIAGNOSIS — I4891 Unspecified atrial fibrillation: Secondary | ICD-10-CM

## 2021-12-14 DIAGNOSIS — I5031 Acute diastolic (congestive) heart failure: Secondary | ICD-10-CM

## 2021-12-14 DIAGNOSIS — I10 Essential (primary) hypertension: Secondary | ICD-10-CM

## 2021-12-14 LAB — CBC
HCT: 40.4 % (ref 39.0–52.0)
Hemoglobin: 12.3 g/dL — ABNORMAL LOW (ref 13.0–17.0)
MCH: 21.8 pg — ABNORMAL LOW (ref 26.0–34.0)
MCHC: 30.4 g/dL (ref 30.0–36.0)
MCV: 71.5 fL — ABNORMAL LOW (ref 80.0–100.0)
Platelets: 518 10*3/uL — ABNORMAL HIGH (ref 150–400)
RBC: 5.65 MIL/uL (ref 4.22–5.81)
RDW: 22.5 % — ABNORMAL HIGH (ref 11.5–15.5)
WBC: 22.3 10*3/uL — ABNORMAL HIGH (ref 4.0–10.5)
nRBC: 0 % (ref 0.0–0.2)

## 2021-12-14 LAB — BASIC METABOLIC PANEL
Anion gap: 12 (ref 5–15)
BUN: 21 mg/dL (ref 8–23)
CO2: 24 mmol/L (ref 22–32)
Calcium: 9 mg/dL (ref 8.9–10.3)
Chloride: 103 mmol/L (ref 98–111)
Creatinine, Ser: 1.17 mg/dL (ref 0.61–1.24)
GFR, Estimated: >60 mL/min (ref >60)
Glucose, Bld: 105 mg/dL — ABNORMAL HIGH (ref 70–99)
Potassium: 3.9 mmol/L (ref 3.5–5.1)
Sodium: 139 mmol/L (ref 135–145)

## 2021-12-14 MED ORDER — PREDNISONE 20 MG PO TABS: 30.0000 mg | ORAL_TABLET | Freq: Every day | ORAL | Status: DC

## 2021-12-14 MED ORDER — APIXABAN 5 MG PO TABS
5.0000 mg | ORAL_TABLET | Freq: Two times a day (BID) | ORAL | Status: DC
  Administered 2021-12-14 – 2021-12-15 (×3): 5 mg via ORAL
  Filled 2021-12-14 (×3): qty 1

## 2021-12-14 MED ORDER — PREDNISONE 20 MG PO TABS
40.0000 mg | ORAL_TABLET | Freq: Every day | ORAL | Status: DC
  Administered 2021-12-15: 40 mg via ORAL
  Filled 2021-12-14: qty 2

## 2021-12-14 MED ORDER — FUROSEMIDE 40 MG PO TABS
80.0000 mg | ORAL_TABLET | Freq: Every day | ORAL | Status: DC
  Administered 2021-12-15: 80 mg via ORAL
  Filled 2021-12-14: qty 2

## 2021-12-14 NOTE — Progress Notes (Signed)
Pt arrived to 5M03 via bed from [place]. Received report from [place], Therapist, sports. Pt alert and oriented. Call bell within reach. VSS.

## 2021-12-14 NOTE — Progress Notes (Addendum)
NAME:  Hector [name], MRN:  [id number], DOB:  23-Dec-1955, LOS: 10 ADMISSION DATE:  12/04/2021  Subjective  Patient evaluated at bedside this AM. Reports no significant change from yesterday. Continues to have mild dyspnea, although markedly improved from admission. Discussed discharge planning.  Objective   Blood pressure 117/73, pulse 92, temperature 98.1 F (36.7 C), temperature source Oral, resp. rate 20, height 5\' 9"  (1.753 m), weight 74.8 kg, SpO2 91 %.     Intake/Output Summary (Last 24 hours) at 12/14/2021 0939 Last data filed at 12/14/2021 0743 Gross per 24 hour  Intake --  Output 3150 ml  Net -3150 ml   Filed Weights   12/12/21 0409 12/13/21 0524 12/14/21 0500  Weight: 73.8 kg 74.3 kg 74.8 kg   Physical Exam: General: Resting in bed in no acute distress CV: Regular rate, rhythm. No murmurs appreciated. Warm extremities. No JVD appreciated. Pulm: Normal respiratory rate on nasal cannula. Clear to ausculation bilaterally. MSK: Normal bulk, tone. No pitting edema bilateral lower extremities. Neuro: Awake, alert, conversing appropriately.  Labs       Latest Ref Rng & Units 12/14/2021    2:47 AM 12/13/2021    3:36 AM 12/12/2021    2:06 AM  CBC  WBC 4.0 - 10.5 K/uL 22.3  17.7  15.5   Hemoglobin 13.0 - 17.0 g/dL 12.3  11.4  11.7   Hematocrit 39.0 - 52.0 % 40.4  37.2  39.8   Platelets 150 - 400 K/uL 518  506  444       Latest Ref Rng & Units 12/14/2021    2:47 AM 12/13/2021    3:36 AM 12/12/2021    2:06 AM  BMP  Glucose 70 - 99 mg/dL 105  115  122   BUN 8 - 23 mg/dL 21  21  19    Creatinine 0.61 - 1.24 mg/dL 1.17  1.03  1.07   Sodium 135 - 145 mmol/L 139  138  138   Potassium 3.5 - 5.1 mmol/L 3.9  3.8  4.1   Chloride 98 - 111 mmol/L 103  106  106   CO2 22 - 32 mmol/L 24  26  25    Calcium 8.9 - 10.3 mg/dL 9.0  8.6  8.7     Summary   Hector [name] is 66yo person with hypertension and peripheral arterial disease admitted 6/1 for acute hypoxic respiratory failure with  likely multi-factorial etiology, including acute heart failure, possibly underlying interstitial disease, now medically stable pending insurance approval for home oxygen.   Assessment & Plan:  Principal Problem:   Acute hypoxemic respiratory failure (HCC) Active Problems:   Claudication in peripheral vascular disease (HCC)   Tobacco use disorder   Acute diastolic heart failure (HCC)   Iron deficiency anemia   Essential hypertension, benign   New onset atrial fibrillation (HCC)  #Acute hypoxic respiratory failure #Acute diastolic heart failure Continues to improve daily, now down to 2L supplemental oxygen this morning. Diuresed well yesterday with BID dosing, 5L urine output. Will decrease to once daily dosing today, appears euvolemic on exam. Will also continue with steroid taper - plan to decrease by 10mg  every three days. Will need outpatient follow-up with pulmonology. Has an appointment with the [facility] on the 23rd, will plan to follow-up with [facility] this week for closer follow-up.  - Diuresis with PO furosemide 80mg  daily - Entresto 24-26mg  twice daily - Farxiga 10mg  daily - Continue steroid taper: prednisone 40mg  day 1/3 - Needs outpatient follow-up with pulmonology  and PFT's - DuoNebs every 6 hours as needed - Discharge pending [facility] for [facility] oxygen  #New-onset paroxysmal atrial fibrillation CHA2DS2-VASC 3. Has maintained normal sinus rhythm with normal rate since converting a few days ago. Will continue with current therapy. - Diltiazem 180mg  daily - Stop lovenox prophylaxis, start DOAC today  #Iron deficiency anemia Received IV iron earlier in admission. Hgb stable. - Daily iron supplementation  #Hypertension Normotensive on Entresto, diltiazem. Will need to discuss d/c'ing home amlodipine with patient prior to discharge.  #Peripheral arterial disease - Aspirin 80mg  daily - Rosuvastatin 20mg  daily  Best practice:  DIET: Regular IVF: n/a DVT PPX: lovenox BOWEL:  Miralax CODE: FULL FAM COM: n/a  Sanjuan Dame, MD Internal Medicine Resident PGY-2 PAGER: [phone] 12/14/2021 9:39 AM  If after hours (below), please contact on-call pager: [phone] 5PM-7AM Monday-Friday 1PM-7AM Saturday-Sunday

## 2021-12-14 NOTE — Discharge Instructions (Signed)

## 2021-12-15 ENCOUNTER — Other Ambulatory Visit (HOSPITAL_COMMUNITY): Payer: Self-pay

## 2021-12-15 LAB — BASIC METABOLIC PANEL
Anion gap: 9 (ref 5–15)
BUN: 23 mg/dL (ref 8–23)
CO2: 26 mmol/L (ref 22–32)
Calcium: 8.8 mg/dL — ABNORMAL LOW (ref 8.9–10.3)
Chloride: 106 mmol/L (ref 98–111)
Creatinine, Ser: 1.26 mg/dL — ABNORMAL HIGH (ref 0.61–1.24)
GFR, Estimated: >60 mL/min (ref >60)
Glucose, Bld: 116 mg/dL — ABNORMAL HIGH (ref 70–99)
Potassium: 3.7 mmol/L (ref 3.5–5.1)
Sodium: 141 mmol/L (ref 135–145)

## 2021-12-15 LAB — CBC
HCT: 37.5 % — ABNORMAL LOW (ref 39.0–52.0)
Hemoglobin: 11.3 g/dL — ABNORMAL LOW (ref 13.0–17.0)
MCH: 21.6 pg — ABNORMAL LOW (ref 26.0–34.0)
MCHC: 30.1 g/dL (ref 30.0–36.0)
MCV: 71.8 fL — ABNORMAL LOW (ref 80.0–100.0)
Platelets: 517 10*3/uL — ABNORMAL HIGH (ref 150–400)
RBC: 5.22 MIL/uL (ref 4.22–5.81)
RDW: 22.5 % — ABNORMAL HIGH (ref 11.5–15.5)
WBC: 19.3 10*3/uL — ABNORMAL HIGH (ref 4.0–10.5)
nRBC: 0 % (ref 0.0–0.2)

## 2021-12-15 NOTE — TOC Progression Note (Signed)
Transition of Care [facility]) - Progression Note    Patient Details  Name: Hector [name] MRN: [id number] Date of Birth: Apr 27, 1956  Transition of Care [facility]) CM/SW Contact  Tom-Johnson, Hershal Coria, RN Phone Number: 12/15/2021, 10:42 AM  Clinical Narrative:     [facility] [phone]) and spoke with Rene Kocher and she states she will notify Marylene Land to return CM's call. CM awaiting return call at this time.        Expected Discharge Plan and Services                                                 Social Determinants of Health (SDOH) Interventions    Readmission Risk Interventions     No data to display

## 2021-12-15 NOTE — Discharge Summary (Incomplete)
Name: Hector [name] MRN: [id number] DOB: [birth year] 66 y.o. PCP: [facility]  Date of Admission: 12/04/2021  7:45 AM Date of Discharge:  6/11/2-23 Attending Physician: Dr. Daryll Drown  DISCHARGE DIAGNOSIS:  Primary Problem: Acute hypoxemic respiratory failure [facility])   Hospital Problems: Principal Problem:   Acute hypoxemic respiratory failure (HCC) Active Problems:   Claudication in peripheral vascular disease ([place])   Tobacco use disorder   Acute diastolic heart failure (HCC)   Iron deficiency anemia   Essential hypertension, benign   New onset atrial fibrillation ([place])    DISCHARGE MEDICATIONS:   Allergies as of 12/15/2021   No Known Allergies      Medication List     STOP taking these medications    amLODipine 10 MG tablet Commonly known as: NORVASC   clopidogrel 75 MG tablet Commonly known as: PLAVIX   lisinopril-hydrochlorothiazide 20-12.5 MG tablet Commonly known as: ZESTORETIC       TAKE these medications    Aspirin Low Dose 81 MG tablet Generic drug: aspirin EC TAKE 1 TABLET BY MOUTH EVERY DAY   Cartia XT 180 MG 24 hr capsule Generic drug: diltiazem Take 1 capsule (180 mg total) by mouth daily.   diclofenac Sodium 1 % Gel Commonly known as: VOLTAREN as needed.   Eliquis 5 MG Tabs tablet Generic drug: apixaban Take 1 tablet (5 mg total) by mouth 2 (two) times daily.   Entresto 24-26 MG Generic drug: sacubitril-valsartan Take 1 tablet by mouth 2 (two) times daily.   etodolac 400 MG tablet Commonly known as: LODINE Take 400 mg by mouth 2 (two) times daily as needed.   Farxiga 10 MG Tabs tablet Generic drug: dapagliflozin propanediol Take 1 tablet (10 mg total) by mouth daily.   FeroSul 325 (65 FE) MG tablet Generic drug: ferrous sulfate Take 1 tablet (325 mg total) by mouth daily with breakfast.   MULTIVITAMIN MEN 50+ PO Take by mouth daily.   omeprazole 20 MG tablet Commonly known as: PRILOSEC OTC Take 20 mg by mouth  daily.   ondansetron 4 MG disintegrating tablet Commonly known as: ZOFRAN-ODT Take 1 tablet (4 mg total) by mouth every 8 (eight) hours as needed for nausea or vomiting.   predniSONE 10 MG tablet Commonly known as: DELTASONE Take 4 tablets (40 mg total) by mouth daily for 3 days, THEN 3 tablets (30 mg total) daily for 3 days, THEN 2 tablets (20 mg total) daily for 3 days, THEN 1 tablet (10 mg total) daily for 3 days. Start taking on: December 13, 2021   rosuvastatin 20 MG tablet Commonly known as: CRESTOR Take 1 tablet (20 mg total) by mouth at bedtime.               Durable Medical Equipment  (From admission, onward)           Start     Ordered   12/12/21 1148  For home use only DME oxygen  Once       Question Answer Comment  Length of Need 6 Months   Mode or (Route) Nasal cannula   Liters per Minute 4   Frequency Continuous (stationary and portable oxygen unit needed)   Oxygen conserving device Yes   Oxygen delivery system Gas      12/12/21 1147            DISPOSITION AND FOLLOW-UP:  Hector [name] was discharged from [facility] in {DISCHARGE CONDITION:19696} condition. At the hospital follow up visit  please address:  #Acute hypoxemic respiratory failure #Pulmonary edema Monitor for signs of overload and worsening respiratory status. Patient will need pulmonology referral for EBUS and continued management, including PFTs. He will need a sleep study as well. In 1 year he will need  repeat imaging in 1 year to follow-up on 75mm LLL nodule noted on HRCT chest.   #Acute HFpEF (EF 60-65%) Ensure patient is tolerating new medications: [place] and Entresto.   #Paroxysmal atrial fibrillation, CHAD2DS-VAS2c score 3 Ensure patient is able to obtain full prescription of diltiazem as he was only able to receive 14d from [place]. Monitor for signs of bleeding with new medication: Eliquis.   #Iron deficiency anemia Recheck CBC. Recheck iron panel  in ***. Last colonoscopy in April 2021 with [facility] though these results were not readily available to our team.   #Tobacco use disorder Encourage smoking cessation.   #Hypertension Recheck BMP. Restart prior home anti-hypertensives if indicated.  Follow-up Recommendations: Consults: Cardiology, Pulmonary medicine, Sleep medicine Labs:  Iron studies**, Basic Metabolic Profile, and CBC Studies: PFTs, sleep study Medications: ***  Follow-up Appointments:  Follow-up Information     Rex Kras, DO Follow up on 12/25/2021.   Specialties: Cardiology, Vascular Surgery Why: 11am Please bring the caridac medication bottles at the next office visit to help facilitate a more accurate medication reconciliation. Contact information: [place] [address] [place] Follow up on 12/22/2021.   Why: You have an appointment on 12/22/2021 at 10:15AM. Contact information: [address] [place] [facility]. Schedule an appointment as soon as possible for a visit in 2 week(s).   Specialty: Pulmonology Why: Please make sure to follow up with our lung doctors. Contact information: [address] [place] [phone] [place]:  Patient Summary: #Acute hypoxemic respiratory failure #Pulmonary edema Patient presented with hypoxia to 72% with inadequate response to [place], requiring placement on NRB. He had mild leukocytosis of 11.4, negative viral panel, BNP slightly elevated at 127, and CXR with small L pleural effusion and L>R airspace opacities. CTA did not show PE but moderate pulmonary edema with L>R pleural effusions and adjacent bibasilar consolidations, as well as evidence of mediastinal and BL hilar lymphadenopathy, possibly reactive. He was started on IV diuresis, nebulizer treatment, and  steroids, and admitted. Echo was obtained which showed ***. Inflammatory markers were WNL. An ABG was collected whigh showed 7.46/35/96 on FiO2 100% with a sifnificantly elevated A-a gradient when calculated with concern for shunting. A bubble study was collected which showed ***. Pulmonology was consulted and recommended restarting high-dose steroids and checking angiotensin-converting enzyme level which was normal. Because of his increased oxygen requirements an EBUS was not able to be completed. ANA, ANCA were tested as well and were ***. HRCT was obtained and showed no evidence of ILD, 3 mm medial LLL nodule with recommended f/u in 12 months, slight decrease in mediastinal adenopathy, enlarged pulmonic trunk suggesting PAH, and emphysema. Cardiology was consulted and did not feel that possible PAH noted on HRCT was contributing to his presentation and recommended continued gdmt with V/Q and sleep studies as outpatient. V/Q scan was ordered and did not reveal any perfusion defects. He was able to slowly titrate down oxygen supplementation  requirements throughout his admission, but will require home oxygen at discharge. Overall he is net -24L. Leukocytosis throughout admission attributed to high-dose steroids was stable, and steroid taper was initiated 06/11.  -F/u outpatient for bronch and continued management including PFTs -Repeat imaging in 1 year to follow-up on 9mm LLL nodule noted on HRCT chest -Will need outpatient sleep study   ***#Acute HFpEF (EF 60-65%) EKG on admission did not show acute ischemic changes and troponins were flat. Echocardiogram obtained early in admission showed ***. Cardiology recommendation of starting Wilder Glade and Delene Loll was implemented.    ***#Paroxysmal atrial fibrillation, CHAD2DS-VAS2c score 3 Overnight 06/06 patient went into AF with HR in the 140s. This was new to this admission and patient has no prior history of arrhythmias. He was started on diltiazem drip and  corrected quickly to NSR. He was transitioned to PO diltiazem 06/07 and started on Eliquis 06/11.     ***#Iron deficiency anemia Admission labs showed microcytic anemia with Hgb of 10.7 and MCV 69 with target cells. He denied episodes of bleeding including in his stool. Iron studies were collected and showed ferritin 21, iron 19, TIBC elevated to 458, 4% saturation. Iron deficit calculated to be 840 mg so he received high dose iron prior to transitioning to daily oral supplementation. Hgb remained stable throughout admission.  -colonoscopy  #Abnormal TSH TSH checked on admission was mildly low. Free T4 was WNL. Thyroid ultrasound was obtained and showed ***. TSH was WNL prior to discharge, deficit likely due to acute illness.   ***#Tobacco use disorder Patient received a nicotine patch and was encouraged to quit smoking.   ***#Peripheral vascular disease Patient was continued on home ASA, rosuvastatin. Cardiology recommended holding Plavix. Risk stratification labs were also obtained prior to discharge including TSH, lipid panel, HbA1c, which were all WNL.   ***#Hypertension Patient was normotensive throughout admission. Orthostatic vital signs where checked and showed ***. Home antihypertensives were discontinued on discharge.   DISCHARGE INSTRUCTIONS:   Discharge Instructions     Diet - low sodium heart healthy   Complete by: As directed    Discharge instructions   Complete by: As directed    Hector [name], I am glad you are feeling better and can be discharged today! You were admitted because your oxygen levels were very low. There were multiple possible reasons for this - please see the notes below.  - One reason for your difficulty breathing was the fluid on your lungs. We believe this was due to heart failure. We were able to pull that fluid off successfully with medications. At your follow-up visit your physician will discuss whether you need to be on a water pill or not.  - We  also believe your difficulty breathing was due to underlying lung disease. It appears you likely have COPD and a disease that causes chronic inflammation in the lungs. You will need to see a pulmonologist for this. The number to call to make an appointment is listed in your discharge paper work.   - While you were hospitalized, your heart was found to have an abnormal rhythm called atrial fibrillation. You will need to take a blood thinner for this called apixaban (Eliquis). This blood thinner helps prevent strokes in the future.  Overall, you have six new medications to take. These are all listed below: 1. Diltiazem (Cardizem) 180mg  once daily. This is for your heart rate and blood pressure. 2. Apixaban (Eliquis) 5mg  twice daily. This is the blood thinner that helps prevent strokes.  3. Sacubitril-valsartan 24-26mg  twice daily. This is for your blood pressure and heart failure. 4. Dapagliflozin propanediol (FARXIGA) 10mg  once daily. This is for your heart failure. 5. Ferrous sulfate 325mg  daily. This is for your low iron 6. Prednisone: This is a steroid to help reduce inflammation. It is a medication that must be slowly tapered off. You were on high dosages while in the hospital. Please see the following schedule:            6/13-6/15: Take prednisone 30mg  daily            6/16-6/18: Take prednisone 20mg  daily            6/19-6/21: Take prednisone 10mg  daily  -There are a few medications we would like for you to STOP taking. These include: Amlodipine 10mg  Clopidogrel 75mg  Lisinopril-hydrochlorothiazide  -Amlodipine and lisinopril-hydrochlorothiazide are blood pressure medications. We have started you on new blood pressure medications that will also help with heart failure. So these old medications are no longer needed. -You were taking clopidogrel for the plaque in your blood vessels. We reviewed the heart doctors' notes from the past, and you do not need to take this any longer. However, you  should keep taking the aspirin daily.  -You have a follow-up appointment at the [facility] on June 19th at 10:15AM. Please arrive 15 minutes prior to appointment time. After this, you can continue seeing the [place] on the 23rd.  - We would also like for you to see a lung doctor (pulmonologist). Their number is listed.  It was a pleasure meeting you, Hector [name]. We wish you the best and hope you stay happy and healthy!   (Say hello to the dogs for [place]!)  Thank you, Sanjuan Dame, MD Farrel Gordon, DO   Increase activity slowly   Complete by: As directed        SUBJECTIVE:  *** Discharge Vitals:   BP 111/62   Pulse 81   Temp 98 F (36.7 C) (Oral)   Resp 19   Ht 5\' 9"  (1.753 m)   Wt 81.2 kg   SpO2 91%   BMI 26.44 kg/m   OBJECTIVE:  Physical Exam   Pertinent Labs, Studies, and Procedures:     Latest Ref Rng & Units 12/15/2021    3:13 AM 12/14/2021    2:47 AM 12/13/2021    3:36 AM  CBC  WBC 4.0 - 10.5 K/uL 19.3  22.3  17.7   Hemoglobin 13.0 - 17.0 g/dL 11.3  12.3  11.4   Hematocrit 39.0 - 52.0 % 37.5  40.4  37.2   Platelets 150 - 400 K/uL 517  518  506        Latest Ref Rng & Units 12/15/2021    3:13 AM 12/14/2021    2:47 AM 12/13/2021    3:36 AM  CMP  Glucose 70 - 99 mg/dL 116  105  115   BUN 8 - 23 mg/dL 23  21  21    Creatinine 0.61 - 1.24 mg/dL 1.26  1.17  1.03   Sodium 135 - 145 mmol/L 141  139  138   Potassium 3.5 - 5.1 mmol/L 3.7  3.9  3.8   Chloride 98 - 111 mmol/L 106  103  106   CO2 22 - 32 mmol/L 26  24  26    Calcium 8.9 - 10.3 mg/dL 8.8  9.0  8.6     ECHOCARDIOGRAM COMPLETE  Result Date: 12/05/2021    ECHOCARDIOGRAM REPORT  Patient Name:   Hector [name] Date of Exam: 12/05/2021 Medical Rec #:  [id number]      Height:       69.0 in Accession #:    [id number]     Weight:       166.7 lb Date of Birth:  Apr 28, 1956      BSA:          1.912 m Patient Age:    59 years       BP:           110/63 mmHg Patient Gender: M              HR:           89  bpm. Exam Location:  Inpatient Procedure: 2D Echo, Color Doppler and Cardiac Doppler Indications:    R06.9 DOE  History:        Patient has no prior history of Echocardiogram examinations.                 Risk Factors:Hypertension and Dyslipidemia.  Sonographer:    Raquel Sarna Senior RDCS Referring Phys: [id number] Alvin Critchley HORTON  Sonographer Comments: Very difficult echo windows due to lung interference, scanned upright due to dyspnea. IMPRESSIONS  1. Left ventricular ejection fraction, by estimation, is 60 to 65%. The left ventricle has normal function. The left ventricle has no regional wall motion abnormalities. Left ventricular diastolic parameters are consistent with Grade I diastolic dysfunction (impaired relaxation).  2. Right ventricular systolic function is normal. The right ventricular size is normal. Tricuspid regurgitation signal is inadequate for assessing PA pressure.  3. The mitral valve is normal in structure. No evidence of mitral valve regurgitation.  4. The aortic valve was not well visualized. Aortic valve regurgitation is not visualized. Mild aortic valve stenosis.  5. The inferior vena cava is dilated in size with <50% respiratory variability, suggesting right atrial pressure of 15 mmHg. Comparison(s): No prior Echocardiogram. FINDINGS  Left Ventricle: Left ventricular ejection fraction, by estimation, is 60 to 65%. The left ventricle has normal function. The left ventricle has no regional wall motion abnormalities. The left ventricular internal cavity size was normal in size. There is  no left ventricular hypertrophy. Left ventricular diastolic parameters are consistent with Grade I diastolic dysfunction (impaired relaxation). Right Ventricle: The right ventricular size is normal. No increase in right ventricular wall thickness. Right ventricular systolic function is normal. Tricuspid regurgitation signal is inadequate for assessing PA pressure. Left Atrium: Left atrial size was normal in size.  Right Atrium: Right atrial size was normal in size. Pericardium: There is no evidence of pericardial effusion. Mitral Valve: The mitral valve is normal in structure. No evidence of mitral valve regurgitation. MV peak gradient, 8.3 mmHg. The mean mitral valve gradient is 4.0 mmHg. Tricuspid Valve: The tricuspid valve is normal in structure. Tricuspid valve regurgitation is not demonstrated. Aortic Valve: The aortic valve was not well visualized. Aortic valve regurgitation is not visualized. Mild aortic stenosis is present. Aortic valve mean gradient measures 12.0 mmHg. Aortic valve peak gradient measures 19.4 mmHg. Aortic valve area, by VTI  measures 3.29 cm. Pulmonic Valve: The pulmonic valve was not well visualized. Pulmonic valve regurgitation is not visualized. Aorta: The aortic root is normal in size and structure. Venous: The inferior vena cava is dilated in size with less than 50% respiratory variability, suggesting right atrial pressure of 15 mmHg. IAS/Shunts: No atrial level shunt detected by color flow Doppler.  LEFT VENTRICLE PLAX 2D LVIDd:  4.70 cm   Diastology LVIDs:         2.70 cm   LV e' medial:    8.05 cm/s LV PW:         0.90 cm   LV E/e' medial:  11.4 LV IVS:        0.90 cm   LV e' lateral:   11.70 cm/s LVOT diam:     2.10 cm   LV E/e' lateral: 7.9 LV SV:         137 LV SV Index:   72 LVOT Area:     3.46 cm  RIGHT VENTRICLE RV S prime:     19.00 cm/s TAPSE (M-mode): 2.3 cm LEFT ATRIUM             Index        RIGHT ATRIUM           Index LA diam:        4.90 cm 2.56 cm/m   RA Area:     13.90 cm LA Vol (A2C):   36.0 ml 18.83 ml/m  RA Volume:   33.00 ml  17.26 ml/m LA Vol (A4C):   58.3 ml 30.49 ml/m LA Biplane Vol: 46.6 ml 24.37 ml/m  AORTIC VALVE AV Area (Vmax):    3.18 cm AV Area (Vmean):   3.06 cm AV Area (VTI):     3.29 cm AV Vmax:           220.00 cm/s AV Vmean:          164.000 cm/s AV VTI:            0.416 m AV Peak Grad:      19.4 mmHg AV Mean Grad:      12.0 mmHg LVOT Vmax:          202.00 cm/s LVOT Vmean:        145.000 cm/s LVOT VTI:          0.395 m LVOT/AV VTI ratio: 0.95  AORTA Ao Root diam: 3.00 cm MITRAL VALVE MV Area (PHT): 3.24 cm     SHUNTS MV Area VTI:   4.38 cm     Systemic VTI:  0.40 m MV Peak grad:  8.3 mmHg     Systemic Diam: 2.10 cm MV Mean grad:  4.0 mmHg MV Vmax:       1.44 m/s MV Vmean:      97.8 cm/s MV Decel Time: 234 msec MV E velocity: 92.10 cm/s MV A velocity: 109.00 cm/s MV E/A ratio:  0.84 PhotographerMary Branch Electronically signed by Carolan ClinesMary Branch Signature Date/Time: 12/05/2021/11:12:42 AM    Final    CT Angio Chest PE W/Cm &/Or Wo Cm  Result Date: 12/04/2021 CLINICAL DATA:  Pulmonary embolism (PE) suspected, high prob EXAM: CT ANGIOGRAPHY CHEST WITH CONTRAST TECHNIQUE: Multidetector CT imaging of the chest was performed using the standard protocol during bolus administration of intravenous contrast. Multiplanar CT image reconstructions and MIPs were obtained to evaluate the vascular anatomy. RADIATION DOSE REDUCTION: This exam was performed according to the departmental dose-optimization program which includes automated exposure control, adjustment of the mA and/or kV according to patient size and/or use of iterative reconstruction technique. CONTRAST:  100mL OMNIPAQUE IOHEXOL 350 MG/ML SOLN COMPARISON:  CT 02/04/2013. FINDINGS: Cardiovascular: Satisfactory opacification of the pulmonary arteries to the segmental level. No evidence of pulmonary embolism. Normal cardiac size.No pericardial disease. Mild atherosclerosis of the thoracic aorta. Mediastinum/Nodes: There is mediastinal and hilar lymphadenopathy. For reference: (Tracheal lymph node  measures 2.0 cm (series 5, image 55). Right hilar lymph node measures 1.5 cm (series 5, image 74). The thyroid is unremarkable. Esophagus is unremarkable. Lungs/Pleura: Mid to apical predominant centrilobular and paraseptal emphysema. There is diffuse interlobular septal thickening and ground-glass opacities. There are small  bilateral pleural effusions, left greater than right, with adjacent basilar consolidations. No pneumothorax. Upper Abdomen: No acute abnormality. Musculoskeletal: No chest wall abnormality. No acute or significant osseous findings. Review of the MIP images confirms the above findings. IMPRESSION: Moderate pulmonary edema with small, left greater than right pleural effusions. Adjacent bibasilar consolidations, favored to be atelectasis. No evidence of pulmonary embolism. Mediastinal and bilateral hilar lymphadenopathy, possibly reactive. Recommend follow-up chest CT after resolution of acute illness in 6-12 weeks. Electronically Signed   By: Maurine Simmering M.D.   On: 12/04/2021 12:23   DG Chest Port 1 View  Result Date: 12/04/2021 CLINICAL DATA:  Shortness of breath EXAM: PORTABLE CHEST 1 VIEW COMPARISON:  Chest x-ray dated October 30, 2021 FINDINGS: Cardiac and mediastinal contours within normal limits. New mild left-greater-than-right airspace opacities. New small left pleural effusion. No evidence of pneumothorax. IMPRESSION: 1. New mild left-greater-than-right airspace opacities, possibly due to pulmonary edema or infection. 2. New small left pleural effusion. Electronically Signed   By: Yetta Glassman M.D.   On: 12/04/2021 08:30     Signed: Farrel Gordon, D.O.  Internal Medicine Resident, PGY-1 [facility]  Pager: [phone] 3:19 PM, 12/15/2021

## 2021-12-15 NOTE — Progress Notes (Signed)
Physical Therapy Treatment Patient Details Name: Hector [name] MRN: [id number] DOB: 22-Dec-1955 Today's Date: 12/15/2021   History of Present Illness Pt is a 65 y.o. male admitted 12/04/21 with cough, SOB requiring NRB and HFNC. Chest CTA with small pleural effusion. TTE 6/2 with grade 1 diastolic dysfunction, mild AS. PMH includes HTN, HLD, arthritis, PVD, gout.    PT Comments    Continuing work on functional mobility and activity tolerance;  Session focused on progressive amb with monitoring O2 sats (see other note of this date for details); Pt did desaturate with exertion on room air to 85%, and needed supplemental O2 to bring sats back up to safe, acceptable levels; still, today needed 6 L of supplemental O2, which is slowly improving compared to previous sessions   Recommendations for follow up therapy are one component of a multi-disciplinary discharge planning process, led by the attending physician.  Recommendations may be updated based on patient status, additional functional criteria and insurance authorization.  Follow Up Recommendations  No PT follow up     Assistance Recommended at Discharge PRN  Patient can return home with the following Assist for transportation   Equipment Recommendations  Other (comment) (Supplemental O2)    Recommendations for Other Services       Precautions / Restrictions Precautions Precautions: Other (comment) Precaution Comments: Watch SpO2 (does not wear baseline)     Mobility  Bed Mobility                    Transfers Overall transfer level: Independent Equipment used: None                    Ambulation/Gait Ambulation/Gait assistance: Supervision Gait Distance (Feet): 400 Feet (greater than) Assistive device: None (occasionally pushing vitals machine) Gait Pattern/deviations: Step-through pattern, Decreased stride length       General Gait Details: slow, steady gait without DME; Reported a feeling of dizziness  concurrent with O2 sat drop   Stairs             Wheelchair Mobility    Modified Rankin (Stroke Patients Only)       Balance Overall balance assessment: No apparent balance deficits (not formally assessed)                                          Cognition Arousal/Alertness: Awake/alert Behavior During Therapy: WFL for tasks assessed/performed Overall Cognitive Status: Within Functional Limits for tasks assessed                                          Exercises      General Comments General comments (skin integrity, edema, etc.): See other PT note of this date for details related to O2 sat walk      Pertinent Vitals/Pain Pain Assessment Pain Assessment: No/denies pain Pain Intervention(s): Monitored during session    Home Living                          Prior Function            PT Goals (current goals can now be found in the care plan section) Acute Rehab PT Goals Patient Stated Goal: return home to dogs PT Goal Formulation: With patient Time  For Goal Achievement: 12/24/21 Potential to Achieve Goals: Good Progress towards PT goals: Progressing toward goals    Frequency    Min 2X/week      PT Plan Current plan remains appropriate    Co-evaluation              AM-PAC PT "6 Clicks" Mobility   Outcome Measure  Help needed turning from your back to your side while in a flat bed without using bedrails?: None Help needed moving from lying on your back to sitting on the side of a flat bed without using bedrails?: None Help needed moving to and from a bed to a chair (including a wheelchair)?: None Help needed standing up from a chair using your arms (e.g., wheelchair or bedside chair)?: None Help needed to walk in hospital room?: None Help needed climbing 3-5 steps with a railing? : A Little 6 Click Score: 23    End of Session Equipment Utilized During Treatment: Oxygen Activity Tolerance:  Patient tolerated treatment well Patient left: in bed;with call bell/phone within reach Nurse Communication: Mobility status PT Visit Diagnosis: Other abnormalities of gait and mobility (R26.89)     Time: [id number] PT Time Calculation (min) (ACUTE ONLY): 23 min  Charges:  $Gait Training: 23-37 mins                     Roney Marion, [place] Office [phone]    Colletta [place] 12/15/2021, 12:09 PM

## 2021-12-15 NOTE — Progress Notes (Signed)
HD#11 SUBJECTIVE:  Patient Summary: Hector [name] is a 66 y.o. male with a history of HTN and PAD presenting with worsening cough and shortness of breath, admitted for acute hypoxemic respiratory failure.  Overnight Events: None  Interim History: Patient is medically stable for discharge pending ability to get home O2. He is eager to go home.  OBJECTIVE:  Vital Signs: Vitals:   12/14/21 2320 12/15/21 0625 12/15/21 0629 12/15/21 0939  BP: 112/66 114/79  111/62  Pulse: 81 69 66 81  Resp: 19 20  19   Temp: 98.5 F (36.9 C) 98 F (36.7 C)    TempSrc: Oral Oral    SpO2: 93%  94% 91%  Weight:      Height:       Supplemental O2: Nasal Cannula SpO2: 91 % O2 Flow Rate (L/min): 6 L/min FiO2 (%): 44 %  Filed Weights   12/13/21 0524 12/14/21 0500 12/14/21 1857  Weight: 74.3 kg 74.8 kg 81.2 kg     Intake/Output Summary (Last 24 hours) at 12/15/2021 1402 Last data filed at 12/15/2021 1225 Gross per 24 hour  Intake 1322.92 ml  Output 2150 ml  Net -827.08 ml   Net IO Since Admission: -24,004.13 mL [12/15/21 1402]  Physical Exam: Constitutional:Resting comfortably, in no acute distress. Cardio:Regular rate and rhythm. No murmurs, rubs, gallops. Pulm:Mild bibasilar crackles. Comfortable on 6L [place]. [id number] for peripheral edema. Neuro:Alert and oriented x3. No focal deficit noted. Psych:Pleasant mood and affect.  Patient Lines/Drains/Airways Status     Active Line/Drains/Airways     Name Placement date Placement time Site Days   Peripheral IV 12/11/21 20 G Posterior;Right Forearm 12/11/21  0913  Forearm  4             ASSESSMENT/PLAN:  Assessment: Principal Problem:   Acute hypoxemic respiratory failure (HCC) Active Problems:   Claudication in peripheral vascular disease (HCC)   Tobacco use disorder   Acute diastolic heart failure (HCC)   Iron deficiency anemia   Essential hypertension, benign   New onset atrial fibrillation ([place])   Plan: #Acute  hypoxemic respiratory failure #Acute HFpEF (EF 60-65%) Patient continues to tolerate weaning of supplemental oxygen, on 6L [place] comfortably. He is down 24L this admission. -PCCM consulted, appreciate their assistance             -F/u outpatient for bronch and continued management including PFTs             -Repeat imaging in 1 year to follow-up on 82mm LLL nodule noted on HRCT chest -Cardiology consulted, appreciate their assistance             -Will need outpatient sleep study -Decrease prednisone to 40 mg daily, day 2/3 -PO Lasix 80 mg daily -Continue Farxiga 10 mg daily, Entresto 24/26 BID -Wean O2 as tolerated -DuoNebs every 4 hours -Trend WBC -Smoking cessation counseling -Medically stable for discharge pending VA approval for home O2   #Paroxysmal atrial fibrillation, CHAD2DS-VAS2c score 3 Patient has remained in NSR since starting diltiazem 06/07. -Continue PO diltiazem 180 mg daily -Continue Eliquis 5 mg BID   #Iron deficiency anemia Hgb stable, 11.3. -Continue ferrous sulfate 325mg  daily -Trend CBC   #Tobacco use disorder Continue nicotine patch, encouragement for smoking cessation.   #Peripheral vascular disease -Continue ASA 81 mg daily -Continue rosuvastatin 20 mg daily   #Hypertension Normotensive at this time. -Continue to hold home antihypertensives; consider holding at discharge  Best Practice: Diet: Regular diet IVF: None DISPO: Anticipated discharge  1-2  to Home pending New oxygen.  Signature: Farrel Gordon, D.O.  Internal Medicine Resident, PGY-1 [facility]  Pager: [phone] 2:02 PM, 12/15/2021   Please contact the on call pager after 5 pm and on weekends at [phone].

## 2021-12-15 NOTE — TOC Transition Note (Signed)
Transition of Care [facility]) - CM/SW Discharge Note   Patient Details  Name: Hector [name] MRN: [id number] Date of Birth: 14-Nov-1955  Transition of Care [facility]) CM/SW Contact:  Tom-Johnson, Hershal Coria, RN Phone Number: 12/15/2021, 3:37 PM   Clinical Narrative:     Patient is scheduled for discharge today. CM received a return call from [place] at the [place] that she has received orders for patient's home Oxygen and she is sending vendors from [facility] to deliver Oxygen tanks at home. Patient made aware and states someone will be at his home to receive Oxygen tanks and the person will bring one with them to pick him up at discharge.  Nurse and MD made aware. Denies any other needs. No further TOC needs noted.    Final next level of care: Home/Self Care Barriers to Discharge: Barriers Resolved   Patient Goals and CMS Choice Patient states their goals for this hospitalization and ongoing recovery are:: To return home CMS Medicare.gov Compare Post Acute Care list provided to:: Patient Choice offered to / list presented to : Patient  Discharge Placement                Patient to be transferred to facility by: Family      Discharge Plan and Services                DME Arranged: Oxygen DME Agency: [facility] [place] Date DME Agency Contacted: 12/15/21 Time DME Agency Contacted: 1012 Representative spoke with at DME Agency: Marylene Land HH Arranged: NA HH Agency: NA        Social Determinants of Health (SDOH) Interventions     Readmission Risk Interventions     No data to display

## 2021-12-15 NOTE — Plan of Care (Signed)
?  Problem: Education: ?Goal: Knowledge of General Education information will improve ?Description: Including pain rating scale, medication(s)/side effects and non-pharmacologic comfort measures ?Outcome: Adequate for Discharge ?  ?Problem: Health Behavior/Discharge Planning: ?Goal: Ability to manage health-related needs will improve ?Outcome: Adequate for Discharge ?  ?Problem: Clinical Measurements: ?Goal: Ability to maintain clinical measurements within normal limits will improve ?Outcome: Adequate for Discharge ?Goal: Will remain free from infection ?Outcome: Adequate for Discharge ?Goal: Diagnostic test results will improve ?Outcome: Adequate for Discharge ?Goal: Respiratory complications will improve ?Outcome: Adequate for Discharge ?Goal: Cardiovascular complication will be avoided ?Outcome: Adequate for Discharge ?  ?Problem: Activity: ?Goal: Risk for activity intolerance will decrease ?Outcome: Adequate for Discharge ?  ?Problem: Nutrition: ?Goal: Adequate nutrition will be maintained ?Outcome: Adequate for Discharge ?  ?Problem: Coping: ?Goal: Level of anxiety will decrease ?Outcome: Adequate for Discharge ?  ?Problem: Elimination: ?Goal: Will not experience complications related to bowel motility ?Outcome: Adequate for Discharge ?Goal: Will not experience complications related to urinary retention ?Outcome: Adequate for Discharge ?  ?Problem: Pain Managment: ?Goal: General experience of comfort will improve ?Outcome: Adequate for Discharge ?  ?Problem: Safety: ?Goal: Ability to remain free from injury will improve ?Outcome: Adequate for Discharge ?  ?Problem: Skin Integrity: ?Goal: Risk for impaired skin integrity will decrease ?Outcome: Adequate for Discharge ?  ?Problem: Acute Rehab PT Goals(only PT should resolve) ?Goal: Pt Will Ambulate ?Outcome: Adequate for Discharge ?Goal: Pt Will Go Up/Down Stairs ?Outcome: Adequate for Discharge ?  ?

## 2021-12-15 NOTE — Progress Notes (Signed)
Physical Therapy Note  (Full PT Treatment note to follow)  SATURATION QUALIFICATIONS: (This note is used to comply with regulatory documentation for home oxygen)  Patient Saturations on Room Air at Rest = 91%, HR 86 bpm  Patient Saturations on Room Air while Ambulating = 85%, HR 103 bpm   Patient Saturations on 6 Liters of oxygen at Rest = 96%, HR 82  Patient Saturations on 6 Liters of oxygen while Ambulating = 96%   Please briefly explain why patient needs home oxygen: Patient requires supplemental oxygen to maintain oxygen saturations at acceptable, safe levels with physical activity.  Van Clines, [place]  Acute Rehabilitation Services Office [phone]

## 2021-12-15 NOTE — Progress Notes (Signed)
DISCHARGE NOTE HOME Hector [name] to be discharged Home per MD order. Discussed prescriptions and follow up appointments with the patient. Prescriptions given to patient; medication list explained in detail. Patient verbalized understanding.  Skin clean, dry and intact without evidence of skin break down, no evidence of skin tears noted. IV catheter discontinued intact. Site without signs and symptoms of complications. Dressing and pressure applied. Pt denies pain at the site currently. No complaints noted.  Patient free of lines, drains, and wounds.   An After Visit Summary (AVS) was printed and given to the patient. Patient escorted via wheelchair, and discharged home via private auto.  Carmin Muskrat, RN

## 2021-12-16 NOTE — Discharge Summary (Cosign Needed Addendum)
Name: Hector [name] MRN: [id number] DOB: 1956-03-27 66 y.o. PCP: [facility]  Date of Admission: 12/04/2021  7:45 AM Date of Discharge: 12/15/2021  Attending Physician: Dr. Criselda Peaches  DISCHARGE DIAGNOSIS:  Primary Problem: Acute hypoxemic respiratory failure [facility])   Hospital Problems: Principal Problem:   Acute hypoxemic respiratory failure (HCC) Active Problems:   Claudication in peripheral vascular disease (HCC)   Tobacco use disorder   Acute diastolic heart failure (HCC)   Iron deficiency anemia   Essential hypertension, benign   New onset atrial fibrillation (HCC)    DISCHARGE MEDICATIONS:   Allergies as of 12/15/2021   No Known Allergies      Medication List     STOP taking these medications    amLODipine 10 MG tablet Commonly known as: NORVASC   clopidogrel 75 MG tablet Commonly known as: PLAVIX   lisinopril-hydrochlorothiazide 20-12.5 MG tablet Commonly known as: ZESTORETIC       TAKE these medications    Aspirin Low Dose 81 MG tablet Generic drug: aspirin EC TAKE 1 TABLET BY MOUTH EVERY DAY   Cartia XT 180 MG 24 hr capsule Generic drug: diltiazem Take 1 capsule (180 mg total) by mouth daily.   diclofenac Sodium 1 % Gel Commonly known as: VOLTAREN as needed.   Eliquis 5 MG Tabs tablet Generic drug: apixaban Take 1 tablet (5 mg total) by mouth 2 (two) times daily.   Entresto 24-26 MG Generic drug: sacubitril-valsartan Take 1 tablet by mouth 2 (two) times daily.   etodolac 400 MG tablet Commonly known as: LODINE Take 400 mg by mouth 2 (two) times daily as needed.   Farxiga 10 MG Tabs tablet Generic drug: dapagliflozin propanediol Take 1 tablet (10 mg total) by mouth daily.   FeroSul 325 (65 FE) MG tablet Generic drug: ferrous sulfate Take 1 tablet (325 mg total) by mouth daily with breakfast.   MULTIVITAMIN MEN 50+ PO Take by mouth daily.   omeprazole 20 MG tablet Commonly known as: PRILOSEC OTC Take 20 mg by mouth  daily.   ondansetron 4 MG disintegrating tablet Commonly known as: ZOFRAN-ODT Take 1 tablet (4 mg total) by mouth every 8 (eight) hours as needed for nausea or vomiting.   predniSONE 10 MG tablet Commonly known as: DELTASONE Take 4 tablets (40 mg total) by mouth daily for 3 days, THEN 3 tablets (30 mg total) daily for 3 days, THEN 2 tablets (20 mg total) daily for 3 days, THEN 1 tablet (10 mg total) daily for 3 days. Start taking on: December 13, 2021   rosuvastatin 20 MG tablet Commonly known as: CRESTOR Take 1 tablet (20 mg total) by mouth at bedtime.        DISPOSITION AND FOLLOW-UP:  Hector [name] was discharged from [facility] in Stable condition. At the hospital follow up visit please address:  #Acute hypoxemic respiratory failure #Pulmonary edema Recheck CBC to monitor for improvement of leukocytosis. Assess supplemental O2 needs. He will need outpatient follow-up with pulmonology for EBUS and PFTs, as well as repeat imaging in 1 year to follow-up on 71mm LLL nodule noted on HRCT chest. He will also need outpatient follow-up for sleep study. Patient would benefit from rheumatology follow-up for ANA and staining pattern results. Please follow-up for reflex of these results.   #Acute HFpEF (EF 60-65%) Monitor volume status and restart diuretic if felt necessary.   #Paroxysmal atrial fibrillation, CHAD2DS-VAS2c score 3 Monitor for continued NSR.   #Iron deficiency anemia Recheck iron  studies after acute inflammatory state is over. Consider requesting results of colonoscopy from April 2021.   #Tobacco use disorder Encouraged to quit smoking.   #Hypertension Restart home antihypertensives if indicated  Follow-up Recommendations: Consults: Cardiology, Pulmonary medicine, Sleep Medicine Labs:  Follow-up ANA reflex studies and CBC Studies: Sleep study, PFTs, HRCT in 1 year. Medications:   STOP taking: Amlodipine Clopidogrel Lisinopril-HCTZ START  taking: Diltiazem Eliquis Farxiga EntrestoI Iron supplement Prednisone taper   Follow-up Appointments:  Follow-up Information     Rex Kras, DO Follow up on 12/25/2021.   Specialties: Cardiology, Vascular Surgery Why: 11am Please bring the caridac medication bottles at the next office visit to help facilitate a more accurate medication reconciliation. Contact information: [place] [address] [place] Follow up on 12/22/2021.   Why: You have an appointment on 12/22/2021 at 10:15AM. Contact information: [address] [facility]. Schedule an appointment as soon as possible for a visit in 2 week(s).   Specialty: Pulmonology Why: Please make sure to follow up with our lung doctors. Contact information: [address] [id number] [place]:  Patient Summary: #Acute hypoxemic respiratory failure #Pulmonary edema Patient presented with hypoxia to 72% with inadequate response to [place], requiring placement on NRB. He had mild leukocytosis of 11.4, negative viral panel, BNP slightly elevated at 127, and CXR with small L pleural effusion and L>R airspace opacities. CTA did not show PE but moderate pulmonary edema with L>R pleural effusions and adjacent bibasilar consolidations, as well as evidence of mediastinal and BL hilar lymphadenopathy, possibly reactive. He was started on IV diuresis, nebulizer treatment, and steroids, and admitted. Echo was obtained which showed LF EF 60-65% with normal function and no wall motion abnormalities. Inflammatory markers were WNL. An ABG was collected whigh showed 7.46/35/96 on FiO2 100% with a sifnificantly elevated A-a gradient when calculated with concern for shunting. A bubble study was done and was negative. Pulmonology was  consulted and recommended restarting high-dose steroids and checking angiotensin-converting enzyme level which was normal. Because of his increased oxygen requirements an EBUS was not able to be completed. ANA was unremarkable however ANA was positive with  speckled pattern which could indicate SLE,  Sjogrens Syndrome, mixed connective tissue disease. HRCT was obtained and showed no evidence of ILD, 3 mm medial LLL nodule with recommended f/u in 12 months, slight decrease in mediastinal adenopathy, enlarged pulmonic trunk suggesting PAH, and emphysema. Cardiology was consulted and did not feel that possible PAH noted on HRCT was contributing to his presentation and recommended continued gdmt with V/Q and sleep studies as outpatient. V/Q scan was ordered and did not reveal any perfusion defects. He was able to slowly titrate down oxygen supplementation requirements throughout his admission, but will require home oxygen at discharge. Overall he is net -24L. Leukocytosis throughout admission attributed to high-dose steroids was stable, and steroid taper was initiated 06/11.   #Acute HFpEF (EF 60-65%) EKG on admission did not show acute ischemic changes and troponins were flat. Echocardiogram obtained early in admission showed LF EF 60-65% with normal function and no wall motion abnormalities. Cardiology recommendation of starting Wilder Glade and Delene Loll was implemented.    #Paroxysmal atrial fibrillation, CHAD2DS-VAS2c score 3 Overnight  06/06 patient went into AF with HR in the 140s. This was new to this admission and patient has no prior history of arrhythmias. He was started on diltiazem drip and corrected quickly to NSR. He was transitioned to PO diltiazem 06/07 and started on Eliquis 06/11.     #Iron deficiency anemia Admission labs showed microcytic anemia with Hgb of 10.7 and MCV 69 with target cells. He denied episodes of bleeding including in his stool. Iron studies were collected and showed ferritin 21,  iron 19, TIBC elevated to 458, 4% saturation. Iron deficit calculated to be 840 mg so he received high dose iron prior to transitioning to daily oral supplementation. Hgb remained stable throughout admission.  #Abnormal TSH TSH checked on admission was mildly low. Free T4 was WNL. Thyroid ultrasound was obtained and showed tiny hypoechoic nodules or cysts in the L thyroid lobe that do not meet criteria for biopsy or dedicated follow-up, as well as prominent lymph node in R supraclavicular area and prominent mediastinal and hilar LN on recent chest CT. TSH was WNL prior to discharge, deficit likely due to acute illness.   #Tobacco use disorder Patient received a nicotine patch and was encouraged to quit smoking.   #Peripheral vascular disease Patient was continued on home ASA, rosuvastatin. Cardiology recommended holding Plavix. Risk stratification labs were also obtained prior to discharge including TSH, lipid panel, HbA1c, which were all WNL.   #Hypertension Patient was normotensive throughout admission. Home antihypertensives were discontinued on discharge.   DISCHARGE INSTRUCTIONS:   Discharge Instructions     Diet - low sodium heart healthy   Complete by: As directed    Discharge instructions   Complete by: As directed    Hector [name], I am glad you are feeling better and can be discharged today! You were admitted because your oxygen levels were very low. There were multiple possible reasons for this - please see the notes below.  - One reason for your difficulty breathing was the fluid on your lungs. We believe this was due to heart failure. We were able to pull that fluid off successfully with medications. At your follow-up visit your physician will discuss whether you need to be on a water pill or not.  - We also believe your difficulty breathing was due to underlying lung disease. It appears you likely have COPD and a disease that causes chronic inflammation in the lungs. You will  need to see a pulmonologist for this. The number to call to make an appointment is listed in your discharge paper work.   - While you were hospitalized, your heart was found to have an abnormal rhythm called atrial fibrillation. You will need to take a blood thinner for this called apixaban (Eliquis). This blood thinner helps prevent strokes in the future.  Overall, you have six new medications to take. These are all listed below: 1. Diltiazem (Cardizem) 180mg  once daily. This is for your heart rate and blood pressure. 2. Apixaban (Eliquis) 5mg  twice daily. This is the blood thinner that helps prevent strokes. 3. Sacubitril-valsartan 24-26mg  twice daily. This is for your blood pressure and heart failure. 4. Dapagliflozin propanediol (FARXIGA) 10mg  once daily. This is for your heart failure. 5. Ferrous sulfate 325mg  daily. This is for your low iron 6. Prednisone: This is a steroid to help reduce inflammation. It is a medication that must be slowly tapered off. You were on high dosages while in the hospital. Please see the following schedule:  6/13-6/15: Take prednisone 30mg  daily            6/16-6/18: Take prednisone 20mg  daily            6/19-6/21: Take prednisone 10mg  daily  -There are a few medications we would like for you to STOP taking. These include: Amlodipine 10mg  Clopidogrel 75mg  Lisinopril-hydrochlorothiazide  -Amlodipine and lisinopril-hydrochlorothiazide are blood pressure medications. We have started you on new blood pressure medications that will also help with heart failure. So these old medications are no longer needed. -You were taking clopidogrel for the plaque in your blood vessels. We reviewed the heart doctors' notes from the past, and you do not need to take this any longer. However, you should keep taking the aspirin daily.  -You have a follow-up appointment at the [facility] on June 19th at 10:15AM. Please arrive 15 minutes prior to appointment  time. After this, you can continue seeing the [place] on the 23rd.  - We would also like for you to see a lung doctor (pulmonologist). Their number is listed.  It was a pleasure meeting you, Hector [name]. We wish you the best and hope you stay happy and healthy!   (Say hello to the dogs for [place]!)  Thank you, Sanjuan Dame, MD Farrel Gordon, DO   Increase activity slowly   Complete by: As directed        SUBJECTIVE:  Patient evaluated at bedside on day of discharge. He continues to do well with down titration of supplemental oxygen and is eager to go home, understanding that he will need close follow-up with our clinic in the time between discharge and [place] follow-up appointment.  Discharge Vitals:   BP 111/73   Pulse 76   Temp 97.7 F (36.5 C)   Resp 19   Ht 5\' 9"  (1.753 m)   Wt 81.2 kg   SpO2 97%   BMI 26.44 kg/m   OBJECTIVE:  Constitutional:Resting comfortably, in no acute distress. Cardio:Regular rate and rhythm. No murmurs, rubs, gallops. Pulm:Mild bibasilar crackles. Comfortable on 6L [employer]. [id number] for peripheral edema. Neuro:Alert and oriented x3. No focal deficit noted. Psych:Pleasant mood and affect.   Pertinent Labs, Studies, and Procedures:     Latest Ref Rng & Units 12/15/2021    3:13 AM 12/14/2021    2:47 AM 12/13/2021    3:36 AM  CBC  WBC 4.0 - 10.5 K/uL 19.3  22.3  17.7   Hemoglobin 13.0 - 17.0 g/dL 11.3  12.3  11.4   Hematocrit 39.0 - 52.0 % 37.5  40.4  37.2   Platelets 150 - 400 K/uL 517  518  506        Latest Ref Rng & Units 12/15/2021    3:13 AM 12/14/2021    2:47 AM 12/13/2021    3:36 AM  CMP  Glucose 70 - 99 mg/dL 116  105  115   BUN 8 - 23 mg/dL 23  21  21    Creatinine 0.61 - 1.24 mg/dL 1.26  1.17  1.03   Sodium 135 - 145 mmol/L 141  139  138   Potassium 3.5 - 5.1 mmol/L 3.7  3.9  3.8   Chloride 98 - 111 mmol/L 106  103  106   CO2 22 - 32 mmol/L 26  24  26    Calcium 8.9 - 10.3 mg/dL 8.8  9.0  8.6     ECHOCARDIOGRAM COMPLETE  Result Date:  12/05/2021    ECHOCARDIOGRAM REPORT   Patient Name:  [name] Date of Exam: 12/05/2021 Medical Rec #:  [id number]      Height:       69.0 in Accession #:    [id number]     Weight:       166.7 lb Date of Birth:  02-05-1956      BSA:          1.912 m Patient Age:    75 years       BP:           110/63 mmHg Patient Gender: M              HR:           89 bpm. Exam Location:  Inpatient Procedure: 2D Echo, Color Doppler and Cardiac Doppler Indications:    R06.9 DOE  History:        Patient has no prior history of Echocardiogram examinations.                 Risk Factors:Hypertension and Dyslipidemia.  Sonographer:    Raquel Sarna Senior RDCS Referring Phys: [id number] Alvin Critchley HORTON  Sonographer Comments: Very difficult echo windows due to lung interference, scanned upright due to dyspnea. IMPRESSIONS  1. Left ventricular ejection fraction, by estimation, is 60 to 65%. The left ventricle has normal function. The left ventricle has no regional wall motion abnormalities. Left ventricular diastolic parameters are consistent with Grade I diastolic dysfunction (impaired relaxation).  2. Right ventricular systolic function is normal. The right ventricular size is normal. Tricuspid regurgitation signal is inadequate for assessing PA pressure.  3. The mitral valve is normal in structure. No evidence of mitral valve regurgitation.  4. The aortic valve was not well visualized. Aortic valve regurgitation is not visualized. Mild aortic valve stenosis.  5. The inferior vena cava is dilated in size with <50% respiratory variability, suggesting right atrial pressure of 15 mmHg. Comparison(s): No prior Echocardiogram. FINDINGS  Left Ventricle: Left ventricular ejection fraction, by estimation, is 60 to 65%. The left ventricle has normal function. The left ventricle has no regional wall motion abnormalities. The left ventricular internal cavity size was normal in size. There is  no left ventricular hypertrophy. Left ventricular diastolic  parameters are consistent with Grade I diastolic dysfunction (impaired relaxation). Right Ventricle: The right ventricular size is normal. No increase in right ventricular wall thickness. Right ventricular systolic function is normal. Tricuspid regurgitation signal is inadequate for assessing PA pressure. Left Atrium: Left atrial size was normal in size. Right Atrium: Right atrial size was normal in size. Pericardium: There is no evidence of pericardial effusion. Mitral Valve: The mitral valve is normal in structure. No evidence of mitral valve regurgitation. MV peak gradient, 8.3 mmHg. The mean mitral valve gradient is 4.0 mmHg. Tricuspid Valve: The tricuspid valve is normal in structure. Tricuspid valve regurgitation is not demonstrated. Aortic Valve: The aortic valve was not well visualized. Aortic valve regurgitation is not visualized. Mild aortic stenosis is present. Aortic valve mean gradient measures 12.0 mmHg. Aortic valve peak gradient measures 19.4 mmHg. Aortic valve area, by VTI  measures 3.29 cm. Pulmonic Valve: The pulmonic valve was not well visualized. Pulmonic valve regurgitation is not visualized. Aorta: The aortic root is normal in size and structure. Venous: The inferior vena cava is dilated in size with less than 50% respiratory variability, suggesting right atrial pressure of 15 mmHg. IAS/Shunts: No atrial level shunt detected by color flow Doppler.  LEFT VENTRICLE PLAX 2D LVIDd:  4.70 cm   Diastology LVIDs:         2.70 cm   LV e' medial:    8.05 cm/s LV PW:         0.90 cm   LV E/e' medial:  11.4 LV IVS:        0.90 cm   LV e' lateral:   11.70 cm/s LVOT diam:     2.10 cm   LV E/e' lateral: 7.9 LV SV:         137 LV SV Index:   72 LVOT Area:     3.46 cm  RIGHT VENTRICLE RV S prime:     19.00 cm/s TAPSE (M-mode): 2.3 cm LEFT ATRIUM             Index        RIGHT ATRIUM           Index LA diam:        4.90 cm 2.56 cm/m   RA Area:     13.90 cm LA Vol (A2C):   36.0 ml 18.83 ml/m  RA  Volume:   33.00 ml  17.26 ml/m LA Vol (A4C):   58.3 ml 30.49 ml/m LA Biplane Vol: 46.6 ml 24.37 ml/m  AORTIC VALVE AV Area (Vmax):    3.18 cm AV Area (Vmean):   3.06 cm AV Area (VTI):     3.29 cm AV Vmax:           220.00 cm/s AV Vmean:          164.000 cm/s AV VTI:            0.416 m AV Peak Grad:      19.4 mmHg AV Mean Grad:      12.0 mmHg LVOT Vmax:         202.00 cm/s LVOT Vmean:        145.000 cm/s LVOT VTI:          0.395 m LVOT/AV VTI ratio: 0.95  AORTA Ao Root diam: 3.00 cm MITRAL VALVE MV Area (PHT): 3.24 cm     SHUNTS MV Area VTI:   4.38 cm     Systemic VTI:  0.40 m MV Peak grad:  8.3 mmHg     Systemic Diam: 2.10 cm MV Mean grad:  4.0 mmHg MV Vmax:       1.44 m/s MV Vmean:      97.8 cm/s MV Decel Time: 234 msec MV E velocity: 92.10 cm/s MV A velocity: 109.00 cm/s MV E/A ratio:  0.84 Landscape architect signed by Phineas Inches Signature Date/Time: 12/05/2021/11:12:42 AM    Final    CT Angio Chest PE W/Cm &/Or Wo Cm  Result Date: 12/04/2021 CLINICAL DATA:  Pulmonary embolism (PE) suspected, high prob EXAM: CT ANGIOGRAPHY CHEST WITH CONTRAST TECHNIQUE: Multidetector CT imaging of the chest was performed using the standard protocol during bolus administration of intravenous contrast. Multiplanar CT image reconstructions and MIPs were obtained to evaluate the vascular anatomy. RADIATION DOSE REDUCTION: This exam was performed according to the departmental dose-optimization program which includes automated exposure control, adjustment of the mA and/or kV according to patient size and/or use of iterative reconstruction technique. CONTRAST:  166mL OMNIPAQUE IOHEXOL 350 MG/ML SOLN COMPARISON:  CT 02/04/2013. FINDINGS: Cardiovascular: Satisfactory opacification of the pulmonary arteries to the segmental level. No evidence of pulmonary embolism. Normal cardiac size.No pericardial disease. Mild atherosclerosis of the thoracic aorta. Mediastinum/Nodes: There is mediastinal and hilar lymphadenopathy. For  reference: (Tracheal lymph  node measures 2.0 cm (series 5, image 55). Right hilar lymph node measures 1.5 cm (series 5, image 74). The thyroid is unremarkable. Esophagus is unremarkable. Lungs/Pleura: Mid to apical predominant centrilobular and paraseptal emphysema. There is diffuse interlobular septal thickening and ground-glass opacities. There are small bilateral pleural effusions, left greater than right, with adjacent basilar consolidations. No pneumothorax. Upper Abdomen: No acute abnormality. Musculoskeletal: No chest wall abnormality. No acute or significant osseous findings. Review of the MIP images confirms the above findings. IMPRESSION: Moderate pulmonary edema with small, left greater than right pleural effusions. Adjacent bibasilar consolidations, favored to be atelectasis. No evidence of pulmonary embolism. Mediastinal and bilateral hilar lymphadenopathy, possibly reactive. Recommend follow-up chest CT after resolution of acute illness in 6-12 weeks. Electronically Signed   By: Maurine Simmering M.D.   On: 12/04/2021 12:23   DG Chest Port 1 View  Result Date: 12/04/2021 CLINICAL DATA:  Shortness of breath EXAM: PORTABLE CHEST 1 VIEW COMPARISON:  Chest x-ray dated October 30, 2021 FINDINGS: Cardiac and mediastinal contours within normal limits. New mild left-greater-than-right airspace opacities. New small left pleural effusion. No evidence of pneumothorax. IMPRESSION: 1. New mild left-greater-than-right airspace opacities, possibly due to pulmonary edema or infection. 2. New small left pleural effusion. Electronically Signed   By: Yetta Glassman M.D.   On: 12/04/2021 08:30     Signed: Farrel Gordon, D.O.  Internal Medicine Resident, PGY-1 [facility]  Pager: [phone] 9:26 AM, 12/16/2021

## 2021-12-17 ENCOUNTER — Ambulatory Visit (INDEPENDENT_AMBULATORY_CARE_PROVIDER_SITE_OTHER): Payer: No Typology Code available for payment source | Admitting: Nurse Practitioner

## 2021-12-17 ENCOUNTER — Other Ambulatory Visit (HOSPITAL_COMMUNITY): Payer: Self-pay

## 2021-12-17 ENCOUNTER — Encounter: Payer: Self-pay | Admitting: Nurse Practitioner

## 2021-12-17 VITALS — BP 104/56 | HR 101 | Temp 98.1°F | Ht 69.0 in | Wt 176.4 lb

## 2021-12-17 DIAGNOSIS — J432 Centrilobular emphysema: Secondary | ICD-10-CM | POA: Insufficient documentation

## 2021-12-17 DIAGNOSIS — I4891 Unspecified atrial fibrillation: Secondary | ICD-10-CM | POA: Diagnosis not present

## 2021-12-17 DIAGNOSIS — F17200 Nicotine dependence, unspecified, uncomplicated: Secondary | ICD-10-CM

## 2021-12-17 DIAGNOSIS — I5031 Acute diastolic (congestive) heart failure: Secondary | ICD-10-CM | POA: Diagnosis not present

## 2021-12-17 DIAGNOSIS — R0609 Other forms of dyspnea: Secondary | ICD-10-CM

## 2021-12-17 DIAGNOSIS — Z720 Tobacco use: Secondary | ICD-10-CM | POA: Diagnosis not present

## 2021-12-17 DIAGNOSIS — J9601 Acute respiratory failure with hypoxia: Secondary | ICD-10-CM

## 2021-12-17 DIAGNOSIS — D72829 Elevated white blood cell count, unspecified: Secondary | ICD-10-CM

## 2021-12-17 MED ORDER — ALBUTEROL SULFATE HFA 108 (90 BASE) MCG/ACT IN AERS: 2.0000 | INHALATION_SPRAY | Freq: Four times a day (QID) | RESPIRATORY_TRACT | 2 refills | Status: DC | PRN

## 2021-12-17 MED ORDER — STIOLTO RESPIMAT 2.5-2.5 MCG/ACT IN AERS: 2.0000 | INHALATION_SPRAY | Freq: Every day | RESPIRATORY_TRACT | 5 refills | Status: DC

## 2021-12-17 MED ORDER — STIOLTO RESPIMAT 2.5-2.5 MCG/ACT IN AERS: 2.0000 | INHALATION_SPRAY | Freq: Every day | RESPIRATORY_TRACT | 0 refills | Status: DC

## 2021-12-17 NOTE — Assessment & Plan Note (Signed)
Acute on chronic diastolic HF identified during recent hospitalization with concern for pulmonary edema. Responded well to diuretics. Follow up with cardiology.

## 2021-12-17 NOTE — Assessment & Plan Note (Signed)
Likely driven by high dose steroids; now on prednisone taper. Clinically improved. Will plan to recheck at follow up once he is off prednisone.

## 2021-12-17 NOTE — Assessment & Plan Note (Addendum)
Progressive DOE over the last 7-8 months, recently admitted for acute respiratory failure of unknown etiology. Emphysema and smoking related respiratory bronchiolitis on CT scan; initially believed to be having an AECOPD but workup was not consistent with this given little improvement s/p steroids/abx. Questioned underlying inflammatory ILD but inflammatory workup was negative; ACE was negative as well. Did have newly identified diastolic dysfunction and possible pulmonary edema with good response to diuretics. Also question PAH but echo inconclusive for this. May need outpatient right heart cath. PFTs ordered for further evaluation. Split night study ordered. EBUS recommended during hospitalization once hypoxia resolved - follow up scheduled with Dr. Lamonte Sakai for further evaluation.  Patient Instructions  Start Stiolto 2 puffs daily. This is your new maintenance inhaler  Albuterol inhaler 2 puffs every 6 hours as needed for shortness of breath or wheezing. Notify if symptoms persist despite rescue inhaler/neb use.  Complete prednisone taper as previously prescribed   Pulmonary function testing scheduled   Split night sleep study - someone will contact you for scheduling  Referral to cardiology placed   Follow up with Dr. Lamonte Sakai after PFTs to discuss possible bronchoscopy. If symptoms do not improve or worsen, please contact office for sooner follow up or seek emergency care.

## 2021-12-17 NOTE — Assessment & Plan Note (Signed)
Lung nodules and enlarged mediastinal nodes on CT. Referred to lung cancer screening program today. Currently has no desire to quit smoking - we discussed that number one treatment for RB ILD is to quit smoking.

## 2021-12-17 NOTE — Assessment & Plan Note (Signed)
Clinically improved. He was able to maintain oxygen in the 90's during our visit on room air. Does have O2 available at home still. Recommended he monitor for goal >88-90%. Continue nocturnal use until split night completed.

## 2021-12-17 NOTE — Patient Instructions (Addendum)
Start Stiolto 2 puffs daily. This is your new maintenance inhaler  Albuterol inhaler 2 puffs every 6 hours as needed for shortness of breath or wheezing. Notify if symptoms persist despite rescue inhaler/neb use.  Complete prednisone taper as previously prescribed   Pulmonary function testing scheduled   Split night sleep study - someone will contact you for scheduling  Referral to cardiology placed   Follow up with Dr. Delton Coombes after PFTs to discuss possible bronchoscopy. If symptoms do not improve or worsen, please contact office for sooner follow up or seek emergency care.

## 2021-12-17 NOTE — Progress Notes (Signed)
_0  ID: Hector [name], male    DOB: April 02, 1956, 66 y.o.   MRN: [id number]  Chief Complaint  Patient presents with   Follow-up    Follow up. Patient says things are going okay since the hospital.     Referring provider: [facility]  HPI: 66 year old male, current everyday smoker followed for emphysema and acute respiratory failure.  Past medical history significant for diastolic heart failure, hypertension, A-fib, GERD, OA, EtOH abuse, IDA.  He was recently admitted to the hospital from 12/04/2021 to 12/15/2021, during which time PCCM was consulted.  He went into the ED with shortness of breath and cough.  He had markedly elevated O2 need up to 20 L high flow nasal cannula.  Etiology for acute hypoxemia was not entirely clear.  He had had progressive DOE and cough over the last 7 to 8 months.  CTA and perfusion scans negative for PE. Questioned possible AECOPD with infiltrates initially; however work-up was not consistent with this.  Infectious work-up was unremarkable with negative RVP, afebrile and PCT within normal limits.  Underwent inflammatory work-up which was also negative-CRP, ESR and ACE were all normal.  He did have evidence of respiratory bronchiolitis on his HRCT.  He also had diastolic dysfunction on echo, which was new, and ended up being treated for subtle findings of pulmonary edema and acute diastolic failure with good response to Lasix. Also with new onet a fib and started on Eliquis and cardizem. Bubble study without evidence of shunt. Questioned possible underlying etiology of PAH; however, unable to measure on echo. May need to consider right heart cath outpatient.   He underwent HRCT during his stay with showed enlarged mediastinal lymph nodes, up to 1.6 cm. They were noted to be decreased when compared to his 6/1 scan. There was evidence of emphysema and smoking related respiratory bronchiolitis. He also had two nodules, one 4 mm and stable when compared to 2014 and  one 3 mm, previously not seen. He also had an enlarged pulmonic trunk. There was no other evidence of ILD. It was recommended that he undergo EBUS, PFTs, and sleep study outpatient for further evaluation of his DOE, cough and O2 requirement. Discharged on prednisone taper.   TEST/EVENTS:  12/05/2021 echocardiogram: EF 60 to 65%.  G1 DD.  RV size and function was normal.  Unable to measure PASP.  Mild AS. 12/07/2021 echocardiogram bubble study: No evidence of shunt.  Left bowing of anterior atrial septum, suggestive of elevated right atrial pressure. 12/09/2021 HRCT: Atherosclerosis.  Enlarged pulmonary trunk.  Mediastinal lymph nodes measure up to 1.6 cm in the AP window, decreased from 2 cm on 12/04/2021 and similar to 04/06/2013.  Centrilobular and paraseptal emphysema is present.  There are smoking-related respiratory bronchiolitis.  The 4 mm nodule along the right major fissure is unchanged from 2014 and considered benign.  There is a 3 mm medial left lower lobe nodule which is difficult to compare to 2014 12/13/2018 pulmonary perfusion: No segmental perfusion defects identified; there is no evidence of pulmonary embolism  12/17/2021: Today-follow-up Patient presents today for hospital follow-up.  He is new to the pulmonary clinic and most recently seen by Dr. Ander Slade during his hospital stay.  He has had progressive DOE and minimally productive cough over the last 7 to 8 months.  Cough is primarily in the morning but does occur occasionally throughout the day. He does have chronic fatigue symptoms; wakes feeling like he didn't rest well. He denies any leg swelling,  wheezing, hemoptysis, weight loss, fevers, night sweats.  He does continue to smoke and does not have any desire to quit currently.  He has never been on any maintenance inhaler regimen.  Has not had any issues with palpitations since being discharged on Cardizem and Eliquis.  He does not have any visit scheduled with cardiology.   No Known  Allergies  Immunization History  Administered Date(s) Administered   Fluad Quad(high Dose 65+) 05/02/2021   PFIZER(Purple Top)SARS-COV-2 Vaccination 08/31/2019, 10/19/2019   Zoster Recombinat (Shingrix) 05/02/2021, 08/21/2021    Past Medical History:  Diagnosis Date   Arthritis    Claudication in peripheral vascular disease ([place]) 09/05/2018   GERD (gastroesophageal reflux disease)    Gout    L knee   Hyperlipidemia    Hypertension     Tobacco History: Social History   Tobacco Use  Smoking Status Every Day   Packs/day: 1.00   Years: 39.00   Total pack years: 39.00   Types: Cigarettes  Smokeless Tobacco Never   Ready to quit: Not Answered Counseling given: Not Answered   Outpatient Medications Prior to Visit  Medication Sig Dispense Refill   apixaban (ELIQUIS) 5 MG TABS tablet Take 1 tablet (5 mg total) by mouth 2 (two) times daily. 60 tablet 2   ASPIRIN LOW DOSE 81 MG EC tablet TAKE 1 TABLET BY MOUTH EVERY DAY 90 tablet 2   dapagliflozin propanediol (FARXIGA) 10 MG TABS tablet Take 1 tablet (10 mg total) by mouth daily. 30 tablet 2   diclofenac Sodium (VOLTAREN) 1 % GEL as needed.     diltiazem (CARDIZEM CD) 180 MG 24 hr capsule Take 1 capsule (180 mg total) by mouth daily. 30 capsule 2   etodolac (LODINE) 400 MG tablet Take 400 mg by mouth 2 (two) times daily as needed.     ferrous sulfate 325 (65 FE) MG tablet Take 1 tablet (325 mg total) by mouth daily with breakfast. 30 tablet 2   Multiple Vitamins-Minerals (MULTIVITAMIN MEN 50+ PO) Take by mouth daily.     omeprazole (PRILOSEC OTC) 20 MG tablet Take 20 mg by mouth daily.     ondansetron (ZOFRAN-ODT) 4 MG disintegrating tablet Take 1 tablet (4 mg total) by mouth every 8 (eight) hours as needed for nausea or vomiting. 20 tablet 0   predniSONE (DELTASONE) 10 MG tablet Take 4 tablets (40 mg total) by mouth daily for 3 days, THEN 3 tablets (30 mg total) daily for 3 days, THEN 2 tablets (20 mg total) daily for 3 days, THEN  1 tablet (10 mg total) daily for 3 days. 30 tablet 0   sacubitril-valsartan (ENTRESTO) 24-26 MG Take 1 tablet by mouth 2 (two) times daily. 60 tablet 2   rosuvastatin (CRESTOR) 20 MG tablet Take 1 tablet (20 mg total) by mouth at bedtime. 90 tablet 0   No facility-administered medications prior to visit.     Review of Systems:   Constitutional: No weight loss or gain, night sweats, fevers, chills. +fatigue HEENT: No headaches, difficulty swallowing, tooth/dental problems, or sore throat. No sneezing, itching, ear ache, nasal congestion, or post nasal drip CV:  No chest pain, orthopnea, PND, swelling in lower extremities, anasarca, dizziness, palpitations, syncope Resp: +shortness of breath with exertion; minimally productive cough, mainly in the AM. No excess mucus or change in color of mucus. No hemoptysis. No wheezing.  No chest wall deformity GI:  No heartburn, indigestion, abdominal pain, nausea, vomiting, diarrhea, change in bowel habits, loss of appetite, bloody  stools.  Skin: No rash, lesions, ulcerations MSK:  No joint pain or swelling.  No decreased range of motion.  No back pain. Neuro: No dizziness or lightheadedness.  Psych: No depression or anxiety. Mood stable.     Physical Exam:  BP (!) 104/56 (BP Location: Right Arm, Patient Position: Sitting, Cuff Size: Normal)   Pulse (!) 101   Temp 98.1 F (36.7 C) (Oral)   Ht $R'5\' 9"'tc$  (1.753 m)   Wt 176 lb 6.4 oz (80 kg)   SpO2 91%   BMI 26.05 kg/m   GEN: Pleasant, interactive, well-appearing; in no acute distress. HEENT:  Normocephalic and atraumatic. EACs patent bilaterally. TM pearly gray with present light reflex bilaterally. PERRLA. Sclera white. Nasal turbinates pink, moist and patent bilaterally. No rhinorrhea present. Oropharynx pink and moist, without exudate or edema. No lesions, ulcerations, or postnasal drip.  NECK:  Supple w/ fair ROM. No JVD present. Normal carotid impulses w/o bruits. Thyroid symmetrical with no  goiter or nodules palpated. No lymphadenopathy.   CV: RRR, no m/r/g, no peripheral edema. Pulses intact, +2 bilaterally. No cyanosis, pallor or clubbing. PULMONARY:  Unlabored, regular breathing. Clear bilaterally A&P w/o wheezes/rales/rhonchi. No accessory muscle use. No dullness to percussion. GI: BS present and normoactive. Soft, non-tender to palpation. No organomegaly or masses detected. No CVA tenderness. MSK: No erythema, warmth or tenderness. Cap refil <2 sec all extrem. No deformities or joint swelling noted.  Neuro: A/Ox3. No focal deficits noted.   Skin: Warm, no lesions or rashe Psych: Normal affect and behavior. Judgement and thought content appropriate.     Lab Results:  CBC    Component Value Date/Time   WBC 19.3 (H) 12/15/2021 0313   RBC 5.22 12/15/2021 0313   HGB 11.3 (L) 12/15/2021 0313   HGB 16.1 08/29/2018 0957   HCT 37.5 (L) 12/15/2021 0313   HCT 48.7 08/29/2018 0957   PLT 517 (H) 12/15/2021 0313   PLT 303 08/29/2018 0957   MCV 71.8 (L) 12/15/2021 0313   MCV 83 08/29/2018 0957   MCH 21.6 (L) 12/15/2021 0313   MCHC 30.1 12/15/2021 0313   RDW 22.5 (H) 12/15/2021 0313   RDW 12.6 08/29/2018 0957   LYMPHSABS 1.3 12/04/2021 0810   LYMPHSABS 2.1 08/29/2018 0957   MONOABS 0.3 12/04/2021 0810   EOSABS 0.3 12/04/2021 0810   EOSABS 0.4 08/29/2018 0957   BASOSABS 0.1 12/04/2021 0810   BASOSABS 0.1 08/29/2018 0957    BMET    Component Value Date/Time   NA 141 12/15/2021 0313   NA 140 01/09/2020 0933   K 3.7 12/15/2021 0313   CL 106 12/15/2021 0313   CO2 26 12/15/2021 0313   GLUCOSE 116 (H) 12/15/2021 0313   BUN 23 12/15/2021 0313   BUN 14 01/09/2020 0933   CREATININE 1.26 (H) 12/15/2021 0313   CREATININE 0.83 03/17/2014 0926   CALCIUM 8.8 (L) 12/15/2021 0313   GFRNONAA >60 12/15/2021 0313   GFRAA 88 01/09/2020 0933    BNP    Component Value Date/Time   BNP 218.1 (H) 12/09/2021 0139     Imaging:  NM Pulmonary Perfusion  Result Date:  12/12/2021 CLINICAL DATA:  Chronic dyspnea EXAM: NUCLEAR MEDICINE PERFUSION LUNG SCAN TECHNIQUE: Perfusion images were obtained in multiple projections after intravenous injection of radiopharmaceutical. Ventilation scans intentionally deferred if perfusion scan and chest x-ray adequate for interpretation during COVID 19 epidemic. RADIOPHARMACEUTICALS:  4.0 mCi Tc-60m MAA IV COMPARISON:  CT December 04, 2021. FINDINGS: No segmental perfusion defects identified. Radiotracer  uptake is largely homogeneous throughout the bilateral lung fields. IMPRESSION: No scintigraphic evidence of pulmonary embolus. Electronically Signed   By: Dahlia Bailiff M.D.   On: 12/12/2021 10:24   DG CHEST PORT 1 VIEW  Result Date: 12/11/2021 CLINICAL DATA:  Cough and shortness of breath EXAM: PORTABLE CHEST 1 VIEW COMPARISON:  12/17/2021 FINDINGS: Heart and mediastinal shadows are normal. There are worsened infiltrates in both lower lobes. The upper lungs remain clear. No dense consolidation or lobar collapse. No effusion. IMPRESSION: Worsening of bilateral lower lobe pneumonia. Electronically Signed   By: Nelson Chimes M.D.   On: 12/11/2021 15:27   CT Chest High Resolution  Result Date: 12/10/2021 CLINICAL DATA:  Hypoxemia, shortness of breath. EXAM: CT CHEST WITHOUT CONTRAST TECHNIQUE: Multidetector CT imaging of the chest was performed following the standard protocol without intravenous contrast. High resolution imaging of the lungs, as well as inspiratory and expiratory imaging, was performed. RADIATION DOSE REDUCTION: This exam was performed according to the departmental dose-optimization program which includes automated exposure control, adjustment of the mA and/or kV according to patient size and/or use of iterative reconstruction technique. COMPARISON:  12/04/2021, 04/06/2013. FINDINGS: Cardiovascular: Atherosclerotic calcification of the aorta and aortic valve. Enlarged pulmonic trunk. Heart size normal. No pericardial effusion.  Mediastinum/Nodes: Mediastinal lymph nodes measure up to 1.6 cm in the AP window, decreased from 2.0 cm on 12/04/2021, and similar to 04/06/2013. Hilar regions are difficult to evaluate without IV contrast but appear full. No axillary adenopathy. Esophagus is grossly unremarkable. Small hiatal hernia. Lungs/Pleura: Centrilobular and paraseptal emphysema. Smoking related respiratory bronchiolitis. Negative for subpleural reticulation, traction bronchiectasis/bronchiolectasis, ground-glass, architectural distortion or honeycombing. 4 mm nodule along the right major fissure (5/74), unchanged from 04/06/2013 and benign. 3 mm medial left lower lobe nodule, difficult to compare with 04/06/2013 due to respiratory motion and expiratory phase imaging. No pleural fluid. Airway is unremarkable. No air trapping. Upper Abdomen: Visualized portions of the liver, adrenal glands, kidneys, spleen, pancreas, stomach and bowel are grossly unremarkable with the exception of an aforementioned small hiatal hernia. No upper abdominal adenopathy. Musculoskeletal: No worrisome lytic or sclerotic lesions. IMPRESSION: 1. No evidence of interstitial lung disease. 2. 3 mm medial left lower lobe nodule. Although likely benign, as patient is high-risk, non-contrast chest CT can be considered in 12 months. This could be done in conjunction with annual lung cancer screening.This recommendation follows the consensus statement: Guidelines for Management of Incidental Pulmonary Nodules Detected on CT Images: From the Fleischner Society 2017; Radiology 2017; 284:228-243. 3. Slight decrease in mediastinal adenopathy, now similar to 04/06/2013. 4. Low-dose CT lung cancer screening is recommended for patients who are 61-31 years of age with a 20+ pack-year history of smoking, and who are currently smoking or quit <=15 years ago. 5.  Aortic atherosclerosis (ICD10-I70.0). 6. Enlarged pulmonic trunk, indicative of pulmonary arterial hypertension. 7.   Emphysema (ICD10-J43.9). Electronically Signed   By: Lorin Picket M.D.   On: 12/10/2021 08:47   ECHOCARDIOGRAM LIMITED BUBBLE STUDY  Result Date: 12/07/2021    ECHOCARDIOGRAM LIMITED REPORT   Patient Name:   YUYA VANWINGERDEN Date of Exam: 12/07/2021 Medical Rec #:  [id number]      Height:       69.0 in Accession #:    [id number]     Weight:       165.1 lb Date of Birth:  29-Sep-1955      BSA:          1.904 m Patient Age:  65 years       BP:           122/75 mmHg Patient Gender: M              HR:           73 bpm. Exam Location:  Inpatient Procedure: Limited Echo and Saline Contrast Bubble Study Indications:    Q21.1 Patent foramen [place]  History:        Patient has prior history of Echocardiogram examinations, most                 recent 12/05/2021. Abnormal ECG, Arrythmias:Tachycardia,                 Signs/Symptoms:Shortness of Breath and Dyspnea; Risk                 Factors:Current Smoker. ETOH.  Sonographer:    Roseanna Rainbow RDCS Referring Phys: [place]  Sonographer Comments: Image acquisition challenging due to respiratory motion. IMPRESSIONS  1. Agitated saline contrast bubble study was negative, with no evidence of any interatrial shunt. FINDINGS  IAS/Shunts: There is left bowing of the interatrial septum, suggestive of elevated right atrial pressure. Agitated saline contrast was given intravenously to evaluate for intracardiac shunting. Agitated saline contrast bubble study was negative, with no  evidence of any interatrial shunt. Sanda Klein MD Electronically signed by Sanda Klein MD Signature Date/Time: 12/07/2021/3:56:19 PM    Final    DG CHEST PORT 1 VIEW  Result Date: 12/07/2021 CLINICAL DATA:  Hypoxemia.  Shortness of breath. EXAM: PORTABLE CHEST 1 VIEW COMPARISON:  December 04, 2021 FINDINGS: The patchy pulmonary opacities identified previously have almost completely resolved. Minimal opacity in left base has the appearance of atelectasis. Stable cardiomegaly. The hila and mediastinum are  stable. No overt edema. Mild pulmonary venous congestion not excluded. No pneumothorax. IMPRESSION: Mild opacity in left base is significantly improved, favored represent atelectasis. No other acute abnormalities. Electronically Signed   By: Dorise Bullion III M.D.   On: 12/07/2021 13:12   US THYROID  Result Date: 12/06/2021 CLINICAL DATA:  Thyromegaly. EXAM: THYROID ULTRASOUND TECHNIQUE: Ultrasound examination of the thyroid gland and adjacent soft tissues was performed. COMPARISON:  Chest CT 12/04/2021 FINDINGS: Parenchymal Echotexture: Normal Isthmus: 0.5 cm Right lobe: 5.3 x 2.0 x 2.2 cm Left lobe: 4.8 x 1.4 x 1.8 cm _________________________________________________________ Estimated total number of nodules >/= 1 cm: 0 Number of spongiform nodules >/=  2 cm not described below (TR1): 0 Number of mixed cystic and solid nodules >/= 1.5 cm not described below ([place]): 0 _________________________________________________________ No discrete right thyroid nodules. Tiny hypoechoic nodules in the left thyroid lobe, largest measures 0.3 cm. These tiny left thyroid nodules do not meet criteria for biopsy or dedicated follow-up. Prominent lymph node in the right supraclavicular region on sequence 6, image 163. Lymph node measures up to 0.9 cm in the short axis. IMPRESSION: 1. Normal appearance of the thyroid tissue. Tiny hypoechoic nodules or cysts in the left thyroid lobe do not meet criteria for biopsy or dedicated follow-up. 2. Prominent lymph node in the right supraclavicular region. Patient also has prominent mediastinal and hilar lymph nodes on recent chest CT. This lymphadenopathy is indeterminate. The above is in keeping with the ACR TI-RADS recommendations - J Am Coll Radiol 2017;14:587-595. Electronically Signed   By: Markus Daft M.D.   On: 12/06/2021 14:46   ECHOCARDIOGRAM COMPLETE  Result Date: 12/05/2021    ECHOCARDIOGRAM REPORT  Patient Name:   Hector [name] Date of Exam: 12/05/2021 Medical Rec #:   [id number]      Height:       69.0 in Accession #:    [id number]     Weight:       166.7 lb Date of Birth:  1955/11/14      BSA:          1.912 m Patient Age:    38 years       BP:           110/63 mmHg Patient Gender: M              HR:           89 bpm. Exam Location:  Inpatient Procedure: 2D Echo, Color Doppler and Cardiac Doppler Indications:    R06.9 DOE  History:        Patient has no prior history of Echocardiogram examinations.                 Risk Factors:Hypertension and Dyslipidemia.  Sonographer:    Raquel Sarna Senior RDCS Referring Phys: [id number] Alvin Critchley [place]  Sonographer Comments: Very difficult echo windows due to lung interference, scanned upright due to dyspnea. IMPRESSIONS  1. Left ventricular ejection fraction, by estimation, is 60 to 65%. The left ventricle has normal function. The left ventricle has no regional wall motion abnormalities. Left ventricular diastolic parameters are consistent with Grade I diastolic dysfunction (impaired relaxation).  2. Right ventricular systolic function is normal. The right ventricular size is normal. Tricuspid regurgitation signal is inadequate for assessing PA pressure.  3. The mitral valve is normal in structure. No evidence of mitral valve regurgitation.  4. The aortic valve was not well visualized. Aortic valve regurgitation is not visualized. Mild aortic valve stenosis.  5. The inferior vena cava is dilated in size with <50% respiratory variability, suggesting right atrial pressure of 15 mmHg. Comparison(s): No prior Echocardiogram. FINDINGS  Left Ventricle: Left ventricular ejection fraction, by estimation, is 60 to 65%. The left ventricle has normal function. The left ventricle has no regional wall motion abnormalities. The left ventricular internal cavity size was normal in size. There is  no left ventricular hypertrophy. Left ventricular diastolic parameters are consistent with Grade I diastolic dysfunction (impaired relaxation). Right Ventricle: The right  ventricular size is normal. No increase in right ventricular wall thickness. Right ventricular systolic function is normal. Tricuspid regurgitation signal is inadequate for assessing PA pressure. Left Atrium: Left atrial size was normal in size. Right Atrium: Right atrial size was normal in size. Pericardium: There is no evidence of pericardial effusion. Mitral Valve: The mitral valve is normal in structure. No evidence of mitral valve regurgitation. MV peak gradient, 8.3 mmHg. The mean mitral valve gradient is 4.0 mmHg. Tricuspid Valve: The tricuspid valve is normal in structure. Tricuspid valve regurgitation is not demonstrated. Aortic Valve: The aortic valve was not well visualized. Aortic valve regurgitation is not visualized. Mild aortic stenosis is present. Aortic valve mean gradient measures 12.0 mmHg. Aortic valve peak gradient measures 19.4 mmHg. Aortic valve area, by VTI  measures 3.29 cm. Pulmonic Valve: The pulmonic valve was not well visualized. Pulmonic valve regurgitation is not visualized. Aorta: The aortic root is normal in size and structure. Venous: The inferior vena cava is dilated in size with less than 50% respiratory variability, suggesting right atrial pressure of 15 mmHg. IAS/Shunts: No atrial level shunt detected by color flow Doppler.  LEFT VENTRICLE PLAX 2D LVIDd:  4.70 cm   Diastology LVIDs:         2.70 cm   LV e' medial:    8.05 cm/s LV PW:         0.90 cm   LV E/e' medial:  11.4 LV IVS:        0.90 cm   LV e' lateral:   11.70 cm/s LVOT diam:     2.10 cm   LV E/e' lateral: 7.9 LV SV:         137 LV SV Index:   72 LVOT Area:     3.46 cm  RIGHT VENTRICLE RV S prime:     19.00 cm/s TAPSE (M-mode): 2.3 cm LEFT ATRIUM             Index        RIGHT ATRIUM           Index LA diam:        4.90 cm 2.56 cm/m   RA Area:     13.90 cm LA Vol (A2C):   36.0 ml 18.83 ml/m  RA Volume:   33.00 ml  17.26 ml/m LA Vol (A4C):   58.3 ml 30.49 ml/m LA Biplane Vol: 46.6 ml 24.37 ml/m  AORTIC  VALVE AV Area (Vmax):    3.18 cm AV Area (Vmean):   3.06 cm AV Area (VTI):     3.29 cm AV Vmax:           220.00 cm/s AV Vmean:          164.000 cm/s AV VTI:            0.416 m AV Peak Grad:      19.4 mmHg AV Mean Grad:      12.0 mmHg LVOT Vmax:         202.00 cm/s LVOT Vmean:        145.000 cm/s LVOT VTI:          0.395 m LVOT/AV VTI ratio: 0.95  AORTA Ao Root diam: 3.00 cm MITRAL VALVE MV Area (PHT): 3.24 cm     SHUNTS MV Area VTI:   4.38 cm     Systemic VTI:  0.40 m MV Peak grad:  8.3 mmHg     Systemic Diam: 2.10 cm MV Mean grad:  4.0 mmHg MV Vmax:       1.44 m/s MV Vmean:      97.8 cm/s MV Decel Time: 234 msec MV E velocity: 92.10 cm/s MV A velocity: 109.00 cm/s MV E/A ratio:  0.84 Landscape architect signed by Phineas Inches Signature Date/Time: 12/05/2021/11:12:42 AM    Final    CT Angio Chest PE W/Cm &/Or Wo Cm  Result Date: 12/04/2021 CLINICAL DATA:  Pulmonary embolism (PE) suspected, high prob EXAM: CT ANGIOGRAPHY CHEST WITH CONTRAST TECHNIQUE: Multidetector CT imaging of the chest was performed using the standard protocol during bolus administration of intravenous contrast. Multiplanar CT image reconstructions and MIPs were obtained to evaluate the vascular anatomy. RADIATION DOSE REDUCTION: This exam was performed according to the departmental dose-optimization program which includes automated exposure control, adjustment of the mA and/or kV according to patient size and/or use of iterative reconstruction technique. CONTRAST:  130m OMNIPAQUE IOHEXOL 350 MG/ML SOLN COMPARISON:  CT 02/04/2013. FINDINGS: Cardiovascular: Satisfactory opacification of the pulmonary arteries to the segmental level. No evidence of pulmonary embolism. Normal cardiac size.No pericardial disease. Mild atherosclerosis of the thoracic aorta. Mediastinum/Nodes: There is mediastinal and hilar lymphadenopathy. For reference: (Tracheal lymph node  measures 2.0 cm (series 5, image 55). Right hilar lymph node measures 1.5 cm  (series 5, image 74). The thyroid is unremarkable. Esophagus is unremarkable. Lungs/Pleura: Mid to apical predominant centrilobular and paraseptal emphysema. There is diffuse interlobular septal thickening and ground-glass opacities. There are small bilateral pleural effusions, left greater than right, with adjacent basilar consolidations. No pneumothorax. Upper Abdomen: No acute abnormality. Musculoskeletal: No chest wall abnormality. No acute or significant osseous findings. Review of the MIP images confirms the above findings. IMPRESSION: Moderate pulmonary edema with small, left greater than right pleural effusions. Adjacent bibasilar consolidations, favored to be atelectasis. No evidence of pulmonary embolism. Mediastinal and bilateral hilar lymphadenopathy, possibly reactive. Recommend follow-up chest CT after resolution of acute illness in 6-12 weeks. Electronically Signed   By: Maurine Simmering M.D.   On: 12/04/2021 12:23   DG Chest Port 1 View  Result Date: 12/04/2021 CLINICAL DATA:  Shortness of breath EXAM: PORTABLE CHEST 1 VIEW COMPARISON:  Chest x-ray dated October 30, 2021 FINDINGS: Cardiac and mediastinal contours within normal limits. New mild left-greater-than-right airspace opacities. New small left pleural effusion. No evidence of pneumothorax. IMPRESSION: 1. New mild left-greater-than-right airspace opacities, possibly due to pulmonary edema or infection. 2. New small left pleural effusion. Electronically Signed   By: Yetta Glassman M.D.   On: 12/04/2021 08:30          No data to display          No results found for: "NITRICOXIDE"      Assessment & Plan:   DOE (dyspnea on exertion) Progressive DOE over the last 7-8 months, recently admitted for acute respiratory failure of unknown etiology. Emphysema and smoking related respiratory bronchiolitis on CT scan; initially believed to be having an AECOPD but workup was not consistent with this given little improvement s/p  steroids/abx. Questioned underlying inflammatory ILD but inflammatory workup was negative; ACE was negative as well. Did have newly identified diastolic dysfunction and possible pulmonary edema with good response to diuretics. Also question PAH but echo inconclusive for this. May need outpatient right heart cath. PFTs ordered for further evaluation. Split night study ordered. EBUS recommended during hospitalization once hypoxia resolved - follow up scheduled with Dr. Lamonte Sakai for further evaluation.  Patient Instructions  Start Stiolto 2 puffs daily. This is your new maintenance inhaler  Albuterol inhaler 2 puffs every 6 hours as needed for shortness of breath or wheezing. Notify if symptoms persist despite rescue inhaler/neb use.  Complete prednisone taper as previously prescribed   Pulmonary function testing scheduled   Split night sleep study - someone will contact you for scheduling  Referral to cardiology placed   Follow up with Dr. Lamonte Sakai after PFTs to discuss possible bronchoscopy. If symptoms do not improve or worsen, please contact office for sooner follow up or seek emergency care.    Centrilobular emphysema (HCC) Significant smoking history with evidence of emphysema on CT scan. Suspect underlying COPD contributing to [place]. Recommended starting Stiolto daily and PRN albuterol. We will obtain PFTs for further evaluation.   Acute hypoxemic respiratory failure (HCC) Clinically improved. He was able to maintain oxygen in the 90's during our visit on room air. Does have O2 available at home still. Recommended he monitor for goal >88-90%. Continue nocturnal use until split night completed.   New onset atrial fibrillation ([place]) New onset a fib during his hospitalization. Likely caused by hypoxic failure. Rate controlled with regular rhythm today on Cardizem. Anticoagulated on Eliquis. He does not have a cardiologist  currently and no hospital f/u scheduled - referred to cardiology today.    Acute diastolic heart failure (HCC) Acute on chronic diastolic HF identified during recent hospitalization with concern for pulmonary edema. Responded well to diuretics. Follow up with cardiology.  Tobacco use disorder Lung nodules and enlarged mediastinal nodes on CT. Referred to lung cancer screening program today. Currently has no desire to quit smoking - we discussed that number one treatment for RB ILD is to quit smoking.   Leukocytosis Likely driven by high dose steroids; now on prednisone taper. Clinically improved. Will plan to recheck at follow up once he is off prednisone.    I spent 45 minutes of dedicated to the care of this patient on the date of this encounter to include pre-visit review of records, face-to-face time with the patient discussing conditions above, post visit ordering of testing, clinical documentation with the electronic health record, making appropriate referrals as documented, and communicating necessary findings to members of the patients care team.  Clayton Bibles, NP 12/17/2021  Pt aware and understands NP's role.

## 2021-12-17 NOTE — Assessment & Plan Note (Signed)
New onset a fib during his hospitalization. Likely caused by hypoxic failure. Rate controlled with regular rhythm today on Cardizem. Anticoagulated on Eliquis. He does not have a cardiologist currently and no hospital f/u scheduled - referred to cardiology today.

## 2021-12-17 NOTE — Assessment & Plan Note (Addendum)
Significant smoking history with evidence of emphysema on CT scan. Suspect underlying COPD contributing to DOE. Recommended starting Stiolto daily and PRN albuterol. We will obtain PFTs for further evaluation.

## 2021-12-22 ENCOUNTER — Ambulatory Visit (INDEPENDENT_AMBULATORY_CARE_PROVIDER_SITE_OTHER): Payer: No Typology Code available for payment source | Admitting: Student

## 2021-12-22 VITALS — BP 115/70 | HR 98 | Temp 97.5°F | Ht 69.0 in | Wt 180.3 lb

## 2021-12-22 DIAGNOSIS — I5031 Acute diastolic (congestive) heart failure: Secondary | ICD-10-CM | POA: Diagnosis not present

## 2021-12-22 DIAGNOSIS — I110 Hypertensive heart disease with heart failure: Secondary | ICD-10-CM

## 2021-12-22 DIAGNOSIS — I10 Essential (primary) hypertension: Secondary | ICD-10-CM | POA: Diagnosis not present

## 2021-12-22 DIAGNOSIS — Z1159 Encounter for screening for other viral diseases: Secondary | ICD-10-CM

## 2021-12-22 DIAGNOSIS — D72829 Elevated white blood cell count, unspecified: Secondary | ICD-10-CM

## 2021-12-22 DIAGNOSIS — I4891 Unspecified atrial fibrillation: Secondary | ICD-10-CM

## 2021-12-22 DIAGNOSIS — J432 Centrilobular emphysema: Secondary | ICD-10-CM

## 2021-12-22 DIAGNOSIS — R7689 Other specified abnormal immunological findings in serum: Secondary | ICD-10-CM

## 2021-12-22 DIAGNOSIS — J9601 Acute respiratory failure with hypoxia: Secondary | ICD-10-CM

## 2021-12-22 DIAGNOSIS — R768 Other specified abnormal immunological findings in serum: Secondary | ICD-10-CM | POA: Diagnosis not present

## 2021-12-22 DIAGNOSIS — F17210 Nicotine dependence, cigarettes, uncomplicated: Secondary | ICD-10-CM

## 2021-12-22 DIAGNOSIS — F17200 Nicotine dependence, unspecified, uncomplicated: Secondary | ICD-10-CM

## 2021-12-22 NOTE — Patient Instructions (Addendum)
Thank you, [name] for allowing [place] to provide your care today. Today we discussed your recent hospitalization.  I am checking your kidney function and hepatitis C.  Please make sure to follow-up with cardiology and pulmonology as scheduled.  Ensure you are also taking all your medications and inhalers as prescribed.  I want you to try compression socks to help with your lower extremity swelling and also elevate your leg as much as possible during the day.  I have ordered the following labs for you:   Lab Orders         BMP8+Anion Gap         Hepatitis C Ab reflex to Quant PCR      I will call if any are abnormal. All of your labs can be accessed through "My Chart".  I am referring you to rheumatology for further evaluation of your positive ANA  My Chart Access: [email or web address]  Please follow-up with your primary care provider in 4 to 6 weeks.  Please make sure to arrive 15 minutes prior to your next appointment. If you arrive late, you may be asked to reschedule.    We look forward to seeing you next time. Please call our clinic at [phone] if you have any questions or concerns. The best time to call is Monday-Friday from 9am-4pm, but there is someone available 24/7. If after hours or the weekend, call the main hospital number and ask for the Internal Medicine Resident On-Call. If you need medication refills, please notify your pharmacy one week in advance and they will send [place] a request.   Thank you for letting us take part in your care. Wishing you the best!  Hector Rainwater, MD 12/22/2021, 11:16 AM IM Resident, PGY-2 Hector [name] 41:10

## 2021-12-22 NOTE — Progress Notes (Signed)
   CC: Hospital follow-up  HPI:  Mr.Augusta A Marquard is a 66 y.o. male with PMH as below who presents to clinic to follow-up on a recent hospitalization for acute hypoxic respiratory failure. Please see problem based charting for evaluation, assessment and plan.  Past Medical History:  Diagnosis Date   Arthritis    Claudication in peripheral vascular disease (HCC) 09/05/2018   GERD (gastroesophageal reflux disease)    Gout    L knee   Hyperlipidemia    Hypertension     Review of Systems:  Constitutional: Positive for fatigue. Eyes: Negative for visual changes Respiratory: Negative for shortness of breath. Positive for occasional dyspnea on exertion. Cardiac: Negative for chest pain or palpitations. Extremities: Positive for leg swelling Neuro: Negative for headache, dizziness or weakness  Physical Exam: General: Pleasant, well-appearing elderly male. No acute distress. Neck: Supple. No JVD. Cardiac: Mild tachycardia.  Regular rhythm.  No murmurs, rubs or gallops. Trace BLE edema Respiratory: On room air. Lungs CTAB. Distant crackles at the bases. Abdominal: Soft, symmetric and non tender. Normal BS. Skin: Warm, dry and intact without rashes or lesions Extremities: Atraumatic. Full ROM. Palpable radial and DP pulses. Neuro: A&O x 3. Moves all extremities. Normal sensation to gross touch Psych: Appropriate mood and affect.  Vitals:   12/22/21 1019  BP: 115/70  Pulse: 98  Temp: (!) 97.5 F (36.4 C)  TempSrc: Oral  SpO2: 97%  Weight: 180 lb 4.8 oz (81.8 kg)  Height: 5\' 9"  (1.753 m)    Assessment & Plan:   See Encounters Tab for problem based charting.  Patient discussed with Dr. , MD, MPH

## 2021-12-23 ENCOUNTER — Other Ambulatory Visit (HOSPITAL_COMMUNITY): Payer: Self-pay

## 2021-12-23 ENCOUNTER — Encounter: Payer: Self-pay | Admitting: Nurse Practitioner

## 2021-12-23 ENCOUNTER — Telehealth: Payer: Self-pay | Admitting: Nurse Practitioner

## 2021-12-23 ENCOUNTER — Telehealth (HOSPITAL_COMMUNITY): Payer: Self-pay | Admitting: Pharmacist

## 2021-12-23 LAB — BMP8+ANION GAP
Anion Gap: 19 mmol/L — ABNORMAL HIGH (ref 10.0–18.0)
BUN/Creatinine Ratio: 19 (ref 10–24)
BUN: 19 mg/dL (ref 8–27)
CO2: 18 mmol/L — ABNORMAL LOW (ref 20–29)
Calcium: 9.6 mg/dL (ref 8.6–10.2)
Chloride: 103 mmol/L (ref 96–106)
Creatinine, Ser: 0.98 mg/dL (ref 0.76–1.27)
Glucose: 85 mg/dL (ref 70–99)
Potassium: 5.1 mmol/L (ref 3.5–5.2)
Sodium: 140 mmol/L (ref 134–144)
eGFR: 87 mL/min/{1.73_m2} (ref >59)

## 2021-12-23 LAB — HCV AB W REFLEX TO QUANT PCR: HCV Ab: NONREACTIVE

## 2021-12-23 LAB — HCV INTERPRETATION

## 2021-12-23 NOTE — Telephone Encounter (Signed)
Transitions of Care Pharmacy   Call attempted for a pharmacy transitions of care follow-up. HIPAA appropriate voicemail was left with call back information provided.   Call attempt #1. Will follow-up in 1-3 days.    

## 2021-12-23 NOTE — Telephone Encounter (Signed)
Patient called back and spoke with [facility]- states that he does not have additional insurance and canceled his PFT and new patient appointment with Dr. Delton Coombes until he can get a VA authorization. Patient states he will be contacting the [place] as he has an appointment this Friday.

## 2021-12-24 ENCOUNTER — Encounter: Payer: Self-pay | Admitting: Student

## 2021-12-24 DIAGNOSIS — R768 Other specified abnormal immunological findings in serum: Secondary | ICD-10-CM | POA: Insufficient documentation

## 2021-12-24 NOTE — Assessment & Plan Note (Signed)
New diagnosis during recent hospitalization. Patient started on diltiazem drip and quickly converted to normal sinus rhythm. He was discharged home on p.o. diltiazem and Eliquis. His home Plavix was discontinued. Mildly tachycardic but regular rhythm today. He denies any palpitations. -Scheduled to follow-up with cardiology on 6/22 -Continue diltiazem 100 mg daily -Continue Eliquis 5 mg twice daily

## 2021-12-24 NOTE — Assessment & Plan Note (Signed)
Patient has smoked close to pack a day of cigarettes for many years found to have evidence of emphysema on a recent CT. Started on maintenance inhaler and as needed inhaler by pulmonology.  Plan for PFT to further evaluate. -PFT as scheduled by pulmonology -Pulmonology follow-up on 7/11 -Provided smoking cessation counseling

## 2021-12-24 NOTE — Assessment & Plan Note (Signed)
Patient still on steroid taper.  He will need repeat CBC once he is off steroids.

## 2021-12-24 NOTE — Assessment & Plan Note (Signed)
Patient's presenting symptom was thought to be partially due to his heart failure. Echo during recent hospitalization showed EF 60-65% with no valvular abnormality. Cardiology was consulted and patient was started on [place]. Patient not overly fluid overloaded today. He endorsed some fatigue and occasional dyspnea on exertion at home but overall no shortness of breath.  On exam, he has mild bibasilar rales and trace BLE edema but no JVD. -Follow-up with cardiology as scheduled on 6/22 -Continue Entresto 24-26 mg twice daily -Continue Farxiga 10 mg daily

## 2021-12-24 NOTE — Assessment & Plan Note (Signed)
States he is not ready to quit right now. Counseled patient that smoking cessation is the most important thing he can do to help improve his symptoms.  Informed patient that we have resources to help when he is ready. -Reassess readiness to quit at each office visit

## 2021-12-24 NOTE — Assessment & Plan Note (Signed)
Patient was recently hospitalized from 6/1-6/12 for acute hypoxemic respiratory failure and pulmonary edema after presenting with hypoxia with SPO2 in the 70s requiring nonrebreather mask.  Labs showed mild leukocytosis x-ray shows small left pleural effusion and some airspace opacities and CTA was negative for PE but showed moderate pulmonary edema with L>R pleural effusions, bibasilar consolidations and mediastinal and bilateral hilar lymphadenopathy.  Pulmonology was consulted and patient was started on high-dose steroids.  HRCT did not show any evidence of ILD but revealed 3 mm lung nodule, emphysema and enlarged pulmonary trunk consistent with PAH.  The/Q scan was unrevealing.  Patient was diuresed for net of -24 L and discharged home on oxygen.  Patient was also discharge with a steroid taper with plan to follow-up with pulmonology.  Patient followed up with pulmonology on 6/14 and was started on Stiolto maintenance inhaler as well as as needed albuterol.  Patient was also scheduled for PFT and split-night sleep study. Today, patient states he continues to have fatigue but his cough and shortness of breath has improved. He does report occasional dyspnea with exertion causing him to use his oxygen at home sometimes but mostly uses it at night.  SPO2 97% on room air today.  Plan: -Continue on maintenance and as needed inhalers -Continue sleep study and PFT as scheduled by pulmonology -Follow-up with pulmonology as scheduled on 7/11 -Follow-up with PCP at the [facility]

## 2021-12-24 NOTE — Assessment & Plan Note (Addendum)
Patient was previously on amlodipine and lisinopril-HCTZ prior to hospitalization. BP meds were held during hospitalization due to normal BPs. Patient blood pressure remained stable today. Patient had mild AKI during hospitalization. Repeat BMP showed normal kidney function and no electrolyte abnormalities.  We will continue to hold BP meds and have patient follow-[facility] with [facility]. Vitals:   12/22/21 1019  BP: 115/70  -Continue to hold BP meds

## 2021-12-24 NOTE — Assessment & Plan Note (Signed)
At the recent hospitalization, patient was found to have a positive ANA with speckled pattern. The differential for this pattern includes SLE,  Sjogrens Syndrome, and mixed connective tissue disease.  ACE and ANCA titers were normal.  Patient denies any family history of rheumatological conditions such as lupus.  -Rheumatology referral

## 2021-12-25 ENCOUNTER — Encounter: Payer: Self-pay | Admitting: Cardiology

## 2021-12-25 ENCOUNTER — Ambulatory Visit: Payer: No Typology Code available for payment source | Admitting: Cardiology

## 2021-12-25 VITALS — BP 102/67 | HR 108 | Temp 97.7°F | Resp 16 | Ht 69.0 in | Wt 178.2 lb

## 2021-12-25 DIAGNOSIS — E782 Mixed hyperlipidemia: Secondary | ICD-10-CM

## 2021-12-25 DIAGNOSIS — I5032 Chronic diastolic (congestive) heart failure: Secondary | ICD-10-CM

## 2021-12-25 DIAGNOSIS — F1010 Alcohol abuse, uncomplicated: Secondary | ICD-10-CM

## 2021-12-25 DIAGNOSIS — I739 Peripheral vascular disease, unspecified: Secondary | ICD-10-CM

## 2021-12-25 DIAGNOSIS — I288 Other diseases of pulmonary vessels: Secondary | ICD-10-CM

## 2021-12-25 DIAGNOSIS — Z9862 Peripheral vascular angioplasty status: Secondary | ICD-10-CM

## 2021-12-25 DIAGNOSIS — Z7901 Long term (current) use of anticoagulants: Secondary | ICD-10-CM

## 2021-12-25 DIAGNOSIS — I480 Paroxysmal atrial fibrillation: Secondary | ICD-10-CM

## 2021-12-25 DIAGNOSIS — I10 Essential (primary) hypertension: Secondary | ICD-10-CM

## 2021-12-25 NOTE — Progress Notes (Signed)
Hector [name] Date of Birth: 09/24/1955 MRN: [id number] [facility] Provider:[facility] Providers: [place], APRN, FNP-C, Dr. Yates Decamp  Primary Cardiologist: Tessa Lerner, DO, [facility] (established care 01/05/2020)  Date: 12/25/21 Last Office Visit: 01/05/2020  Chief Complaint  Patient presents with   peripheral artery disease   heart failure management    Hospitalization Follow-up    HPI  Hector [name] is a 66 y.o.  male who presents to the office with a chief complaint of " reevaluation of peripheral artery disease." Patient's past medical history and cardiovascular risk factors include: Chronic HFpEF, paroxysmal atrial fibrillation, establish peripheral artery disease with prior intervention and claudication, mixed hyperlipidemia, hypertension, alcohol abuse, and tobacco use disorder.  Last seen in the office in July 2021 and then has been lost to follow-up until recently he was hospitalized in June 2023 for shortness of breath and acute hypoxic respiratory failure secondary to acute HFpEF and atrial fibrillation.  During the hospitalization cardiology was consulted for incidental finding of a dilated pulmonary artery on CT scan with concerns for possible pulmonary hypertension.  HFpEF: Since hospitalization patient states that he is doing well.  Did not bring his medication bottles nor does he remember the medications that he is taking on a regular basis.  But he endorses compliance.  His shortness of breath remains relatively stable.  He denies orthopnea, paroxysmal nocturnal dyspnea or lower extremity swelling.  Paroxysmal atrial fibrillation: Currently on rate control strategy and anticoagulation for thromboembolic prophylaxis.  Patient unfortunately continues to drink 6 packs on a daily basis and each drink is 25 ounces.  Patient is very well aware that the amount of alcohol consumption is significant which predisposes him to increased risk of bleeding  since he is on blood thinners.  He has never been followed by GI for evaluation of esophageal varices.    Dilated pulmonary artery: Incidentally noted on CT high-resolution study in June 2023.  Echocardiogram notes no significant TR signal to comment on RVSP or PASP, right ventricular function is preserved based on RV S' and TAPSE.  VQ scan was performed which was negative for PE.  Patient is scheduled for sleep consultation in the coming weeks.  PAD: Patient had abdominal aortic runoff which noted peripheral artery disease in bilateral lower extremity.  Left SFA was noted to have 80% stenosis in the midsegment at 40% of the proximal segment without gradient.  He underwent successful PTA with 5 x 40 mm Mustang and 6 x 80 mm drug-coated balloon in the mid left SFA with 0% residual stenosis. He denies claudication. Continues to smoke 0.5ppd .  In the past stop taking cilostazol secondary to dizziness.  Patient denies anginal discomfort.  However during the hospitalization he had EKGs which noted inferolateral changes.  FUNCTIONAL STATUS: No structured exercise program or daily routine. 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    ALLERGIES: No Known Allergies   MEDICATION LIST PRIOR TO VISIT: Current Outpatient Medications on File Prior to Visit  Medication Sig Dispense Refill   albuterol (VENTOLIN HFA) 108 (90 Base) MCG/ACT inhaler Inhale 2 puffs into the lungs every 6 (six) hours as needed for wheezing or shortness of breath. 8 g 2   apixaban (ELIQUIS) 5 MG TABS tablet Take 1 tablet (5 mg total) by mouth 2 (two) times daily. 60 tablet 2   ASPIRIN LOW DOSE 81 MG EC tablet TAKE 1 TABLET BY MOUTH EVERY DAY  90 tablet 2   dapagliflozin propanediol (FARXIGA) 10 MG TABS tablet Take 1 tablet (10 mg total) by mouth daily. 30 tablet 2   diclofenac Sodium (VOLTAREN) 1 % GEL as needed.     diltiazem (CARDIZEM CD) 180 MG 24 hr capsule Take 1 capsule (180 mg  total) by mouth daily. 30 capsule 2   etodolac (LODINE) 400 MG tablet Take 400 mg by mouth 2 (two) times daily as needed.     ferrous sulfate 325 (65 FE) MG tablet Take 1 tablet (325 mg total) by mouth daily with breakfast. 30 tablet 2   Multiple Vitamins-Minerals (MULTIVITAMIN MEN 50+ PO) Take by mouth daily.     omeprazole (PRILOSEC OTC) 20 MG tablet Take 20 mg by mouth daily.     ondansetron (ZOFRAN-ODT) 4 MG disintegrating tablet Take 1 tablet (4 mg total) by mouth every 8 (eight) hours as needed for nausea or vomiting. 20 tablet 0   predniSONE (DELTASONE) 10 MG tablet Take 4 tablets (40 mg total) by mouth daily for 3 days, THEN 3 tablets (30 mg total) daily for 3 days, THEN 2 tablets (20 mg total) daily for 3 days, THEN 1 tablet (10 mg total) daily for 3 days. 30 tablet 0   rosuvastatin (CRESTOR) 20 MG tablet Take 1 tablet (20 mg total) by mouth at bedtime. 90 tablet 0   sacubitril-valsartan (ENTRESTO) 24-26 MG Take 1 tablet by mouth 2 (two) times daily. 60 tablet 2   Tiotropium Bromide-Olodaterol (STIOLTO RESPIMAT) 2.5-2.5 MCG/ACT AERS Inhale 2 puffs into the lungs daily. 4 g 5   Tiotropium Bromide-Olodaterol (STIOLTO RESPIMAT) 2.5-2.5 MCG/ACT AERS Inhale 2 puffs into the lungs daily. 4 g 0   No current facility-administered medications on file prior to visit.   PAST MEDICAL HISTORY: Past Medical History:  Diagnosis Date   Arthritis    Claudication in peripheral vascular disease ([place]) 09/05/2018   GERD (gastroesophageal reflux disease)    Gout    L knee   Hyperlipidemia    Hypertension     PAST SURGICAL HISTORY: Past Surgical History:  Procedure Laterality Date   ABDOMINAL AORTOGRAM W/LOWER EXTREMITY N/A 09/06/2018   Procedure: ABDOMINAL AORTOGRAM W/LOWER EXTREMITY;  Surgeon: Nigel Mormon, MD;  Location: [place] [facility];  Service: Cardiovascular;  Laterality: N/A;   FACIAL COSMETIC SURGERY     due ot shattered bones    PERIPHERAL VASCULAR BALLOON ANGIOPLASTY  09/06/2018    Procedure: PERIPHERAL VASCULAR BALLOON ANGIOPLASTY;  Surgeon: Nigel Mormon, MD;  Location: [place] [facility];  Service: Cardiovascular;;  LT SFA   right hand fracture     TOTAL KNEE ARTHROPLASTY Left 04/04/2013   Procedure: LEFT TOTAL KNEE ARTHROPLASTY;  Surgeon: Tobi Bastos, MD;  Location: [facility];  Service: Orthopedics;  Laterality: Left;    FAMILY HISTORY: The patient's family history includes Cancer in his mother.   SOCIAL HISTORY:  The patient  reports that he has been smoking cigarettes. He has a 39.00 pack-year smoking history. He has never used smokeless tobacco. He reports current alcohol use of about 12.0 standard drinks of alcohol per week. He reports that he does not use drugs.  Review of Systems  Cardiovascular:  Negative for chest pain, dyspnea on exertion, leg swelling, palpitations and syncope.    PHYSICAL EXAM:    12/25/2021   10:54 AM 12/22/2021   10:19 AM 12/17/2021    9:51 AM  Vitals with BMI  Height 5\' 9"  5\' 9"  5\' 9"   Weight 178  lbs 3 oz 180 lbs 5 oz 176 lbs 6 oz  BMI 26.3 [id number]  Systolic [id number]  Diastolic 67 70 56  Pulse [id number] 98 101    CONSTITUTIONAL: Well-developed and well-nourished. No acute distress.  SKIN: Skin is warm and dry. No rash noted. No cyanosis. No pallor. No jaundice HEAD: Normocephalic and atraumatic.  EYES: No scleral icterus MOUTH/THROAT: Moist oral membranes.  NECK: No JVD present. No thyromegaly noted. No carotid bruits  LYMPHATIC: No visible cervical adenopathy.  CHEST Normal respiratory effort. No intercostal retractions  LUNGS: Clear to auscultation bilaterally.  No stridor. No wheezes. No rales.  CARDIOVASCULAR: Regular rate and rhythm, S1-S2, no murmurs rubs or gallops appreciated. ABDOMINAL: No apparent ascites.  EXTREMITIES: No peripheral edema.  2+ bilateral femoral pulses, 1+ left popliteal pulse patient was wearing a knee brace, 2+ right popliteal pulse, 2+ bilateral posterior tibial pulses, 1+ left  dorsalis pedis, 2+ left dorsalis pedis. HEMATOLOGIC: No significant bruising NEUROLOGIC: Oriented to person, place, and time. Nonfocal. Normal muscle tone.  PSYCHIATRIC: Normal mood and affect. Normal behavior. Cooperative  CARDIAC DATABASE: EKG: 12/09/2021: NSR 77 bpm, without underlying ischemia injury pattern.  Compared to prior ECG TWI in inferolateral leads are not present.   12/09/2021: Atrial fibrillation, 145 bpm, ST-T changes in the inferolateral leads, without underlying injury pattern.   12/10/2021: Normal sinus rhythm, 68 bpm PACs, without underlying injury pattern.  12/25/2021: Normal sinus rhythm, 99 bpm, normal axis, without underlying ischemia or injury pattern, nonspecific T wave abnormality.  Echocardiogram: 12/05/2021: LVEF 60 to 65%, no regional wall motion abnormality, grade 1 diastolic dysfunction, right ventricular systolic function normal sinus, tricuspid regurgitation is inadequate for assessment of PA pressures, estimated RAP 15 mmHg.  Stress Testing:  Exercise sestamibi stress test 07/11/2018: Patient exercised on Bruce protocol for 7:15 minutes and achieved 8.24 METS.  Achieved 88% of maximum predicted heart rate. Myocardial perfusion imaging is normal. Overall left ventricular systolic function was normal without regional wall motion abnormalities. The left ventricular ejection fraction was 62%.  This is a low risk study.  Heart Catheterization: None  Abdominal aortic runoff with peripheral vascular intervention: 09/06/2018: Patent renal arteries Normal distal abdominal aorta & bilateral EIA and Rt Internal iliac artery Lt Internal iliac artery 50% stenosis Rt SFA mid 30% stenosis Lt SFA mid 80% stenosis prox 40% stenosis without pullback gradient Patent Lt PTA. Occluded Lt ATA and peroneal artery. Peroneal artery reconstitutes with collaterals to have two vessel runoff at ankle level Successful PTA 5.0 X 40 mm Mustang & 6.0 X 80 mm In.Pact Admiral DCB in left mid  SFA 0% residual stenosis  Vascular imaging: Lower Extremity Arterial Duplex 12/23/2018: Diffuse heterogeneous plaque in both lower extremities.  Mildly abnormal spectral waveforms of the right ankle. Normal PVR waveforms of the right ankle. Mildly abnormal spectral waveforms of the left ankle.  Mildly abnormal PVR waveforms of the left ankle.  Mildly decreased right resting ABI 0.81. Moderately decreased left resting ABI 0.74.  Compared to 07/08/2018, left SFA stenosis not present, represents successful revascularization. No significant change in ABI.   Lower Extremity Arterial Duplex 12/25/2019:  No hemodynamically significant stenoses are identified in the right lower extremity arterial system.  Moderate velocity increase at the left mid popliteal artery suggests >50% stenosis.  Left SFA angioplasty site appears patent.  This exam reveals mildly decreased perfusion of the right lower extremity, noted at the dorsalis pedis artery level (ABI 0.88) and normal perfusion of the left lower extremity (ABI  1.00).  Compared to the study done on 12/23/2018, no significant change in the right ABI, left ABI was 0.74.  Left SFA angioplasty site was previously patent.  It is patent now as well however popliteal artery stenosis is new.  LABORATORY DATA:    Latest Ref Rng & Units 12/15/2021    3:13 AM 12/14/2021    2:47 AM 12/13/2021    3:36 AM  CBC  WBC 4.0 - 10.5 K/uL 19.3  22.3  17.7   Hemoglobin 13.0 - 17.0 g/dL 11.3  12.3  11.4   Hematocrit 39.0 - 52.0 % 37.5  40.4  37.2   Platelets 150 - 400 K/uL 517  518  506        Latest Ref Rng & Units 12/22/2021   11:25 AM 12/15/2021    3:13 AM 12/14/2021    2:47 AM  CMP  Glucose 70 - 99 mg/dL 85  116  105   BUN 8 - 27 mg/dL 19  23  21    Creatinine 0.76 - 1.27 mg/dL 0.98  1.26  1.17   Sodium 134 - 144 mmol/L 140  141  139   Potassium 3.5 - 5.2 mmol/L 5.1  3.7  3.9   Chloride 96 - 106 mmol/L 103  106  103   CO2 20 - 29 mmol/L 18  26  24    Calcium 8.6 -  10.2 mg/dL 9.6  8.8  9.0     Lipid Panel     Component Value Date/Time   CHOL 98 12/11/2021 0149   TRIG 71 12/11/2021 0149   HDL 52 12/11/2021 0149   CHOLHDL 1.9 12/11/2021 0149   VLDL 14 12/11/2021 0149   LDLCALC 32 12/11/2021 0149    Lab Results  Component Value Date   HGBA1C 4.7 (L) 12/10/2021   No components found for: "NTPROBNP" Lab Results  Component Value Date   TSH 1.422 12/10/2021   TSH 0.311 (L) 12/05/2021    Cardiac Panel (last 3 results) No results for input(s): "CKTOTAL", "CKMB", "TROPONINIHS", "RELINDX" in the last 72 hours.  IMPRESSION:    ICD-10-CM   1. Chronic heart failure with preserved ejection fraction (HFpEF) (HCC)  I50.32 PCV MYOCARDIAL PERFUSION WITH LEXISCAN    2. Paroxysmal atrial fibrillation (HCC)  I48.0 PCV MYOCARDIAL PERFUSION WITH LEXISCAN    3. Long term (current) use of anticoagulants  Z79.01 Ambulatory referral to Gastroenterology    4. Dilated pulmonary trunk (HCC)  I28.8     5. PAD (peripheral artery disease) (HCC)  I73.9 EKG 12-Lead    PCV MYOCARDIAL PERFUSION WITH LEXISCAN    6. S/P percutaneous transluminal angioplasty (PTA)  Z98.62     7. Benign essential hypertension  I10     8. Mixed hyperlipidemia  E78.2     9. Alcohol abuse  F10.10        RECOMMENDATIONS: KAESON EARY is a 66 y.o. male whose past medical history and cardiovascular risk factors include: Establish peripheral artery disease with prior intervention and claudication, mixed hyperlipidemia, hypertension, alcohol abuse, and tobacco use disorder.  Chronic heart failure with preserved ejection fraction (HFpEF) ([place]) Euvolemic on physical examination. Last hospitalization June 2023. Unable to uptitrate GDMT as he does not know what medications he is taking on a daily basis and does not have a list with him.  He is encouraged to bring 1 at the next visit. Hospitalization records reviewed. Recommend stress test to evaluate for ischemic burden given the new  diagnosis of HFpEF, A-fib, and  abnormal EKG during his hospitalization.  Patient states that he is unable to exercise.  We will proceed with nuclear stress test.  Further recommendations to follow.  Paroxysmal atrial fibrillation (HCC) Currently normal sinus rhythm. Rate control: Cardizem. Rhythm control: N/A. Thromboembolic prophylaxis: Eliquis [id number] SCORE is 3 which correlates to 3 % risk of stroke per year (HFpEF, aortic atherosclerosis, HTN).  We will proceed with stress test to rule out ischemic burden as discussed above. Patient has an appointment with sleep medicine in the coming weeks. Patient is also a candidate for the ongoing A-fib trial.  Patient would like to look into this.  In addition, would like to ensure that he illustrates compliance prior to enrolling into research study.  Long term (current) use of anticoagulants Tolerating oral anticoagulation well and does not endorse evidence of bleeding.  Patient currently drinks 6 packs/day each alcoholic beverages 25 ounces.  Patient is educated in great detail that such significant amount of alcohol intake predisposes him to bleeding and being on oral anticoagulation the risk is higher.  Patient states that he understands the risks and wants to avoid having a stroke would like to continue anticoagulation. I will refer him to GI for evaluation of possible varices/EGD.  Dilated pulmonary trunk (HCC) Incidentally noted on CT high-resolution study 12/10/2021 Echo: Preserved LVEF, RV size and function preserved, no significant TR signal to comment on RVSP or PASP.  Her right ventricular function is preserved based on S prime and TAPSE.  VQ scan: Low probability for PE. HIV screen: Nonreactive. Angiotensin-converting enzymes: Within normal limits. C-ANCA and p-ANCA within normal limits. My pretest probability of him having WHO class I PAH is low.  However, would like to complete the appropriate noninvasive work-up and if he  illustrates compliance discussed right heart catheterization.  PAD (peripheral artery disease) (HCC), S/P percutaneous transluminal angioplasty (PTA) Chronic and stable. Asymptomatic -denies claudication. Currently on aspirin and statin therapy. Educated on the importance of complete smoking cessation. LDL levels are well controlled. At the next office visit we will discuss doing LE Arterial Duplex  to reevaluate his PAD  Benign essential hypertension Office blood pressures are well controlled. Medications reviewed.  Mixed hyperlipidemia Currently on rosuvastatin.   He denies myalgia or other side effects. Most recent lipids dated 12/11/2021, independently reviewed as noted above.  LDL currently at goal  Alcohol abuse Patient is consuming significant amount of alcohol on a daily basis which predisposes him to morbidity, mortality from complications of bleeding. He verablizes understanding I have educated him today and during his recent hospitalization with the importance of complete cessation or reducing it to no more than 2 standard alcoholic beverages per day. Currently drinks 6 packs/day each beverage 25 pounds.  Given the fact that he is on anticoagulation we will refer him to GI as noted above to evaluate for esophageal varices and the risk of bleeding.  FINAL MEDICATION LIST END OF ENCOUNTER: No orders of the defined types were placed in this encounter.   There are no discontinued medications.    Current Outpatient Medications:    albuterol (VENTOLIN HFA) 108 (90 Base) MCG/ACT inhaler, Inhale 2 puffs into the lungs every 6 (six) hours as needed for wheezing or shortness of breath., Disp: 8 g, Rfl: 2   apixaban (ELIQUIS) 5 MG TABS tablet, Take 1 tablet (5 mg total) by mouth 2 (two) times daily., Disp: 60 tablet, Rfl: 2   ASPIRIN LOW DOSE 81 MG EC tablet, TAKE 1 TABLET BY MOUTH EVERY DAY,  Disp: 90 tablet, Rfl: 2   dapagliflozin propanediol (FARXIGA) 10 MG TABS tablet, Take 1  tablet (10 mg total) by mouth daily., Disp: 30 tablet, Rfl: 2   diclofenac Sodium (VOLTAREN) 1 % GEL, as needed., Disp: , Rfl:    diltiazem (CARDIZEM CD) 180 MG 24 hr capsule, Take 1 capsule (180 mg total) by mouth daily., Disp: 30 capsule, Rfl: 2   etodolac (LODINE) 400 MG tablet, Take 400 mg by mouth 2 (two) times daily as needed., Disp: , Rfl:    ferrous sulfate 325 (65 FE) MG tablet, Take 1 tablet (325 mg total) by mouth daily with breakfast., Disp: 30 tablet, Rfl: 2   Multiple Vitamins-Minerals (MULTIVITAMIN MEN 50+ PO), Take by mouth daily., Disp: , Rfl:    omeprazole (PRILOSEC OTC) 20 MG tablet, Take 20 mg by mouth daily., Disp: , Rfl:    ondansetron (ZOFRAN-ODT) 4 MG disintegrating tablet, Take 1 tablet (4 mg total) by mouth every 8 (eight) hours as needed for nausea or vomiting., Disp: 20 tablet, Rfl: 0   predniSONE (DELTASONE) 10 MG tablet, Take 4 tablets (40 mg total) by mouth daily for 3 days, THEN 3 tablets (30 mg total) daily for 3 days, THEN 2 tablets (20 mg total) daily for 3 days, THEN 1 tablet (10 mg total) daily for 3 days., Disp: 30 tablet, Rfl: 0   rosuvastatin (CRESTOR) 20 MG tablet, Take 1 tablet (20 mg total) by mouth at bedtime., Disp: 90 tablet, Rfl: 0   sacubitril-valsartan (ENTRESTO) 24-26 MG, Take 1 tablet by mouth 2 (two) times daily., Disp: 60 tablet, Rfl: 2   Tiotropium Bromide-Olodaterol (STIOLTO RESPIMAT) 2.5-2.5 MCG/ACT AERS, Inhale 2 puffs into the lungs daily., Disp: 4 g, Rfl: 5   Tiotropium Bromide-Olodaterol (STIOLTO RESPIMAT) 2.5-2.5 MCG/ACT AERS, Inhale 2 puffs into the lungs daily., Disp: 4 g, Rfl: 0  Orders Placed This Encounter  Procedures   Ambulatory referral to Gastroenterology   PCV MYOCARDIAL PERFUSION WITH LEXISCAN   EKG 12-Lead    --Continue cardiac medications as reconciled in final medication list. --Return in about 4 weeks (around 01/22/2022) for Follow up, A. fib, heart failure management., Review test results. Or sooner if  needed. --Continue follow-up with your primary care physician regarding the management of your other chronic comorbid conditions.  Patient's questions and concerns were addressed to his satisfaction. He voices understanding of the instructions provided during this encounter.   This note was created using a voice recognition software as a result there may be grammatical errors inadvertently enclosed that do not reflect the nature of this encounter. Every attempt is made to correct such errors.  Tessa Lerner, [place], [facility]  Pager: [phone] Office: [phone]

## 2021-12-30 ENCOUNTER — Other Ambulatory Visit: Payer: Self-pay | Admitting: Internal Medicine

## 2021-12-30 ENCOUNTER — Other Ambulatory Visit (HOSPITAL_COMMUNITY): Payer: Self-pay

## 2021-12-31 ENCOUNTER — Telehealth (HOSPITAL_COMMUNITY): Payer: Self-pay

## 2021-12-31 NOTE — Telephone Encounter (Signed)
Transitions of Care Pharmacy  ° °Call attempted for a pharmacy transitions of care follow-up. HIPAA appropriate voicemail was left with call back information provided.  ° °Call attempt #1. Will follow-up in 2-3 days.  °  °

## 2022-01-05 ENCOUNTER — Ambulatory Visit (INDEPENDENT_AMBULATORY_CARE_PROVIDER_SITE_OTHER): Payer: No Typology Code available for payment source | Admitting: Emergency Medicine

## 2022-01-05 DIAGNOSIS — J432 Centrilobular emphysema: Secondary | ICD-10-CM | POA: Diagnosis not present

## 2022-01-05 LAB — PULMONARY FUNCTION TEST
DL/VA % pred: 36 %
DL/VA: 1.54 ml/min/mmHg/L
DLCO cor % pred: 35 %
DLCO cor: 9.15 ml/min/mmHg
DLCO unc % pred: 32 %
DLCO unc: 8.17 ml/min/mmHg
FEF 25-75 Post: 2.68 L/sec
FEF 25-75 Pre: 1.84 L/sec
FEF2575-%Change-Post: 45 %
FEF2575-%Pred-Post: 101 %
FEF2575-%Pred-Pre: 69 %
FEV1-%Change-Post: 8 %
FEV1-%Pred-Post: 96 %
FEV1-%Pred-Pre: 88 %
FEV1-Post: 3.14 L
FEV1-Pre: 2.89 L
FEV1FVC-%Change-Post: 1 %
FEV1FVC-%Pred-Pre: 94 %
FEV6-%Change-Post: 7 %
FEV6-%Pred-Post: 105 %
FEV6-%Pred-Pre: 98 %
FEV6-Post: 4.35 L
FEV6-Pre: 4.04 L
FEV6FVC-%Change-Post: 0 %
FEV6FVC-%Pred-Post: 104 %
FEV6FVC-%Pred-Pre: 104 %
FVC-%Change-Post: 6 %
FVC-%Pred-Post: 100 %
FVC-%Pred-Pre: 94 %
FVC-Post: 4.37 L
FVC-Pre: 4.08 L
Post FEV1/FVC ratio: 72 %
Post FEV6/FVC ratio: 100 %
Pre FEV1/FVC ratio: 71 %
Pre FEV6/FVC Ratio: 99 %
RV % pred: 77 %
RV: 1.7 L
TLC % pred: 93 %
TLC: 6.15 L

## 2022-01-05 NOTE — Progress Notes (Signed)
PFT done today. 

## 2022-01-07 ENCOUNTER — Telehealth (HOSPITAL_COMMUNITY): Payer: Self-pay

## 2022-01-07 ENCOUNTER — Other Ambulatory Visit (HOSPITAL_COMMUNITY): Payer: Self-pay

## 2022-01-07 NOTE — Telephone Encounter (Signed)
Pharmacy Transitions of Care Follow-up Telephone Call  Date of discharge: 12/15/21  Discharge Diagnosis: new onset afib/hypoxia  How have you been since you were released from the hospital? Patient doing well since discharge. Patient has been taking both Clopidogrel and Eliquis since discharge but was instructed at discharge to stop taking clopidogrel and switch to Eliquis. Will contact provider for for further clarification. Left voicemail for Dr. Emelda Brothers office for them to call back patient with further instructions on patient's blood thinners.  Medication changes made at discharge:     START taking: Cartia XT (diltiazem)  Eliquis (apixaban)  Entresto (sacubitril-valsartan)  Farxiga (dapagliflozin propanediol)  FeroSul (ferrous sulfate)  STOP taking: amLODipine 10 MG tablet (NORVASC)  clopidogrel 75 MG tablet (PLAVIX)  lisinopril-hydrochlorothiazide 20-12.5 MG tablet (ZESTORETIC)   Medication changes verified by the patient? Yes    Medication Accessibility:  Home Pharmacy: [facility]    Was the patient provided with refills on discharged medications? Yes   Have all prescriptions been [facility] from [facility] to home pharmacy? Yes   Is the patient able to afford medications? Has Nurse, learning disability and can use copay card    Medication Review:  APIXABAN (ELIQUIS)  Apixaban 5 mg BID initiated on 12/12/21.  - Discussed importance of taking medication around the same time everyday  - Advised patient of medications to avoid (NSAIDs, ASA)  - Educated that Tylenol (acetaminophen) will be the preferred analgesic to prevent risk of bleeding  - Emphasized importance of monitoring for signs and symptoms of bleeding (abnormal bruising, prolonged bleeding, nose bleeds, bleeding from gums, discolored urine, black tarry stools)  - Advised patient to alert all providers of anticoagulation therapy prior to starting a new medication or having a procedure   Follow-up  Appointments:  [facility]u appt confirmed? Seen by Dr. Odis Hollingshead on 12/25/21   If their condition worsens, is the pt aware to call PCP or go to the Emergency Dept.? Yes  Final Patient Assessment: Patient has had f/u and has refills at home pharmacy

## 2022-01-08 ENCOUNTER — Other Ambulatory Visit (HOSPITAL_COMMUNITY): Payer: Self-pay

## 2022-01-12 ENCOUNTER — Other Ambulatory Visit (HOSPITAL_COMMUNITY): Payer: Self-pay

## 2022-01-13 ENCOUNTER — Encounter: Payer: Self-pay | Admitting: Emergency Medicine

## 2022-01-13 ENCOUNTER — Ambulatory Visit (INDEPENDENT_AMBULATORY_CARE_PROVIDER_SITE_OTHER): Payer: No Typology Code available for payment source | Admitting: Emergency Medicine

## 2022-01-13 DIAGNOSIS — R590 Localized enlarged lymph nodes: Secondary | ICD-10-CM | POA: Diagnosis not present

## 2022-01-13 DIAGNOSIS — F17200 Nicotine dependence, unspecified, uncomplicated: Secondary | ICD-10-CM

## 2022-01-13 DIAGNOSIS — J9601 Acute respiratory failure with hypoxia: Secondary | ICD-10-CM

## 2022-01-13 DIAGNOSIS — J432 Centrilobular emphysema: Secondary | ICD-10-CM

## 2022-01-13 MED ORDER — ALBUTEROL SULFATE HFA 108 (90 BASE) MCG/ACT IN AERS: 2.0000 | INHALATION_SPRAY | Freq: Four times a day (QID) | RESPIRATORY_TRACT | 6 refills | Status: DC | PRN

## 2022-01-13 NOTE — Addendum Note (Signed)
Addended by: Dorisann Frames R on: 01/13/2022 11:07 AM   Modules accepted: Orders

## 2022-01-13 NOTE — Assessment & Plan Note (Signed)
The etiology of his acute hypoxemic respiratory failure and recent hospitalization not entirely clear.  He did have some infiltrates on chest imaging that could be consistent with diastolic dysfunction and diastolic CHF in the setting of hypertension and atrial fibrillation.  He did seem to benefit from diuresis.  He was sent home on supplemental oxygen, is using as needed.  We will perform walking oximetry today to confirm whether he still requires it, titrate the dose.  His pulmonary function testing shows a diffusion defect without any significant restriction or obstruction.  I will do a trial off of the Stiolto to see if he misses it.  He was not sure that it helped him but he did take it reliably.  No significant PAH, no shunt on his echocardiogram from hospitalization.  Sleep study is ordered, not yet scheduled.

## 2022-01-13 NOTE — Assessment & Plan Note (Signed)
He has cut down to 0.5 pack/day.  Congratulated him on this.  We will continue to work to decrease.

## 2022-01-13 NOTE — Assessment & Plan Note (Signed)
Enlarged node 1.6 cm in the AP window, stable going back to 2014.  Etiology unclear, question whether he may have quiescent sarcoidosis.  Given his long-term stability I think we can continue to follow it on serial imaging.  If films are changing or if clinical status changes then would consider bronchoscopy with EBUS to further evaluate.  The node should be amenable to biopsy at station 4L

## 2022-01-13 NOTE — Progress Notes (Signed)
Subjective:    Patient ID: Hector [name], male    DOB: 13-Jun-1956, 66 y.o.   MRN: [id number]  HPI 66 year old smoker (40 pack years) with history of hypertension, atrial fibrillation, hyperlipidemia, GERD, prior substance abuse.  He was admitted in early June for cough and dyspnea with bilateral pulmonary infiltrates.  RVP and COVID were negative.  CRP, ESR and ACE level were all normal.  Echocardiogram revealed diastolic dysfunction and he did clinically respond to diuresis.  He did not respond significantly to steroids, bronchodilators, antibiotics.  Was sent home with O2, has been using this prn, was not desaturated on arrival here today. He was seen in our office 1 month ago and started empirically on Stiolto to see if he would get benefit.  PFT performed as below.  He reports that he still has exertional SOB when walking dogs. Able to do some housework, able to shop for groceries. He has cut his cigarettes to 0.5 pk/day.   High-resolution CT chest performed 12/09/2021 and reviewed by me did not show any evidence of ILD,, did reveal some subtle emphysematous change and possible evidence for RB-ILD without honeycomb or traction bronchiectasis.  There were stable bilateral pulmonary nodules 3-4 mm (unchanged from 2014).  There is 1.6 cm AP window mediastinal node again stable compared with 2014.  VQ scan 12/12/2021 low probability for PE  Echocardiogram 12/05/2021 with EF 99-37%, grade 1 diastolic dysfunction, normal RV size and function, no evidence of intracardiac shunt on bubble study  Pulmonary function testing 01/05/2022 reviewed by me shows grossly normal airflows, possible mild obstruction based on curve of his flow-volume loop.  No response to bronchodilator.  Suspected mild restriction based on the decreased RV.  The TLC is normal.  Significantly decreased diffusion capacity that does not correct when adjusted for his alveolar volume.   Review of Systems As per HPI  Past Medical History:   Diagnosis Date   Arthritis    Claudication in peripheral vascular disease ([place]) 09/05/2018   GERD (gastroesophageal reflux disease)    Gout    L knee   Hyperlipidemia    Hypertension      Family History  Problem Relation Age of Onset   Cancer Mother      Social History   Socioeconomic History   Marital status: Single    Spouse name: Not on file   Number of children: 3   Years of education: Not on file   Highest education level: Not on file  Occupational History   Occupation: roofer  Tobacco Use   Smoking status: Every Day    Packs/day: 1.00    Years: 39.00    Total pack years: 39.00    Types: Cigarettes   Smokeless tobacco: Never   Tobacco comments:    Smokes 0.5 packs per day 01/13/22 [facility]   Vaping Use   Vaping Use: Never used  Substance and Sexual Activity   Alcohol use: Yes    Alcohol/week: 12.0 standard drinks of alcohol    Types: 12 Cans of beer per week    Comment: daily beer, socially   Drug use: No   Sexual activity: Not on file  Other Topics Concern   Not on file  Social History Narrative   Currently resides in apartment in [place] with 2 dogs, [place] and [place]. VA helped secure housing, was unhoused for several years prior. 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 Currently retired. Has 2 daughters, Crystal  and [place], who reside in [place]. Had 1 son who passed. Sister also resides in [place]. Smokes 1 PPD since teenager, hx of alcohol abuse but not currently.    Social Determinants of Health   Financial Resource Strain: Not on file  Food Insecurity: Not on file  Transportation Needs: Not on file  Physical Activity: Not on file  Stress: Not on file  Social Connections: Not on file  Intimate Partner Violence: Not on file     No Known Allergies   Outpatient Medications Prior to Visit  Medication Sig Dispense Refill   albuterol (VENTOLIN HFA) 108 (90 Base) MCG/ACT inhaler Inhale 2 puffs into the lungs every 6 (six)  hours as needed for wheezing or shortness of breath. 8 g 2   apixaban (ELIQUIS) 5 MG TABS tablet Take 1 tablet (5 mg total) by mouth 2 (two) times daily. 60 tablet 2   ASPIRIN LOW DOSE 81 MG EC tablet TAKE 1 TABLET BY MOUTH EVERY DAY 90 tablet 2   dapagliflozin propanediol (FARXIGA) 10 MG TABS tablet Take 1 tablet (10 mg total) by mouth daily. 30 tablet 2   diclofenac Sodium (VOLTAREN) 1 % GEL as needed.     diltiazem (CARDIZEM CD) 180 MG 24 hr capsule Take 1 capsule (180 mg total) by mouth daily. 30 capsule 2   etodolac (LODINE) 400 MG tablet Take 400 mg by mouth 2 (two) times daily as needed.     ferrous sulfate 325 (65 FE) MG tablet Take 1 tablet (325 mg total) by mouth daily with breakfast. 30 tablet 2   Multiple Vitamins-Minerals (MULTIVITAMIN MEN 50+ PO) Take by mouth daily.     omeprazole (PRILOSEC OTC) 20 MG tablet Take 20 mg by mouth daily.     ondansetron (ZOFRAN-ODT) 4 MG disintegrating tablet Take 1 tablet (4 mg total) by mouth every 8 (eight) hours as needed for nausea or vomiting. 20 tablet 0   sacubitril-valsartan (ENTRESTO) 24-26 MG Take 1 tablet by mouth 2 (two) times daily. 60 tablet 2   Tiotropium Bromide-Olodaterol (STIOLTO RESPIMAT) 2.5-2.5 MCG/ACT AERS Inhale 2 puffs into the lungs daily. 4 g 5   rosuvastatin (CRESTOR) 20 MG tablet Take 1 tablet (20 mg total) by mouth at bedtime. 90 tablet 0   Tiotropium Bromide-Olodaterol (STIOLTO RESPIMAT) 2.5-2.5 MCG/ACT AERS Inhale 2 puffs into the lungs daily. 4 g 0   No facility-administered medications prior to visit.         Objective:   Physical Exam Vitals:   01/13/22 0922  BP: 118/70  Pulse: 94  Temp: 98 F (36.7 C)  TempSrc: Oral  SpO2: 93%  Weight: 173 lb 9.6 oz (78.7 kg)  Height: $Remove'5\' 9"'KiAngWk$  (1.753 m)   Gen: Pleasant, well-nourished, in no distress,  normal affect  ENT: No lesions,  mouth clear,  oropharynx clear, no postnasal drip  Neck: No JVD, no stridor  Lungs: No use of accessory muscles, no crackles or  wheezing on normal respiration, no wheeze on forced expiration  Cardiovascular: RRR, heart sounds normal, no murmur or gallops, no peripheral edema  Musculoskeletal: No deformities, no cyanosis or clubbing  Neuro: alert, awake, non focal  Skin: Warm, no lesions or rash      Assessment & Plan:  Acute hypoxemic respiratory failure (HCC) The etiology of his acute hypoxemic respiratory failure and recent hospitalization not entirely clear.  He did have some infiltrates on chest imaging that could be consistent with diastolic dysfunction and diastolic CHF in the setting of hypertension and  atrial fibrillation.  He did seem to benefit from diuresis.  He was sent home on supplemental oxygen, is using as needed.  We will perform walking oximetry today to confirm whether he still requires it, titrate the dose.  His pulmonary function testing shows a diffusion defect without any significant restriction or obstruction.  I will do a trial off of the Stiolto to see if he misses it.  He was not sure that it helped him but he did take it reliably.  No significant PAH, no shunt on his echocardiogram from hospitalization.  Sleep study is ordered, not yet scheduled.  Tobacco use disorder He has cut down to 0.5 pack/day.  Congratulated him on this.  We will continue to work to decrease.  Centrilobular emphysema ([place]) Noted on his high-resolution CT chest.  His pulmonary function testing does not show any significant obstruction.  He is at high risk for evolving COPD given his tobacco use.  Discussed cessation with him today.  I think we can take a break from [place] to see if he misses it.  If so then we will restart BD therapy.  Mediastinal adenopathy Enlarged node 1.6 cm in the AP window, stable going back to 2014.  Etiology unclear, question whether he may have quiescent sarcoidosis.  Given his long-term stability I think we can continue to follow it on serial imaging.  If films are changing or if clinical  status changes then would consider bronchoscopy with EBUS to further evaluate.  The node should be amenable to biopsy at station 4L   Baltazar Apo, MD, PhD 01/13/2022, 10:06 AM [facility] [phone] or if no answer before 7:00PM call [phone] For any issues after 7:00PM please call eLink [phone]

## 2022-01-13 NOTE — Addendum Note (Signed)
Addended by: Dorisann Frames R on: 01/13/2022 10:45 AM   Modules accepted: Orders

## 2022-01-13 NOTE — Patient Instructions (Signed)
Try stopping your Stiolto for now.  Keep track of how your breathing does.  If you miss the medication then please call our office so we can consider restarting it. We will give you an albuterol inhaler for you to have available to use 2 puffs if needed for shortness of breath, chest tightness, wheezing. We will perform a walking oximetry today to determine whether you need to continue using oxygen when you exert yourself. We will work on scheduling your sleep study. We will repeat your CT scan of the chest with contrast in early December 2023 to compare with prior. Follow with Dr Delton Coombes in December after your CT scan or sooner if you have any problems.

## 2022-01-13 NOTE — Assessment & Plan Note (Signed)
Noted on his high-resolution CT chest.  His pulmonary function testing does not show any significant obstruction.  He is at high risk for evolving COPD given his tobacco use.  Discussed cessation with him today.  I think we can take a break from Stiolto to see if he misses it.  If so then we will restart BD therapy.

## 2022-02-08 ENCOUNTER — Ambulatory Visit (HOSPITAL_BASED_OUTPATIENT_CLINIC_OR_DEPARTMENT_OTHER): Payer: No Typology Code available for payment source | Attending: Nurse Practitioner | Admitting: Pulmonary Disease

## 2022-02-08 DIAGNOSIS — R0902 Hypoxemia: Secondary | ICD-10-CM | POA: Insufficient documentation

## 2022-02-08 DIAGNOSIS — R0683 Snoring: Secondary | ICD-10-CM | POA: Diagnosis not present

## 2022-02-08 DIAGNOSIS — I5031 Acute diastolic (congestive) heart failure: Secondary | ICD-10-CM

## 2022-02-11 DIAGNOSIS — I5031 Acute diastolic (congestive) heart failure: Secondary | ICD-10-CM

## 2022-02-11 NOTE — Procedures (Signed)
    Patient Name: Hector [name], Hector [name] Date: 02/08/2022 Gender: Male D.O.B: [birth year] Age (years): 54 Referring Provider: Noemi Chapel NP Height (inches): 69 Interpreting Physician: Coralyn Helling MD, ABSM Weight (lbs): 171 RPSGT: Cherylann Parr BMI: 25 MRN: [id number] Neck Size: 16.00  CLINICAL INFORMATION Sleep Study Type: NPSG  Indication for sleep study:  Snoring, Witnessed Apneas, Hypoxic Respiratory Failure  Epworth Sleepiness Score: 6  SLEEP STUDY TECHNIQUE As per the AASM Manual for the Scoring of Sleep and Associated Events v2.3 (April 2016) with a hypopnea requiring 4% desaturations.  The channels recorded and monitored were frontal, central and occipital EEG, electrooculogram (EOG), submentalis EMG (chin), nasal and oral airflow, thoracic and abdominal wall motion, anterior tibialis EMG, snore microphone, electrocardiogram, and pulse oximetry.  MEDICATIONS Medications self-administered by patient taken the night of the study : N/A  SLEEP ARCHITECTURE The study was initiated at 11:10:50 PM and ended at 5:10:42 AM.  Sleep onset time was 15.2 minutes and the sleep efficiency was 78.0%%. The total sleep time was 280.7 minutes.  Stage REM latency was 62.0 minutes.  The patient spent 4.3%% of the night in stage N1 sleep, 72.6%% in stage N2 sleep, 0.0%% in stage N3 and 23.2% in REM.  Alpha intrusion was absent.  Supine sleep was 13.36%.  RESPIRATORY PARAMETERS The overall apnea/hypopnea index (AHI) was 0.0 per hour. There were 0 total apneas, including 0 obstructive, 0 central and 0 mixed apneas. There were 0 hypopneas and 0 RERAs.  The AHI during Stage REM sleep was 0.0 per hour.  AHI while supine was 0.0 per hour.  The mean oxygen saturation was 90.9%. The minimum SpO2 during sleep was 87.0%.  He wore 2 liters oxygen during this study.  Moderate snoring was noted during this study.  CARDIAC DATA The 2 lead EKG demonstrated sinus rhythm. The mean heart  rate was 78.2 beats per minute. Other EKG findings include: None.  LEG MOVEMENT DATA The total PLMS were 0 with a resulting PLMS index of 0.0. Associated arousal with leg movement index was 0.0 .  IMPRESSIONS - No significant obstructive sleep apnea occurred during this study (AHI = 0.0/h). - His SpO2 low was 87% and he spent 22.8 minutes of time in bed with an SpO2 < 88%.  He wore 2 liters oxygen during this study.   - The patient snored with moderate snoring volume.  DIAGNOSIS - Nocturnal Hypoxemia. - Snoring.  RECOMMENDATIONS - He should have his nocturnal oxygen increased to 3 liters while asleep.  - Avoid alcohol, sedatives and other CNS depressants that may worsen sleep apnea and disrupt normal sleep architecture. - Sleep hygiene should be reviewed to assess factors that may improve sleep quality. - Weight management and regular exercise should be initiated or continued if appropriate.  [Electronically signed] 02/11/2022 08:13 AM  Coralyn Helling MD, ABSM Diplomate, American Board of Sleep Medicine NPI: [id number]   [facility] PH: [phone]   FX: [phone] ACCREDITED BY THE AMERICAN ACADEMY OF SLEEP MEDICINE

## 2022-02-13 ENCOUNTER — Telehealth: Payer: Self-pay | Admitting: Nurse Practitioner

## 2022-02-13 ENCOUNTER — Telehealth: Payer: Self-pay

## 2022-02-13 NOTE — Telephone Encounter (Signed)
Just to make you aware - patient has had a couple of episodes of hypotension but denies having any symptoms.  Average Systolic BP Level 104.81 mmHg Lowest Systolic BP Level 88 mmHg Highest Systolic BP Level 127 mmHg  02/13/2022 Friday at 12:14 PM 90 / 54      02/12/2022 Thursday at 08:10 AM 102 / 69      02/11/2022 Wednesday at 08:41 AM 90 / 61      02/11/2022 Wednesday at 08:09 AM 88 / 61      02/10/2022 Tuesday at 08:17 AM 96 / 61      02/09/2022 Monday at 12:57 PM 100 / 65      02/08/2022 Sunday at 10:39 AM 102 / 67      02/05/2022 Thursday at 08:03 AM 105 / 66      02/05/2022 Thursday at 08:00 AM 104 / 66      02/04/2022 Wednesday at 08:17 AM 97 / 51      02/01/2022 Sunday at 08:24 AM 95 / 63      01/31/2022 Saturday at 08:20 AM 104 / 72      01/30/2022 Friday at 08:36 AM 98 / 66      01/29/2022 Thursday at 08:06 AM 106 / 70      01/28/2022 Wednesday at 08:17 AM 114 / 68      07 /25/2023 Tuesday at 08:22 AM 102 / 68

## 2022-02-16 NOTE — Telephone Encounter (Signed)
Called and spoke with patient. Patient verbalized understanding. Nothing further needed.  

## 2022-02-18 ENCOUNTER — Telehealth: Payer: Self-pay

## 2022-02-18 DIAGNOSIS — E782 Mixed hyperlipidemia: Secondary | ICD-10-CM

## 2022-02-18 DIAGNOSIS — I739 Peripheral vascular disease, unspecified: Secondary | ICD-10-CM

## 2022-02-18 MED ORDER — ROSUVASTATIN CALCIUM 20 MG PO TABS: 20.0000 mg | ORAL_TABLET | Freq: Every day | ORAL | 0 refills | Status: DC

## 2022-02-18 NOTE — Telephone Encounter (Signed)
How is his pulse?  Continue the current medications. He was suppose to have stress test and see GI (recommended at last visit).  Please have him come in for office visit after these have been done as he is past due on follow up appt.   Dr. Odis Hollingshead

## 2022-02-18 NOTE — Telephone Encounter (Signed)
Patient reports to be taking all cardiac meds listed on his profile: Eliquis, Aspirin, Farxiga, Diltiazem, Rosuvastatin, Sherryll Burger

## 2022-02-24 NOTE — Telephone Encounter (Signed)
Can you have him come in and check the device to make sure the blood pressures are being measured accurately. If they are measured accurately discontinue Entresto and transition him to losartan 25 mg p.o. every afternoon and have him take Farxiga in the morning.  Tamsyn Owusu Meser[place], DO, [facility]

## 2022-02-26 ENCOUNTER — Other Ambulatory Visit: Payer: Self-pay

## 2022-02-26 DIAGNOSIS — I5031 Acute diastolic (congestive) heart failure: Secondary | ICD-10-CM

## 2022-02-26 DIAGNOSIS — I10 Essential (primary) hypertension: Secondary | ICD-10-CM

## 2022-02-26 MED ORDER — DAPAGLIFLOZIN PROPANEDIOL 10 MG PO TABS: 10.0000 mg | ORAL_TABLET | Freq: Every morning | ORAL | 5 refills | Status: DC

## 2022-02-26 MED ORDER — LOSARTAN POTASSIUM 25 MG PO TABS: 25.0000 mg | ORAL_TABLET | Freq: Every day | ORAL | 1 refills | Status: DC

## 2022-02-26 NOTE — Telephone Encounter (Signed)
Yes, discontinue Entresto and start losartan 25mg  po qPM and continue Farxiga in the morning.   Dr. 

## 2022-02-27 ENCOUNTER — Other Ambulatory Visit: Payer: Self-pay

## 2022-02-27 DIAGNOSIS — I10 Essential (primary) hypertension: Secondary | ICD-10-CM

## 2022-02-27 DIAGNOSIS — I4891 Unspecified atrial fibrillation: Secondary | ICD-10-CM

## 2022-02-27 NOTE — Progress Notes (Signed)
Evaluating for possible bleed due to low blood pressures

## 2022-02-27 NOTE — Telephone Encounter (Signed)
Agree.   Please ask if he has signs of bleeding, black tarry stools, etc.   Tessa Lerner, DO, [facility]

## 2022-03-03 LAB — BASIC METABOLIC PANEL
BUN/Creatinine Ratio: 10 (ref 10–24)
BUN: 13 mg/dL (ref 8–27)
CO2: 20 mmol/L (ref 20–29)
Calcium: 9.8 mg/dL (ref 8.6–10.2)
Chloride: 105 mmol/L (ref 96–106)
Creatinine, Ser: 1.33 mg/dL — ABNORMAL HIGH (ref 0.76–1.27)
Glucose: 102 mg/dL — ABNORMAL HIGH (ref 70–99)
Potassium: 4.4 mmol/L (ref 3.5–5.2)
Sodium: 142 mmol/L (ref 134–144)
eGFR: 60 mL/min/{1.73_m2} (ref >59)

## 2022-03-03 LAB — HEMOGLOBIN AND HEMATOCRIT, BLOOD
Hematocrit: 51.4 % — ABNORMAL HIGH (ref 37.5–51.0)
Hemoglobin: 16.1 g/dL (ref 13.0–17.7)

## 2022-03-04 ENCOUNTER — Ambulatory Visit: Payer: No Typology Code available for payment source

## 2022-03-04 DIAGNOSIS — I5032 Chronic diastolic (congestive) heart failure: Secondary | ICD-10-CM

## 2022-03-04 DIAGNOSIS — I480 Paroxysmal atrial fibrillation: Secondary | ICD-10-CM

## 2022-03-04 DIAGNOSIS — I739 Peripheral vascular disease, unspecified: Secondary | ICD-10-CM

## 2022-03-10 ENCOUNTER — Other Ambulatory Visit: Payer: Self-pay

## 2022-03-10 DIAGNOSIS — I5031 Acute diastolic (congestive) heart failure: Secondary | ICD-10-CM

## 2022-03-16 NOTE — Progress Notes (Unsigned)
Patient is a 66 yo [place] American male who has been hypotensive at baseline for the past several weeks. Patient continues to deny having any hypotensive symptoms. Labs have shown slight decline in renal function and hemoconcentration. Suspected dehydration causing BP to be so low. Holding Entresto and SGLT-2 because of this.  Patient was instructed to increase his water intake, however patient reports difficulty with this.  When I spoke with the patient today, he could not find a Farxia bottle in his medications but did have a bottle for Jardiance which he has been taking (patient unsure who prescribed); advised to hold. Also instructed patient to not take Paul Dykes if he found it.   Patient had been taking Entresto still and advised today to hold the medication. He is still taking Eliquis, rosuvastatin, ferrous sulfate and diltiazem. He also has not yet picked up the losartan (to only be taken if SBP > 120).  Appointment scheduled with Dr. Odis Hollingshead on 9/18 for evaluation of hypotension.  BP readings also uploaded into Media tab as "Home Monitoring Data - BP Readings"  03/16/2022 Monday at 08:42 AM 77 / 53      03/14/2022 Saturday at 11:40 AM 105 / 64      03/13/2022 Friday at 08:05 AM 111 / 70      03/12/2022 Thursday at 08:13 AM 88 / 59      03/11/2022 Wednesday at 08:11 AM 95 / 61      03/10/2022 Tuesday at 08:20 AM 82 / 58      03/09/2022 Monday at 08:03 AM 86 / 60      03/07/2022 Saturday at 06:05 PM 87 / 61      03/07/2022 Saturday at 10:50 AM 84 / 59      03/06/2022 Friday at 08:16 AM 81 / 50      03/05/2022 Thursday at 05:00 PM 103 / 67      03/05/2022 Thursday at 08:51 AM 93 / 60      03/04/2022 Wednesday at 08:14 AM 100 / 58      03/03/2022 Tuesday at 11:00 AM 94 / 56      03/02/2022 Monday at 05:25 PM 80 / 50      03/02/2022 Monday at 11:04 AM 95 / 61      03/01/2022 Sunday at 12:12 PM 109 / 69      02/28/2022 Saturday at 08:09 AM 84 / 61      02/27/2022 Friday at 10:02 AM 91 /  62      02/27/2022 Friday at 08:36 AM 64 / 41      02/26/2022 Thursday at 11:54 AM 113 / 77      02/26/2022 Thursday at 08:06 AM 92 / 63      02/25/2022 Wednesday at 08:09 AM 83 / 58      02/24/2022 Tuesday at 08:20 AM 95 / 60      02/23/2022 Monday at 08:13 AM 95 / 64      02/22/2022 Sunday at 08:06 AM 93 / 62      02/21/2022 Saturday at 08:33 AM 93 / 60      02/20/2022 Friday at 08:41 AM 98 / 63      02/19/2022 Thursday at 08:30 AM 89 / 61      02/18/2022 Wednesday at 03:19 PM 100 / 63      02/17/2022 Tuesday at 08:15 AM 105 / 70      08 /14/2023 Monday at 10:00 AM 116 / 75  02/15/2022 Sunday at 02:26 PM 114 / 70      02/14/2022 Saturday at 08:08 AM 113 / 74      02/13/2022 Friday at 12:14 PM 90 / 54      02/12/2022 Thursday at 08:10 AM 102 / 69      02/11/2022 Wednesday at 08:41 AM 90 / 61      02/11/2022 Wednesday at 08:09 AM 88 / 61      02/10/2022 Tuesday at 08:17 AM 96 / 61      02/09/2022 Monday at 12:57 PM 100 / 65      02/08/2022 Sunday at 10:39 AM 102 / 67      02/05/2022 Thursday at 08:03 AM 105 / 66      02/05/2022 Thursday at 08:00 AM 104 / 66      02/04/2022 Wednesday at 08:17 AM 97 / 51      07 /30/2023 Sunday at 08:24 AM 95 / 63

## 2022-03-16 NOTE — Progress Notes (Signed)
Please remind the patient to bring the caridac medication bottles at the next office visit to help facilitate a more accurate medication reconciliation.  Jessa Stinson [place], DO, [facility]

## 2022-03-16 NOTE — Patient Instructions (Signed)
Stop taking Entresto (sacubitril/valsartan) and Jardiance (empagliflozin) for now.  Only take the losartan if SBP >120 (top blood pressure number).  Continue taking: Eliquis (apixaban), rosuvastatin, ferrous sulfate, diltiazem.

## 2022-03-18 NOTE — Progress Notes (Signed)
Called and spoke to patient he voiced understanding

## 2022-03-21 LAB — BASIC METABOLIC PANEL WITH GFR
BUN/Creatinine Ratio: 11 (ref 10–24)
BUN: 11 mg/dL (ref 8–27)
CO2: 22 mmol/L (ref 20–29)
Calcium: 9.8 mg/dL (ref 8.6–10.2)
Chloride: 103 mmol/L (ref 96–106)
Creatinine, Ser: 0.98 mg/dL (ref 0.76–1.27)
Glucose: 81 mg/dL (ref 70–99)
Potassium: 4.4 mmol/L (ref 3.5–5.2)
Sodium: 142 mmol/L (ref 134–144)
eGFR: 87 mL/min/1.73

## 2022-03-23 ENCOUNTER — Encounter: Payer: Self-pay | Admitting: Cardiology

## 2022-03-23 ENCOUNTER — Ambulatory Visit: Payer: No Typology Code available for payment source | Admitting: Cardiology

## 2022-03-23 VITALS — BP 114/69 | HR 88 | Temp 98.1°F | Resp 16 | Ht 69.0 in | Wt 171.4 lb

## 2022-03-23 DIAGNOSIS — Z7901 Long term (current) use of anticoagulants: Secondary | ICD-10-CM

## 2022-03-23 DIAGNOSIS — I288 Other diseases of pulmonary vessels: Secondary | ICD-10-CM

## 2022-03-23 DIAGNOSIS — F17200 Nicotine dependence, unspecified, uncomplicated: Secondary | ICD-10-CM

## 2022-03-23 DIAGNOSIS — I739 Peripheral vascular disease, unspecified: Secondary | ICD-10-CM

## 2022-03-23 DIAGNOSIS — I480 Paroxysmal atrial fibrillation: Secondary | ICD-10-CM

## 2022-03-23 DIAGNOSIS — I5032 Chronic diastolic (congestive) heart failure: Secondary | ICD-10-CM

## 2022-03-23 DIAGNOSIS — E782 Mixed hyperlipidemia: Secondary | ICD-10-CM

## 2022-03-23 DIAGNOSIS — F1010 Alcohol abuse, uncomplicated: Secondary | ICD-10-CM

## 2022-03-23 DIAGNOSIS — I10 Essential (primary) hypertension: Secondary | ICD-10-CM

## 2022-03-23 MED ORDER — ROSUVASTATIN CALCIUM 20 MG PO TABS: 20.0000 mg | ORAL_TABLET | Freq: Every day | ORAL | 0 refills | Status: DC

## 2022-03-23 NOTE — Progress Notes (Signed)
Hector [name] Date of Birth: 08/10/1955 MRN: [id number] Primary Care Provider:[facility] Providers: Jeri Lager, APRN, FNP-C, Dr. Adrian Prows  Primary Cardiologist: Rex Kras, DO, [facility] (established care 01/05/2020)  Date: 03/23/22 Last Office Visit: 12/25/2021  Chief Complaint  Patient presents with   Results   Follow-up    HPI  Hector [name] is a 66 y.o.  male whose past medical history and cardiovascular risk factors include: Chronic HFpEF, paroxysmal atrial fibrillation, establish peripheral artery disease with prior intervention and now no claudication, mixed hyperlipidemia, hypertension, alcohol abuse, and tobacco use disorder.  Hospitalized in June 2023 for acute hypoxic respiratory failure secondary to HFpEF exacerbation and A-fib.  During last hospitalization his medical therapy was uptitrated and now presents to the office to be reevaluated as his remote monitoring shows hypotension and kidney function suggestive of renal insufficiency/AKI.  Patient brings in his bottles today to perform more accurate medication reconciliation.  Because his blood pressures were soft he was asked to hold off on Jardiance as well as Entresto.  However, he is not taking HCTZ, lisinopril, amlodipine.  Patient has very poor insight and his comorbid conditions.  Clinically denies anginal discomfort or heart failure symptoms.  Does not endorse any evidence of bleeding.  Unfortunately he continues to drink 2 cans of beer on a daily basis and each can of 25 ounces.  In the past he used to drink up to 6 cans on a daily basis.  Patient has been known history of PAD in bilateral lower extremities.Left SFA was noted to have 80% stenosis in the midsegment at 40% of the proximal segment without gradient.  He underwent successful PTA with 5 x 40 mm Mustang and 6 x 80 mm drug-coated balloon in the mid left SFA with 0% residual stenosis. He denies claudication. Continues to smoke 0.5ppd  .  In the past stop taking cilostazol secondary to dizziness.   FUNCTIONAL STATUS: No structured exercise program or daily routine. 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    ALLERGIES: No Known Allergies   MEDICATION LIST PRIOR TO VISIT: Current Outpatient Medications on File Prior to Visit  Medication Sig Dispense Refill   apixaban (ELIQUIS) 5 MG TABS tablet Take 1 tablet (5 mg total) by mouth 2 (two) times daily. 60 tablet 2   aspirin EC 81 MG tablet TAKE ONE TABLET BY MOUTH DAILY FOR HEART     diclofenac Sodium (VOLTAREN) 1 % GEL as needed.     diltiazem (CARDIZEM CD) 180 MG 24 hr capsule Take 1 capsule (180 mg total) by mouth daily. 30 capsule 2   empagliflozin (JARDIANCE) 25 MG TABS tablet Take 1 tablet by mouth daily.     ferrous sulfate 325 (65 FE) MG tablet Take 1 tablet (325 mg total) by mouth daily with breakfast. 30 tablet 2   Multiple Vitamins-Minerals (MULTIVITAMIN MEN 50+ PO) Take by mouth daily.     nicotine (NICODERM CQ - DOSED IN MG/24 HOURS) 14 mg/24hr patch APPLY 1 PATCH TO SKIN ONCE A DAY *APPLY TO NON-HAIRY, CLEAN, DRY AREA (NO TOBACCO PRODUCTS)* - USE FOR 6 WEEKS     omeprazole (PRILOSEC OTC) 20 MG tablet Take 20 mg by mouth daily.     sacubitril-valsartan (ENTRESTO) 24-26 MG Take 1 tablet by mouth 2 (two) times daily.     tamsulosin (FLOMAX) 0.4 MG CAPS capsule Take 1 capsule by mouth daily.     albuterol (VENTOLIN  HFA) 108 (90 Base) MCG/ACT inhaler Inhale 2 puffs into the lungs every 6 (six) hours as needed for wheezing or shortness of breath. (Patient not taking: Reported on 03/23/2022) 8 g 6   Tiotropium Bromide-Olodaterol (STIOLTO RESPIMAT) 2.5-2.5 MCG/ACT AERS Inhale 2 puffs into the lungs daily. (Patient not taking: Reported on 03/23/2022) 4 g 0   No current facility-administered medications on file prior to visit.   PAST MEDICAL HISTORY: Past Medical History:  Diagnosis Date   Arthritis    Claudication in  peripheral vascular disease ([place]) 09/05/2018   GERD (gastroesophageal reflux disease)    Gout    L knee   Hyperlipidemia    Hypertension     PAST SURGICAL HISTORY: Past Surgical History:  Procedure Laterality Date   ABDOMINAL AORTOGRAM W/LOWER EXTREMITY N/A 09/06/2018   Procedure: ABDOMINAL AORTOGRAM W/LOWER EXTREMITY;  Surgeon: Nigel Mormon, MD;  Location: [place] [facility];  Service: Cardiovascular;  Laterality: N/A;   FACIAL COSMETIC SURGERY     due ot shattered bones    PERIPHERAL VASCULAR BALLOON ANGIOPLASTY  09/06/2018   Procedure: PERIPHERAL VASCULAR BALLOON ANGIOPLASTY;  Surgeon: Nigel Mormon, MD;  Location: [place] [facility];  Service: Cardiovascular;;  LT SFA   right hand fracture     TOTAL KNEE ARTHROPLASTY Left 04/04/2013   Procedure: LEFT TOTAL KNEE ARTHROPLASTY;  Surgeon: Tobi Bastos, MD;  Location: [facility];  Service: Orthopedics;  Laterality: Left;    FAMILY HISTORY: The patient's family history includes Cancer in his mother.   SOCIAL HISTORY:  The patient  reports that he has been smoking cigarettes. He has a 39.00 pack-year smoking history. He has never used smokeless tobacco. He reports current alcohol use of about 12.0 standard drinks of alcohol per week. He reports that he does not use drugs.  Review of Systems  Cardiovascular:  Negative for chest pain, claudication, dyspnea on exertion, irregular heartbeat, leg swelling, near-syncope, orthopnea, palpitations, paroxysmal nocturnal dyspnea and syncope.  Respiratory:  Negative for shortness of breath.   Hematologic/Lymphatic: Negative for bleeding problem.  Musculoskeletal:  Negative for muscle cramps and myalgias.  Neurological:  Negative for dizziness and light-headedness.    PHYSICAL EXAM:    03/23/2022   10:06 AM 02/08/2022    8:40 PM 01/13/2022    9:22 AM  Vitals with BMI  Height 5\' 9"  5\' 9"  5\' 9"   Weight 171 lbs 6 oz 171 lbs 173 lbs 10 oz  BMI 25.3 10.27 25.36  Systolic 644  034   Diastolic 69  70  Pulse 88  94    CONSTITUTIONAL: Well-developed and well-nourished. No acute distress.  SKIN: Skin is warm and dry. No rash noted. No cyanosis. No pallor. No jaundice HEAD: Normocephalic and atraumatic.  EYES: No scleral icterus MOUTH/THROAT: Moist oral membranes.  NECK: No JVD present. No thyromegaly noted. No carotid bruits  LYMPHATIC: No visible cervical adenopathy.  CHEST Normal respiratory effort. No intercostal retractions  LUNGS: Clear to auscultation bilaterally.  No stridor. No wheezes. No rales.  CARDIOVASCULAR: Regular rate and rhythm, S1-S2, no murmurs rubs or gallops appreciated. ABDOMINAL: No apparent ascites.  EXTREMITIES: No peripheral edema.  2+ bilateral femoral pulses, 1+ left popliteal pulse patient was wearing a knee brace, 2+ right popliteal pulse, 2+ bilateral posterior tibial pulses, 1+ left dorsalis pedis, 2+ left dorsalis pedis. HEMATOLOGIC: No significant bruising NEUROLOGIC: Oriented to person, place, and time. Nonfocal. Normal muscle tone.  PSYCHIATRIC: Normal mood and affect. Normal behavior. Cooperative  CARDIAC DATABASE: EKG: 12/09/2021:  NSR 77 bpm, without underlying ischemia injury pattern.  Compared to prior ECG TWI in inferolateral leads are not present.   12/09/2021: Atrial fibrillation, 145 bpm, ST-T changes in the inferolateral leads, without underlying injury pattern.   12/10/2021: Normal sinus rhythm, 68 bpm PACs, without underlying injury pattern.  12/25/2021: Normal sinus rhythm, 99 bpm, normal axis, without underlying ischemia or injury pattern, nonspecific T wave abnormality.  Echocardiogram: 12/05/2021: LVEF 60 to 65%, no regional wall motion abnormality, grade 1 diastolic dysfunction, right ventricular systolic function normal sinus, tricuspid regurgitation is inadequate for assessment of PA pressures, estimated RAP 15 mmHg.  Stress Testing:  Lexiscan (with Mod Bruce protocol) Nuclear stress test 03/04/22 Myocardial perfusion  is normal. Low risk study. Overall LV systolic function is normal without regional wall motion abnormalities. Stress LV EF: 74%.  Nondiagnostic ECG stress. The heart rate response was consistent with Regadenoson. The blood pressure response was physiologic. No previous exam available for comparison.  Heart Catheterization: None  Abdominal aortic runoff with peripheral vascular intervention: 09/06/2018: Patent renal arteries Normal distal abdominal aorta & bilateral EIA and Rt Internal iliac artery Lt Internal iliac artery 50% stenosis Rt SFA mid 30% stenosis Lt SFA mid 80% stenosis prox 40% stenosis without pullback gradient Patent Lt PTA. Occluded Lt ATA and peroneal artery. Peroneal artery reconstitutes with collaterals to have two vessel runoff at ankle level Successful PTA 5.0 X 40 mm Mustang & 6.0 X 80 mm In.Pact Admiral DCB in left mid SFA 0% residual stenosis  Vascular imaging: Lower Extremity Arterial Duplex 12/23/2018: Diffuse heterogeneous plaque in both lower extremities.  Mildly abnormal spectral waveforms of the right ankle. Normal PVR waveforms of the right ankle. Mildly abnormal spectral waveforms of the left ankle.  Mildly abnormal PVR waveforms of the left ankle.  Mildly decreased right resting ABI 0.81. Moderately decreased left resting ABI 0.74.  Compared to 07/08/2018, left SFA stenosis not present, represents successful revascularization. No significant change in ABI.   Lower Extremity Arterial Duplex 12/25/2019:  No hemodynamically significant stenoses are identified in the right lower extremity arterial system.  Moderate velocity increase at the left mid popliteal artery suggests >50% stenosis.  Left SFA angioplasty site appears patent.  This exam reveals mildly decreased perfusion of the right lower extremity, noted at the dorsalis pedis artery level (ABI 0.88) and normal perfusion of the left lower extremity (ABI 1.00).  Compared to the study done on 12/23/2018, no  significant change in the right ABI, left ABI was 0.74.  Left SFA angioplasty site was previously patent.  It is patent now as well however popliteal artery stenosis is new.  LABORATORY DATA:    Latest Ref Rng & Units 03/02/2022    9:25 AM 12/15/2021    3:13 AM 12/14/2021    2:47 AM  CBC  WBC 4.0 - 10.5 K/uL  19.3  22.3   Hemoglobin 13.0 - 17.7 g/dL 16.1  11.3  12.3   Hematocrit 37.5 - 51.0 % 51.4  37.5  40.4   Platelets 150 - 400 K/uL  517  518        Latest Ref Rng & Units 03/20/2022   11:20 AM 03/02/2022    9:26 AM 12/22/2021   11:25 AM  CMP  Glucose 70 - 99 mg/dL 81  102  85   BUN 8 - 27 mg/dL 11  13  19    Creatinine 0.76 - 1.27 mg/dL 0.98  1.33  0.98   Sodium 134 - 144 mmol/L 142  142  140   Potassium 3.5 - 5.2 mmol/L 4.4  4.4  5.1   Chloride 96 - 106 mmol/L 103  105  103   CO2 20 - 29 mmol/L 22  20  18    Calcium 8.6 - 10.2 mg/dL 9.8  9.8  9.6     Lipid Panel     Component Value Date/Time   CHOL 98 12/11/2021 0149   TRIG 71 12/11/2021 0149   HDL 52 12/11/2021 0149   CHOLHDL 1.9 12/11/2021 0149   VLDL 14 12/11/2021 0149   LDLCALC 32 12/11/2021 0149    Lab Results  Component Value Date   HGBA1C 4.7 (L) 12/10/2021   No components found for: "NTPROBNP" Lab Results  Component Value Date   TSH 1.422 12/10/2021   TSH 0.311 (L) 12/05/2021    Cardiac Panel (last 3 results) No results for input(s): "CKTOTAL", "CKMB", "TROPONINIHS", "RELINDX" in the last 72 hours.  IMPRESSION:    ICD-10-CM   1. Chronic heart failure with preserved ejection fraction (HFpEF) (HCC)  I50.32 Pro b natriuretic peptide (BNP)    Basic metabolic panel    Magnesium    2. Paroxysmal atrial fibrillation (HCC)  I48.0     3. Long term (current) use of anticoagulants  Z79.01     4. PAD (peripheral artery disease) (HCC)  I73.9 rosuvastatin (CRESTOR) 20 MG tablet    5. Mixed hyperlipidemia  E78.2 rosuvastatin (CRESTOR) 20 MG tablet    6. Benign essential hypertension  I10     7. Dilated  pulmonary trunk (HCC)  I28.8     8. Alcohol abuse  F10.10     9. Tobacco use disorder  F17.200         RECOMMENDATIONS: ZACHARAY DRAGONE is a 66 y.o. male whose past medical history and cardiovascular risk factors include: Establish peripheral artery disease with prior intervention and claudication, mixed hyperlipidemia, hypertension, alcohol abuse, and tobacco use disorder.  Chronic heart failure with preserved ejection fraction (HFpEF) ([place]) Euvolemic on physical examination. Last hospitalization June 2023. Has very poor insight on his comorbid conditions. Enrolled into remote patient monitoring.  Blood pressure and pulse log reviewed.  Patient was having soft blood pressures and therefore in the recent past we have discontinued Entresto and Jardiance.  However, based on the medications that he brings in today he continues to take HCTZ/lisinopril/amlodipine/etc. Spent a great deal of time reconciling his medications. The goal will be to maximize doses & simplify as many pills as possible to decrease confusion and increase adherence. Discontinue HCTZ/lisinopril and amlodipine. Restart [place] in 1 week to reevaluate kidney function and electrolytes If room for improvement with regards to his ambulatory blood pressure readings would recommend restarting Entresto.  Paroxysmal atrial fibrillation (HCC) Rate control: Cardizem. Rhythm control: N/A. Thromboembolic prophylaxis: Eliquis. CHA2DS2-VASc SCORE is 3 which correlates to 3.2% risk of stroke per year (HFpEF, aortic atherosclerosis, hypertension).  Does not endorse evidence of bleeding. I have asked him to decrease alcohol consumption-which she has completed prior visit.  I have asked him to be evaluated by GI to rule out presence of esophageal varices/occult bleeding.  PAD (peripheral artery disease) (HCC) Chronic and stable. Asymptomatic. Continue aspirin and statin therapy. We emphasized the importance of complete smoking  cessation. At the next office visit we will discuss considering repeat lower extremity arterial duplex to reevaluate disease progression.  Mixed hyperlipidemia Currently on rosuvastatin.   He denies myalgia or other side effects. Refilled rosuvastatin. Patient plans to have fasting lipid profile done  with PCP, requested copy be sent to the office for reference.  Benign essential hypertension Well-controlled. Medication changes as discussed above. Monitor for now  Dilated pulmonary trunk (HCC) Incidentally noted on CT high-resolution study 12/10/2021 Echo: Preserved LVEF, RV size and function preserved, no significant TR signal to comment on RVSP or PASP.  Her right ventricular function is preserved based on S prime and TAPSE.  VQ scan: Low probability for PE. HIV screen: Nonreactive. Angiotensin-converting enzymes: Within normal limits. C-ANCA and p-ANCA within normal limits. Pretest probability of him having WHO class I PAH is low.  Alcohol abuse Has reduced consumption of alcohol on a daily basis since last office visit. Educated on importance of complete cessation. Patient is made aware that alcohol consumption predisposing him bleeding as he is on Eliquis for thromboembolic prophylaxis. Requested that he sees gastroenterology in consultation to rule out esophageal varices or possible occult blood loss   FINAL MEDICATION LIST END OF ENCOUNTER: Meds ordered this encounter  Medications   rosuvastatin (CRESTOR) 20 MG tablet    Sig: Take 1 tablet (20 mg total) by mouth at bedtime.    Dispense:  90 tablet    Refill:  0    Medications Discontinued During This Encounter  Medication Reason   ondansetron (ZOFRAN-ODT) 4 MG disintegrating tablet    etodolac (LODINE) 400 MG tablet    Tiotropium Bromide-Olodaterol (STIOLTO RESPIMAT) 2.5-2.5 MCG/ACT AERS    albuterol (VENTOLIN HFA) 108 (90 Base) MCG/ACT inhaler    ASPIRIN LOW DOSE 81 MG EC tablet    dapagliflozin propanediol (FARXIGA)  10 MG TABS tablet Change in therapy   losartan (COZAAR) 25 MG tablet    amLODipine (NORVASC) 10 MG tablet Discontinued by provider   lisinopril-hydrochlorothiazide (ZESTORETIC) 20-12.5 MG tablet Change in therapy   rosuvastatin (CRESTOR) 20 MG tablet Reorder      Current Outpatient Medications:    apixaban (ELIQUIS) 5 MG TABS tablet, Take 1 tablet (5 mg total) by mouth 2 (two) times daily., Disp: 60 tablet, Rfl: 2   aspirin EC 81 MG tablet, TAKE ONE TABLET BY MOUTH DAILY FOR HEART, Disp: , Rfl:    diclofenac Sodium (VOLTAREN) 1 % GEL, as needed., Disp: , Rfl:    diltiazem (CARDIZEM CD) 180 MG 24 hr capsule, Take 1 capsule (180 mg total) by mouth daily., Disp: 30 capsule, Rfl: 2   empagliflozin (JARDIANCE) 25 MG TABS tablet, Take 1 tablet by mouth daily., Disp: , Rfl:    ferrous sulfate 325 (65 FE) MG tablet, Take 1 tablet (325 mg total) by mouth daily with breakfast., Disp: 30 tablet, Rfl: 2   Multiple Vitamins-Minerals (MULTIVITAMIN MEN 50+ PO), Take by mouth daily., Disp: , Rfl:    nicotine (NICODERM CQ - DOSED IN MG/24 HOURS) 14 mg/24hr patch, APPLY 1 PATCH TO SKIN ONCE A DAY *APPLY TO NON-HAIRY, CLEAN, DRY AREA (NO TOBACCO PRODUCTS)* - USE FOR 6 WEEKS, Disp: , Rfl:    omeprazole (PRILOSEC OTC) 20 MG tablet, Take 20 mg by mouth daily., Disp: , Rfl:    sacubitril-valsartan (ENTRESTO) 24-26 MG, Take 1 tablet by mouth 2 (two) times daily., Disp: , Rfl:    tamsulosin (FLOMAX) 0.4 MG CAPS capsule, Take 1 capsule by mouth daily., Disp: , Rfl:    albuterol (VENTOLIN HFA) 108 (90 Base) MCG/ACT inhaler, Inhale 2 puffs into the lungs every 6 (six) hours as needed for wheezing or shortness of breath. (Patient not taking: Reported on 03/23/2022), Disp: 8 g, Rfl: 6   rosuvastatin (CRESTOR) 20  MG tablet, Take 1 tablet (20 mg total) by mouth at bedtime., Disp: 90 tablet, Rfl: 0   Tiotropium Bromide-Olodaterol (STIOLTO RESPIMAT) 2.5-2.5 MCG/ACT AERS, Inhale 2 puffs into the lungs daily. (Patient not taking:  Reported on 03/23/2022), Disp: 4 g, Rfl: 0  Orders Placed This Encounter  Procedures   Pro b natriuretic peptide (BNP)   Basic metabolic panel   Magnesium    --Continue cardiac medications as reconciled in final medication list. --Return in about 3 months (around 06/22/2022) for Follow up, heart failure management.. Or sooner if needed. --Continue follow-up with your primary care physician regarding the management of your other chronic comorbid conditions.  Patient's questions and concerns were addressed to his satisfaction. He voices understanding of the instructions provided during this encounter.   This note was created using a voice recognition software as a result there may be grammatical errors inadvertently enclosed that do not reflect the nature of this encounter. Every attempt is made to correct such errors.  Rex Kras, [place], [facility]  Pager: [phone] Office: [phone]

## 2022-03-26 ENCOUNTER — Other Ambulatory Visit: Payer: Self-pay

## 2022-03-26 DIAGNOSIS — I739 Peripheral vascular disease, unspecified: Secondary | ICD-10-CM

## 2022-03-26 DIAGNOSIS — E782 Mixed hyperlipidemia: Secondary | ICD-10-CM

## 2022-03-26 MED ORDER — ROSUVASTATIN CALCIUM 20 MG PO TABS: 20.0000 mg | ORAL_TABLET | Freq: Every day | ORAL | 0 refills | Status: AC

## 2022-06-08 ENCOUNTER — Encounter (HOSPITAL_COMMUNITY): Payer: Self-pay

## 2022-06-08 ENCOUNTER — Ambulatory Visit (HOSPITAL_COMMUNITY)
Admission: RE | Admit: 2022-06-08 | Discharge: 2022-06-08 | Disposition: A | Discharge status: Home / Self Care | Payer: No Typology Code available for payment source | Source: Ambulatory Visit | Attending: Emergency Medicine | Admitting: Emergency Medicine

## 2022-06-08 DIAGNOSIS — J479 Bronchiectasis, uncomplicated: Secondary | ICD-10-CM | POA: Diagnosis not present

## 2022-06-08 DIAGNOSIS — I2510 Atherosclerotic heart disease of native coronary artery without angina pectoris: Secondary | ICD-10-CM | POA: Diagnosis not present

## 2022-06-08 DIAGNOSIS — J439 Emphysema, unspecified: Secondary | ICD-10-CM | POA: Insufficient documentation

## 2022-06-08 DIAGNOSIS — I700 Atherosclerosis of aorta: Secondary | ICD-10-CM | POA: Diagnosis not present

## 2022-06-08 DIAGNOSIS — R590 Localized enlarged lymph nodes: Secondary | ICD-10-CM | POA: Insufficient documentation

## 2022-06-16 ENCOUNTER — Encounter: Payer: Self-pay | Admitting: Emergency Medicine

## 2022-06-16 ENCOUNTER — Ambulatory Visit (INDEPENDENT_AMBULATORY_CARE_PROVIDER_SITE_OTHER): Payer: No Typology Code available for payment source | Admitting: Emergency Medicine

## 2022-06-16 VITALS — BP 130/76 | HR 94 | Temp 97.9°F | Ht 69.0 in | Wt 175.8 lb

## 2022-06-16 DIAGNOSIS — J3489 Other specified disorders of nose and nasal sinuses: Secondary | ICD-10-CM

## 2022-06-16 DIAGNOSIS — J432 Centrilobular emphysema: Secondary | ICD-10-CM

## 2022-06-16 DIAGNOSIS — Z23 Encounter for immunization: Secondary | ICD-10-CM | POA: Diagnosis not present

## 2022-06-16 DIAGNOSIS — R590 Localized enlarged lymph nodes: Secondary | ICD-10-CM

## 2022-06-16 NOTE — Patient Instructions (Addendum)
We reviewed your CT scan of the chest today.  This is stable compared with priors without any evidence of new inflammation.  Good news. Please work hard to decrease your smoking.  Ultimate goal will be to stop altogether. Keep your albuterol available to use 2 puffs if needed for shortness of breath, chest tightness, wheezing. We will hold off on starting a scheduled inhaler medication for now.  We may need to do so in the future. Flu shot today We will refer you to see ENT regarding your nasal obstruction and sinuses. Follow with Dr. Delton Coombes in 12 months or sooner if you have any problems.

## 2022-06-16 NOTE — Assessment & Plan Note (Signed)
Stable on serial CT scans.  No clear indication for EBUS at this time.  Question whether he may have quiescent sarcoidosis versus old granulomatous disease-he does have some calcified granulomas on CT.  Continue to follow.  We can determine the timing of his next CT scan at our next office visit.

## 2022-06-16 NOTE — Progress Notes (Signed)
Subjective:    Patient ID: Hector [name], male    DOB: [birth year], 66 y.o.   MRN: [id number]  HPI 66 year old smoker (40 pack years) with history of hypertension, atrial fibrillation, hyperlipidemia, GERD, prior substance abuse.  He was admitted in early June for cough and dyspnea with bilateral pulmonary infiltrates.  RVP and COVID were negative.  CRP, ESR and ACE level were all normal.  Echocardiogram revealed diastolic dysfunction and he did clinically respond to diuresis.  He did not respond significantly to steroids, bronchodilators, antibiotics.  Was sent home with O2, has been using this prn, was not desaturated on arrival here today. He was seen in our office 1 month ago and started empirically on Stiolto to see if he would get benefit.  PFT performed as below.  He reports that he still has exertional SOB when walking dogs. Able to do some housework, able to shop for groceries. He has cut his cigarettes to 0.5 pk/day.   High-resolution CT chest performed 12/09/2021 and reviewed by me did not show any evidence of ILD,, did reveal some subtle emphysematous change and possible evidence for RB-ILD without honeycomb or traction bronchiectasis.  There were stable bilateral pulmonary nodules 3-4 mm (unchanged from 2014).  There is 1.6 cm AP window mediastinal node again stable compared with 2014.  VQ scan 12/12/2021 low probability for PE  Echocardiogram 12/05/2021 with EF 58-09%, grade 1 diastolic dysfunction, normal RV size and function, no evidence of intracardiac shunt on bubble study  Pulmonary function testing 01/05/2022 reviewed by me shows grossly normal airflows, possible mild obstruction based on curve of his flow-volume loop.  No response to bronchodilator.  Suspected mild restriction based on the decreased RV.  The TLC is normal.  Significantly decreased diffusion capacity that does not correct when adjusted for his alveolar volume.   ROV 06/16/22 --follow-up visit 66 year old gentleman,  active smoker with a history of hypertension, atrial fibrillation, hyperlipidemia, GERD, prior substance use.  Have seen him for bilateral pulmonary infiltrates, not entirely understood that resulted in hypoxemic respiratory failure.  Autoimmune evaluation reassuring.  COVID-19 and RVP were negative.  Pulmonary function testing did not show any clear evidence for obstruction but he did have significant diffusion defect.  Of note he did have some emphysematous change and an enlarged node in the AP window on high-resolution CT scan of the chest. He returns today reporting that he is back to smoking. He is overall doing well with his breathing - is a bit slower than several years ago. He stopped Stiolto without any real change in breathing. Never needs albuterol. He is having increased difficulty moving air through his nose - had a fracture and repair in the past.   CT scan of the chest performed 06/08/2022 reviewed by me shows that he still has a low right paratracheal lymph node, low left paratracheal lymph node (stable in size).  He has centrilobular and paraseptal emphysema with some calcified granulomas, mild cylindrical bronchiectatic change.  No new nodules or infiltrates.   Review of Systems As per HPI  Past Medical History:  Diagnosis Date   Arthritis    Claudication in peripheral vascular disease ([facility]) 09/05/2018   GERD (gastroesophageal reflux disease)    Gout    L knee   Hyperlipidemia    Hypertension      Family History  Problem Relation Age of Onset   Cancer Mother      Social History   Socioeconomic History   Marital status: Single  Spouse name: Not on file   Number of children: 3   Years of education: Not on file   Highest education level: Not on file  Occupational History   Occupation: roofer  Tobacco Use   Smoking status: Every Day    Packs/day: 1.00    Years: 39.00    Total pack years: 39.00    Types: Cigarettes   Smokeless tobacco: Never   Tobacco comments:     Smokes 1 pack per day 06/16/22 [facility]   Vaping Use   Vaping Use: Never used  Substance and Sexual Activity   Alcohol use: Yes    Alcohol/week: 12.0 standard drinks of alcohol    Types: 12 Cans of beer per week    Comment: daily beer, socially   Drug use: No   Sexual activity: Not on file  Other Topics Concern   Not on file  Social History Narrative   Currently resides in apartment in [place] with 2 dogs, [place] and [place]. [facility] helped [facility] housing, was unhoused for several years prior. 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 Currently retired. Has 2 daughters, Crystal and [place], who reside in [place]. Had 1 son who passed. Sister also resides in [place]. Smokes 1 PPD since teenager, hx of alcohol abuse but not currently.    Social Determinants of Health   Financial Resource Strain: Not on file  Food Insecurity: Not on file  Transportation Needs: Not on file  Physical Activity: Not on file  Stress: Not on file  Social Connections: Not on file  Intimate Partner Violence: Not on file     No Known Allergies   Outpatient Medications Prior to Visit  Medication Sig Dispense Refill   albuterol (VENTOLIN HFA) 108 (90 Base) MCG/ACT inhaler Inhale 2 puffs into the lungs every 6 (six) hours as needed for wheezing or shortness of breath. 8 g 6   apixaban (ELIQUIS) 5 MG TABS tablet Take 1 tablet (5 mg total) by mouth 2 (two) times daily. 60 tablet 2   aspirin EC 81 MG tablet TAKE ONE TABLET BY MOUTH DAILY FOR HEART     diclofenac Sodium (VOLTAREN) 1 % GEL as needed.     diltiazem (CARDIZEM CD) 180 MG 24 hr capsule Take 1 capsule (180 mg total) by mouth daily. 30 capsule 2   empagliflozin (JARDIANCE) 25 MG TABS tablet Take 1 tablet by mouth daily.     ferrous sulfate 325 (65 FE) MG tablet Take 1 tablet (325 mg total) by mouth daily with breakfast. 30 tablet 2   Multiple Vitamins-Minerals (MULTIVITAMIN MEN 50+ PO) Take by mouth daily.     nicotine (NICODERM CQ -  DOSED IN MG/24 HOURS) 14 mg/24hr patch APPLY 1 PATCH TO SKIN ONCE A DAY *APPLY TO NON-HAIRY, CLEAN, DRY AREA (NO TOBACCO PRODUCTS)* - USE FOR 6 WEEKS     omeprazole (PRILOSEC OTC) 20 MG tablet Take 20 mg by mouth daily.     rosuvastatin (CRESTOR) 20 MG tablet Take 1 tablet (20 mg total) by mouth at bedtime. 90 tablet 0   sacubitril-valsartan (ENTRESTO) 24-26 MG Take 1 tablet by mouth 2 (two) times daily.     tamsulosin (FLOMAX) 0.4 MG CAPS capsule Take 1 capsule by mouth daily.     Tiotropium Bromide-Olodaterol (STIOLTO RESPIMAT) 2.5-2.5 MCG/ACT AERS Inhale 2 puffs into the lungs daily. (Patient not taking: Reported on 03/23/2022) 4 g 0   No facility-administered medications prior to visit.  Objective:   Physical Exam Vitals:   06/16/22 0903  BP: 130/76  Pulse: 94  Temp: 97.9 F (36.6 C)  TempSrc: Oral  SpO2: 93%  Weight: 175 lb 12.8 oz (79.7 kg)  Height: _0  (1.753 m)   Gen: Pleasant, well-nourished, in no distress,  normal affect  ENT: No lesions,  mouth clear,  oropharynx clear, no postnasal drip  Neck: No JVD, no stridor  Lungs: No use of accessory muscles, no crackles or wheezing on normal respiration, no wheeze on forced expiration  Cardiovascular: RRR, heart sounds normal, no murmur or gallops, no peripheral edema  Musculoskeletal: No deformities, no cyanosis or clubbing  Neuro: alert, awake, non focal  Skin: Warm, no lesions or rash      Assessment & Plan:  Centrilobular emphysema (HCC) Emphysematous change, back to smoking.  He did not miss the Stiolto when we stopped it.  Suspect he will need to be on scheduled BD at some point in the future.  He has albuterol that he very rarely requires.  Post important thing at this point would be to work hard on stopping smoking again.  Discussed this with him today.  Flu shot today  Mediastinal adenopathy Stable on serial CT scans.  No clear indication for EBUS at this time.  Question whether he may have  quiescent sarcoidosis versus old granulomatous disease-he does have some calcified granulomas on CT.  Continue to follow.  We can determine the timing of his next CT scan at our next office visit.  Nasal obstruction With a history of remote fracture and repair.  He is having difficulty moving air especially on inspiration.  I will ask him to see ENT about this.   Baltazar Apo, MD, PhD 06/16/2022, 9:41 AM [facility] [phone] or if no answer before 7:00PM call [phone] For any issues after 7:00PM please call eLink [phone]

## 2022-06-16 NOTE — Assessment & Plan Note (Signed)
With a history of remote fracture and repair.  He is having difficulty moving air especially on inspiration.  I will ask him to see ENT about this.

## 2022-06-16 NOTE — Assessment & Plan Note (Addendum)
Emphysematous change, back to smoking.  He did not miss the Stiolto when we stopped it.  Suspect he will need to be on scheduled BD at some point in the future.  He has albuterol that he very rarely requires.  Post important thing at this point would be to work hard on stopping smoking again.  Discussed this with him today.  Flu shot today

## 2022-06-16 NOTE — Addendum Note (Signed)
Addended by: Dorisann Frames R on: 06/16/2022 09:46 AM   Modules accepted: Orders

## 2022-06-24 NOTE — Progress Notes (Signed)
Office Visit Note  Patient: Hector [name]             Date of Birth: 09-19-1955           MRN: [id number]             [facility]: [facility] Referring: Lucious Groves, DO Visit Date: 07/07/2022 Occupation: _0 @  Subjective:  Positive ANA  History of Present Illness: Hector [name] is a 66 y.o. male seen in consultation per request of Dr. Heber Oldham.  Patient was hospitalized on December 04, 2021 with acute hypoxic respiratory failure.  He was discharged on December 15, 2021.  During that hospitalization he had labs and his ANA was positive.  He was referred to me for further evaluation of positive ANA.  He has known history of COPD and is followed by Dr. Lamonte Sakai.  Dr. Lamonte Sakai also obtain high-resolution CT scan which showed emphysema and pulmonary nodule.  Mediastinal lymph nodes were also noted.  Patient denies any history of oral ulcers, nasal ulcers, Raynaud's phenomenon, inflammatory arthritis or malar rash.  He gives history of joint pain over the years which she describes in his shoulders left hip, knee joints.  He denies any history of joint swelling.  He had right hand fracture in the past requiring surgery.  He also had left total knee replacement by Dr. Gladstone Lighter in the past.  He also gives history of lower back pain for many years.  There is no radiculopathy.  No family history of lupus or rheumatoid arthritis.    Activities of Daily Living:  Patient reports morning stiffness for several hours.   Patient Denies nocturnal pain.  Difficulty dressing/grooming: Denies Difficulty climbing stairs: Denies Difficulty getting out of chair: Denies Difficulty using hands for taps, buttons, cutlery, and/or writing: Denies  Review of Systems  Constitutional:  Positive for fatigue.  HENT:  Positive for mouth dryness. Negative for mouth sores.   Eyes:  Negative for dryness.  Respiratory:  Negative for shortness of breath.   Cardiovascular:  Negative for palpitations.  Gastrointestinal:  Negative  for blood in stool, constipation and diarrhea.  Endocrine: Negative for increased urination.  Genitourinary:  Negative for involuntary urination.  Musculoskeletal:  Positive for joint pain, gait problem, joint pain and morning stiffness. Negative for joint swelling, myalgias, muscle weakness, muscle tenderness and myalgias.  Skin:  Negative for color change, rash and sensitivity to sunlight.  Allergic/Immunologic: Negative for susceptible to infections.  Neurological:  Negative for dizziness and headaches.  Hematological:  Negative for swollen glands.  Psychiatric/Behavioral:  Negative for depressed mood and sleep disturbance. The patient is not nervous/anxious.     PMFS History:  Patient Active Problem List   Diagnosis Date Noted   Nasal obstruction 06/16/2022   Mediastinal adenopathy 01/13/2022   Positive ANA (antinuclear antibody) 12/24/2021   DOE (dyspnea on exertion) 12/17/2021   Centrilobular emphysema ([place]) 12/17/2021   Leukocytosis 12/17/2021   Tobacco use disorder 94/76/5465   Acute diastolic heart failure ([place]) 12/14/2021   Iron deficiency anemia 12/14/2021   Essential hypertension, benign 12/14/2021   New onset atrial fibrillation ([place]) 12/14/2021   Acute hypoxemic respiratory failure ([place]) 12/04/2021   Claudication in peripheral vascular disease ([place]) 09/05/2018   Osteoarthritis of left knee 01/28/2013   Alcohol abuse 01/04/2007   GERD 01/04/2007    Past Medical History:  Diagnosis Date   Arthritis    Claudication in peripheral vascular disease ([facility]) 09/05/2018   GERD (gastroesophageal reflux disease)    Gout  L knee   Hyperlipidemia    Hypertension     Family History  Problem Relation Age of Onset   Cancer Mother    Past Surgical History:  Procedure Laterality Date   ABDOMINAL AORTOGRAM W/LOWER EXTREMITY N/A 09/06/2018   Procedure: ABDOMINAL AORTOGRAM W/LOWER EXTREMITY;  Surgeon: Nigel Mormon, MD;  Location: [facility];  Service:  Cardiovascular;  Laterality: N/A;   FACIAL COSMETIC SURGERY     due ot shattered bones    PERIPHERAL VASCULAR BALLOON ANGIOPLASTY  09/06/2018   Procedure: PERIPHERAL VASCULAR BALLOON ANGIOPLASTY;  Surgeon: Nigel Mormon, MD;  Location: [facility];  Service: Cardiovascular;;  LT SFA   right hand fracture     TOTAL KNEE ARTHROPLASTY Left 04/04/2013   Procedure: LEFT TOTAL KNEE ARTHROPLASTY;  Surgeon: Tobi Bastos, MD;  Location: [facility];  Service: Orthopedics;  Laterality: Left;   Social History   Social History Narrative   Currently resides in apartment in [place] with 2 dogs, [place] and [place]. [facility] helped secure housing, was unhoused for several years prior. 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 Currently retired. Has 2 daughters, Crystal and [place], who reside in [place]. Had 1 son who passed. Sister also resides in [place]. Smokes 1 PPD since teenager, hx of alcohol abuse but not currently.    Immunization History  Administered Date(s) Administered   Fluad Quad(high Dose 65+) 05/02/2021   Influenza,inj,Quad PF,6+ Mos 06/16/2022   PFIZER(Purple Top)SARS-COV-2 Vaccination 08/31/2019, 10/19/2019   Zoster Recombinat (Shingrix) 05/02/2021, 08/21/2021     Objective: Vital Signs: BP (!) 156/99 (BP Location: Left Arm, Patient Position: Sitting, Cuff Size: Normal)   Pulse 88   Resp 18   Ht _0  (1.753 m)   Wt 178 lb 3.2 oz (80.8 kg)   BMI 26.32 kg/m    Physical Exam Vitals and nursing note reviewed.  Constitutional:      Appearance: He is well-developed.  HENT:     Head: Normocephalic and atraumatic.  Eyes:     Conjunctiva/sclera: Conjunctivae normal.     Pupils: Pupils are equal, round, and reactive to light.  Cardiovascular:     Rate and Rhythm: Normal rate and regular rhythm.     Heart sounds: Normal heart sounds.  Pulmonary:     Effort: Pulmonary effort is normal.     Breath sounds: Normal breath sounds.  Abdominal:      General: Bowel sounds are normal.     Palpations: Abdomen is soft.  Musculoskeletal:     Cervical back: Normal range of motion and neck supple.  Skin:    General: Skin is warm and dry.     Capillary Refill: Capillary refill takes less than 2 seconds.  Neurological:     Mental Status: He is alert and oriented to person, place, and time.  Psychiatric:        Behavior: Behavior normal.      Musculoskeletal Exam: Cervical spine was in good range of motion.  He had painful limited range of motion of the lumbar spine.  There was no point tenderness.  Shoulder joints, elbow joints, wrist joints with good range of motion with no synovitis.  He had bilateral clubbing with mild PIP and DIP thickening.  No synovitis was noted.  Hip joints and knee joints were in good range of motion.  Left knee joint was replaced.  Right knee joint was in good range of motion without any warmth  swelling or effusion.  There was no tenderness over ankles or MTPs.  CDAI Exam: CDAI Score: -- Patient Global: --; Provider Global: -- Swollen: --; Tender: -- Joint Exam 07/07/2022   No joint exam has been documented for this visit   There is currently no information documented on the homunculus. Go to the Rheumatology activity and complete the homunculus joint exam.  Investigation: No additional findings.  Imaging: CT Chest Wo Contrast  Result Date: 06/08/2022 CLINICAL DATA:  Lung nodule.  Smoker. EXAM: CT CHEST WITHOUT CONTRAST TECHNIQUE: Multidetector CT imaging of the chest was performed following the standard protocol without IV contrast. RADIATION DOSE REDUCTION: This exam was performed according to the departmental dose-optimization program which includes automated exposure control, adjustment of the mA and/or kV according to patient size and/or use of iterative reconstruction technique. COMPARISON:  12/09/2021 12/04/2021 and 04/06/2013. FINDINGS: Cardiovascular: Atherosclerotic calcification of the aorta, aortic  valve and coronary arteries. Enlarged pulmonic trunk. Heart size normal. No pericardial effusion. Mediastinum/Nodes: Low right paratracheal lymph node measures 13 mm and low left paratracheal lymph node measures 1.6 cm, present dating back to 04/06/2013. Hilar regions are difficult to evaluate without IV contrast. No axillary adenopathy. Esophagus is grossly unremarkable. Lungs/Pleura: Centrilobular and paraseptal emphysema. Calcified granulomas. Mild cylindrical bronchiectasis. 4 mm superior segment left lower lobe nodule (5/81), unchanged from 12/09/2021. No new or suspicious pulmonary nodules. No pleural fluid. Airway is unremarkable. Upper Abdomen: Visualized portions of the liver, adrenal glands, kidneys, spleen, pancreas, stomach and bowel are grossly unremarkable. No upper abdominal adenopathy. Musculoskeletal: Degenerative changes in the spine. IMPRESSION: 1. 4 mm left lower lobe nodule, stable from 12/09/2021. Recommend follow-up on low-dose lung cancer screening CT in 1 year. 2. Mild cylindrical bronchiectasis. 3. Aortic atherosclerosis (ICD10-I70.0). Coronary artery calcification. 4. Enlarged pulmonic trunk, indicative of pulmonary arterial hypertension. 5.  Emphysema (ICD10-J43.9). Electronically Signed   By: Lorin Picket M.D.   On: 06/08/2022 11:53    Recent Labs: Lab Results  Component Value Date   WBC 19.3 (H) 12/15/2021   HGB 16.1 03/02/2022   PLT 517 (H) 12/15/2021   NA 142 03/20/2022   K 4.4 03/20/2022   CL 103 03/20/2022   CO2 22 03/20/2022   GLUCOSE 81 03/20/2022   BUN 11 03/20/2022   CREATININE 0.98 03/20/2022   BILITOT 0.7 12/05/2021   ALKPHOS 60 12/05/2021   AST 24 12/05/2021   ALT 17 12/05/2021   PROT 6.0 (L) 12/05/2021   ALBUMIN 3.1 (L) 12/05/2021   CALCIUM 9.8 03/20/2022   GFRAA 88 01/09/2020    Speciality Comments: No specialty comments available.  Procedures:  No procedures performed Allergies: Patient has no known allergies.   Assessment / Plan:      Visit Diagnoses: Positive ANA (antinuclear antibody) - 12/07/21: ANA (2,4,5,29), CRP 0.7, ESR 9, P-ANCA-, atypical P-ANCA-, C-ANCA- -patient was referred to me for the evaluation of positive ANA.  No titer was given on the ANA.  There is no history of oral ulcers, nasal ulcers, malar rash, photosensitivity, Raynaud's phenomenon or lymphadenopathy.  There is no history of inflammatory arthritis.  He gives history of polyarthralgia.  I will obtain ANA titer and ENA panel.  Plan: ANA, Anti-Smith antibody, Sjogrens syndrome-A extractable nuclear antibody, Sjogrens syndrome-B extractable nuclear antibody, Anti-DNA antibody, double-stranded, RNP Antibody, Anti-scleroderma antibody, C3 and C4  Polyarthralgia-he gives history of chronic discomfort in his shoulders, left hip, knee joints and hands and feet.  No synovitis was noted.  Clubbing of nails-he denies any discomfort in his  DIPs.  Clubbing was noted.  Status post total knee replacement, left -he had good range of motion of his left knee joint.  He gives history of intermittent discomfort and he uses a brace.  By Dr. Gladstone Lighter.  He also has intermittent pain and discomfort in his knee joint.  No warmth, swelling or effusion was noted.  DDD (degenerative disc disease), lumbar-patient gives history of chronic lower back pain without radiculopathy.  I noted degenerative changes on the CT scan of the chest.  I offered physical therapy but he declined.  Advised him to schedule an appointment with Dr. Gladstone Lighter if his lower back pain persist.  Mediastinal adenopathy-noted on the CT scan.  No history of sarcoidosis.  ACE level was normal on December 07, 2021.  Essential hypertension, benign - Blood pressure was elevated at 156/99.  Repeat blood pressure was also elevated.  He was advised to monitor blood pressure closely and follow-up with his PCP.  Other medical problems are listed as follows:  New onset atrial fibrillation ([place])  Claudication in peripheral  vascular disease ([place])  Acute diastolic heart failure ([place])  Acute hypoxemic respiratory failure ([facility])  Centrilobular emphysema (HCC)  History of gastroesophageal reflux (GERD)  History of iron deficiency anemia  Tobacco use disorder - 1PPD X 53 years  Alcohol abuse - Beer and hard liquor 3 days a week.  Orders: Orders Placed This Encounter  Procedures   ANA   Anti-Smith antibody   Sjogrens syndrome-A extractable nuclear antibody   Sjogrens syndrome-B extractable nuclear antibody   Anti-DNA antibody, double-stranded   RNP Antibody   Anti-scleroderma antibody   C3 and C4   No orders of the defined types were placed in this encounter.    Follow-Up Instructions: Return for +ANA.   Bo Merino, MD  Note - This record has been created using Editor, commissioning.  Chart creation errors have been sought, but may not always  have been located. Such creation errors do not reflect on  the standard of medical care.,

## 2022-07-07 ENCOUNTER — Ambulatory Visit: Payer: No Typology Code available for payment source | Attending: Rheumatology | Admitting: Rheumatology

## 2022-07-07 ENCOUNTER — Telehealth: Payer: Self-pay

## 2022-07-07 ENCOUNTER — Encounter: Payer: Self-pay | Admitting: Rheumatology

## 2022-07-07 VITALS — BP 156/99 | HR 88 | Resp 18 | Ht 69.0 in | Wt 178.2 lb

## 2022-07-07 DIAGNOSIS — M5136 Other intervertebral disc degeneration, lumbar region: Secondary | ICD-10-CM

## 2022-07-07 DIAGNOSIS — J9601 Acute respiratory failure with hypoxia: Secondary | ICD-10-CM

## 2022-07-07 DIAGNOSIS — I739 Peripheral vascular disease, unspecified: Secondary | ICD-10-CM

## 2022-07-07 DIAGNOSIS — I4891 Unspecified atrial fibrillation: Secondary | ICD-10-CM

## 2022-07-07 DIAGNOSIS — Z96652 Presence of left artificial knee joint: Secondary | ICD-10-CM

## 2022-07-07 DIAGNOSIS — F1010 Alcohol abuse, uncomplicated: Secondary | ICD-10-CM

## 2022-07-07 DIAGNOSIS — R590 Localized enlarged lymph nodes: Secondary | ICD-10-CM

## 2022-07-07 DIAGNOSIS — R683 Clubbing of fingers: Secondary | ICD-10-CM

## 2022-07-07 DIAGNOSIS — I5031 Acute diastolic (congestive) heart failure: Secondary | ICD-10-CM

## 2022-07-07 DIAGNOSIS — M2550 Pain in unspecified joint: Secondary | ICD-10-CM

## 2022-07-07 DIAGNOSIS — F17200 Nicotine dependence, unspecified, uncomplicated: Secondary | ICD-10-CM

## 2022-07-07 DIAGNOSIS — R768 Other specified abnormal immunological findings in serum: Secondary | ICD-10-CM

## 2022-07-07 DIAGNOSIS — Z862 Personal history of diseases of the blood and blood-forming organs and certain disorders involving the immune mechanism: Secondary | ICD-10-CM

## 2022-07-07 DIAGNOSIS — M1712 Unilateral primary osteoarthritis, left knee: Secondary | ICD-10-CM

## 2022-07-07 DIAGNOSIS — I10 Essential (primary) hypertension: Secondary | ICD-10-CM

## 2022-07-07 DIAGNOSIS — J432 Centrilobular emphysema: Secondary | ICD-10-CM

## 2022-07-07 DIAGNOSIS — Z8719 Personal history of other diseases of the digestive system: Secondary | ICD-10-CM

## 2022-07-07 DIAGNOSIS — M51369 Other intervertebral disc degeneration, lumbar region without mention of lumbar back pain or lower extremity pain: Secondary | ICD-10-CM

## 2022-07-07 NOTE — Telephone Encounter (Signed)
Patient was seen as a new patient today by Dr. Estanislado Pandy. Per Dr. Estanislado Pandy, if ANA result comes back negative, we can call patient with results and no follow up is needed. Thanks!

## 2022-07-09 LAB — ANTI-NUCLEAR AB-TITER (ANA TITER): ANA Titer 1: 1:40 {titer} — ABNORMAL HIGH

## 2022-07-09 LAB — RNP ANTIBODY: Ribonucleic Protein(ENA) Antibody, IgG: <1 AI

## 2022-07-09 LAB — ANTI-DNA ANTIBODY, DOUBLE-STRANDED: ds DNA Ab: <1 IU/mL

## 2022-07-09 LAB — SJOGRENS SYNDROME-A EXTRACTABLE NUCLEAR ANTIBODY: SSA (Ro) (ENA) Antibody, IgG: <1 AI

## 2022-07-09 LAB — C3 AND C4
C3 Complement: 138 mg/dL (ref 82–185)
C4 Complement: 38 mg/dL (ref 15–53)

## 2022-07-09 LAB — ANTI-SCLERODERMA ANTIBODY: Scleroderma (Scl-70) (ENA) Antibody, IgG: <1 AI

## 2022-07-09 LAB — ANTI-SMITH ANTIBODY: ENA SM Ab Ser-aCnc: <1 AI

## 2022-07-09 LAB — SJOGRENS SYNDROME-B EXTRACTABLE NUCLEAR ANTIBODY: SSB (La) (ENA) Antibody, IgG: <1 AI

## 2022-07-09 LAB — ANA: Anti Nuclear Antibody (ANA): POSITIVE — AB

## 2022-07-09 NOTE — Progress Notes (Signed)
I will discuss results at the follow-up visit.

## 2022-07-10 NOTE — Telephone Encounter (Signed)
Results of ANA were positive. Dr. Estanislado Pandy to discuss on 07/28/2022.

## 2022-07-14 NOTE — Progress Notes (Signed)
Office Visit Note  Patient: Hector [name]             Date of Birth: 07-21-1955           MRN: [id number]             PCP: [facility] Referring: [facility] Visit Date: 07/28/2022 Occupation: @GUAROCC @  Subjective:  Pain in multiple joints and positive ANA  History of Present Illness: Hector [name] is a 67 y.o. male with history of osteoarthritis and positive ANA.  He states he continues to have pain and discomfort in his neck, lower back, shoulders, hands and his knee joints.  He denies any history of joint swelling.  He denies any history of oral ulcers, nasal ulcers, malar rash, photosensitivity, inflammatory arthritis or lymphadenopathy.  He states his knee joint arthritis and lower back pain keeps him from doing routine activities.    Activities of Daily Living:  Patient reports morning stiffness for 30 minutes.   Patient Denies nocturnal pain.  Difficulty dressing/grooming: Denies Difficulty climbing stairs: Denies Difficulty getting out of chair: Denies Difficulty using hands for taps, buttons, cutlery, and/or writing: Denies  Review of Systems  Constitutional:  Negative for fatigue.  HENT:  Negative for mouth sores and mouth dryness.   Eyes:  Positive for dryness.  Respiratory:  Negative for difficulty breathing.   Cardiovascular:  Negative for chest pain and palpitations.  Gastrointestinal:  Negative for blood in stool, constipation and diarrhea.  Endocrine: Negative for increased urination.  Genitourinary:  Negative for involuntary urination.  Musculoskeletal:  Positive for joint pain, joint pain and morning stiffness. Negative for gait problem, joint swelling, myalgias, muscle weakness, muscle tenderness and myalgias.  Skin:  Negative for color change, hair loss and sensitivity to sunlight.  Allergic/Immunologic: Negative for susceptible to infections.  Neurological:  Negative for dizziness and headaches.  Hematological:  Negative for swollen  glands.  Psychiatric/Behavioral:  Negative for depressed mood and sleep disturbance. The patient is not nervous/anxious.     PMFS History:  Patient Active Problem List   Diagnosis Date Noted   Nasal obstruction 06/16/2022   Mediastinal adenopathy 01/13/2022   Positive ANA (antinuclear antibody) 12/24/2021   DOE (dyspnea on exertion) 12/17/2021   Centrilobular emphysema (HCC) 12/17/2021   Leukocytosis 12/17/2021   Tobacco use disorder 12/14/2021   Acute diastolic heart failure (HCC) 12/14/2021   Iron deficiency anemia 12/14/2021   Essential hypertension, benign 12/14/2021   New onset atrial fibrillation (HCC) 12/14/2021   Acute hypoxemic respiratory failure (HCC) 12/04/2021   Claudication in peripheral vascular disease (HCC) 09/05/2018   Osteoarthritis of left knee 01/28/2013   Alcohol abuse 01/04/2007   GERD 01/04/2007    Past Medical History:  Diagnosis Date   Arthritis    Claudication in peripheral vascular disease (HCC) 09/05/2018   GERD (gastroesophageal reflux disease)    Gout    L knee   Hyperlipidemia    Hypertension     Family History  Problem Relation Age of Onset   Cancer Mother    Past Surgical History:  Procedure Laterality Date   ABDOMINAL AORTOGRAM W/LOWER EXTREMITY N/A 09/06/2018   Procedure: ABDOMINAL AORTOGRAM W/LOWER EXTREMITY;  Surgeon: 11/06/2018, MD;  Location: [facility];  Service: Cardiovascular;  Laterality: N/A;   FACIAL COSMETIC SURGERY     due ot shattered bones    PERIPHERAL VASCULAR BALLOON ANGIOPLASTY  09/06/2018   Procedure: PERIPHERAL VASCULAR BALLOON ANGIOPLASTY;  Surgeon: 11/06/2018, MD;  Location: [facility];  Service: Cardiovascular;;  LT SFA   right hand fracture     TOTAL KNEE ARTHROPLASTY Left 04/04/2013   Procedure: LEFT TOTAL KNEE ARTHROPLASTY;  Surgeon: Tobi Bastos, MD;  Location: [facility];  Service: Orthopedics;  Laterality: Left;   Social History   Social History Narrative   Currently  resides in apartment in [place] with 2 dogs, [place] and [place]. [facility], was unhoused for several years prior. 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 Currently retired. Has 2 daughters, Crystal and [place], who reside in [place]. Had 1 son who passed. Sister also resides in [place]. Smokes 1 PPD since teenager, hx of alcohol abuse but not currently.    Immunization History  Administered Date(s) Administered   Fluad Quad(high Dose 65+) 05/02/2021   Influenza,inj,Quad PF,6+ Mos 06/16/2022   PFIZER(Purple Top)SARS-COV-2 Vaccination 08/31/2019, 10/19/2019   Zoster Recombinat (Shingrix) 05/02/2021, 08/21/2021     Objective: Vital Signs: BP (!) 140/87 (BP Location: Left Arm, Patient Position: Sitting, Cuff Size: Normal)   Pulse 84   Resp 18   Ht 5\' 9"  (1.753 m)   Wt 178 lb 12.8 oz (81.1 kg)   BMI 26.40 kg/m    Physical Exam Vitals and nursing note reviewed.  Constitutional:      Appearance: He is well-developed.  HENT:     Head: Normocephalic and atraumatic.  Eyes:     Conjunctiva/sclera: Conjunctivae normal.     Pupils: Pupils are equal, round, and reactive to light.  Cardiovascular:     Rate and Rhythm: Normal rate and regular rhythm.     Heart sounds: Normal heart sounds.  Pulmonary:     Effort: Pulmonary effort is normal.     Breath sounds: Normal breath sounds.  Abdominal:     General: Bowel sounds are normal.     Palpations: Abdomen is soft.  Musculoskeletal:     Cervical back: Normal range of motion and neck supple.  Skin:    General: Skin is warm and dry.     Capillary Refill: Capillary refill takes less than 2 seconds.  Neurological:     Mental Status: He is alert and oriented to person, place, and time.  Psychiatric:        Behavior: Behavior normal.      Musculoskeletal Exam: Good range of motion of the cervical spine with some stiffness.  He had painful limited range of motion of the lumbar spine.  He had discomfort  range of motion of bilateral shoulder joints mostly in the subacromial region.  Elbow joints and wrist joints with good range of motion.  Bilateral PIP and DIP thickening with clubbing of his bilateral fingernails were noted.  Hip joints and knee joints in good range of motion.  Left knee joint was replaced.  There was no tenderness over ankles or MTPs.  CDAI Exam: CDAI Score: -- Patient Global: --; Provider Global: -- Swollen: --; Tender: -- Joint Exam 07/28/2022   No joint exam has been documented for this visit   There is currently no information documented on the homunculus. Go to the Rheumatology activity and complete the homunculus joint exam.  Investigation: No additional findings.  Imaging: No results found.  Recent Labs: Lab Results  Component Value Date   WBC 19.3 (H) 12/15/2021   HGB 16.1 03/02/2022   PLT 517 (H) 12/15/2021   NA 142 03/20/2022   K 4.4 03/20/2022   CL 103  03/20/2022   CO2 22 03/20/2022   GLUCOSE 81 03/20/2022   BUN 11 03/20/2022   CREATININE 0.98 03/20/2022   BILITOT 0.7 12/05/2021   ALKPHOS 60 12/05/2021   AST 24 12/05/2021   ALT 17 12/05/2021   PROT 6.0 (L) 12/05/2021   ALBUMIN 3.1 (L) 12/05/2021   CALCIUM 9.8 03/20/2022   GFRAA 88 01/09/2020   July 07, 2022 ANA 1: 40NS, ENA (Smith, SSA, SSB, dsDNA, RNP, SCL 70) negative, C3-C4 normal  12/07/21: ANA (2,4,5,29), CRP 0.7, ESR 9, P-ANCA-, atypical P-ANCA-, C-ANCA-   Speciality Comments: No specialty comments available.  Procedures:  No procedures performed Allergies: Patient has no known allergies.   Assessment / Plan:     Visit Diagnoses: Positive ANA (antinuclear antibody) - ANA is low titer positive and not significant.  ENA negative and complements were normal.  Lab results were discussed with the patient at length.  He denies any history of oral ulcers, nasal ulcers, malar rash, photosensitivity, Raynaud's phenomenon, lymphadenopathy or inflammatory arthritis.  No synovitis was noted.    Patient has no clinical features of autoimmune disease.  No further workup is needed.  He was advised to contact us if he develops any new symptoms.  Polyarthralgia - History of pain in multiple joints including left shoulder, bilateral hands, left hip, bilateral knees knee and both feet.  No synovitis was noted.  I offered x-rays of the cervical spine and shoulder joints but patient declined.  I offered physical therapy but patient declined.  I gave him a handout on cervical spine exercises, back exercises and shoulder joint exercises.  I advised him to contact me if his symptoms get worse or he develops any joint swelling.  Clubbing of nails-due to chronic lung disease.  Status post total knee replacement, left - By Dr. Darrelyn Hillock.  He has intermittent discomfort.  No warmth swelling or effusion was noted.  DDD (degenerative disc disease), lumbar - History of chronic lower back pain without radiculopathy.  A handout on back exercises was given.  He declined physical therapy.  He plans to follow-up with Dr. Darrelyn Hillock.  Other medical problems are listed as follows:  Mediastinal adenopathy - No history of sarcoidosis.  ACE normal.  Claudication in peripheral vascular disease (HCC)  New onset atrial fibrillation (HCC)  Essential hypertension, benign-blood pressure was 140/87 today.  Acute diastolic heart failure (HCC)  Centrilobular emphysema (HCC)  Acute hypoxemic respiratory failure (HCC)  History of gastroesophageal reflux (GERD)  History of iron deficiency anemia  Tobacco use disorder - Pack per day for 53 years.  Alcohol abuse - Beer and hard liquor 3 times a week.  Orders: No orders of the defined types were placed in this encounter.  No orders of the defined types were placed in this encounter.   Follow-Up Instructions: Return if symptoms worsen or fail to improve, for Osteoarthritis.   Pollyann Savoy, MD  Note - This record has been created using Animal nutritionist.   Chart creation errors have been sought, but may not always  have been located. Such creation errors do not reflect on  the standard of medical care.

## 2022-07-28 ENCOUNTER — Encounter: Payer: Self-pay | Admitting: Rheumatology

## 2022-07-28 ENCOUNTER — Ambulatory Visit: Payer: No Typology Code available for payment source | Attending: Rheumatology | Admitting: Rheumatology

## 2022-07-28 VITALS — BP 140/87 | HR 84 | Resp 18 | Ht 69.0 in | Wt 178.8 lb

## 2022-07-28 DIAGNOSIS — I4891 Unspecified atrial fibrillation: Secondary | ICD-10-CM

## 2022-07-28 DIAGNOSIS — F17200 Nicotine dependence, unspecified, uncomplicated: Secondary | ICD-10-CM

## 2022-07-28 DIAGNOSIS — R768 Other specified abnormal immunological findings in serum: Secondary | ICD-10-CM

## 2022-07-28 DIAGNOSIS — Z8719 Personal history of other diseases of the digestive system: Secondary | ICD-10-CM

## 2022-07-28 DIAGNOSIS — J9601 Acute respiratory failure with hypoxia: Secondary | ICD-10-CM

## 2022-07-28 DIAGNOSIS — M5136 Other intervertebral disc degeneration, lumbar region: Secondary | ICD-10-CM

## 2022-07-28 DIAGNOSIS — J432 Centrilobular emphysema: Secondary | ICD-10-CM

## 2022-07-28 DIAGNOSIS — M51369 Other intervertebral disc degeneration, lumbar region without mention of lumbar back pain or lower extremity pain: Secondary | ICD-10-CM

## 2022-07-28 DIAGNOSIS — I739 Peripheral vascular disease, unspecified: Secondary | ICD-10-CM

## 2022-07-28 DIAGNOSIS — I5031 Acute diastolic (congestive) heart failure: Secondary | ICD-10-CM

## 2022-07-28 DIAGNOSIS — Z96652 Presence of left artificial knee joint: Secondary | ICD-10-CM

## 2022-07-28 DIAGNOSIS — I10 Essential (primary) hypertension: Secondary | ICD-10-CM

## 2022-07-28 DIAGNOSIS — Z862 Personal history of diseases of the blood and blood-forming organs and certain disorders involving the immune mechanism: Secondary | ICD-10-CM

## 2022-07-28 DIAGNOSIS — F1010 Alcohol abuse, uncomplicated: Secondary | ICD-10-CM

## 2022-07-28 DIAGNOSIS — R683 Clubbing of fingers: Secondary | ICD-10-CM

## 2022-07-28 DIAGNOSIS — R7689 Other specified abnormal immunological findings in serum: Secondary | ICD-10-CM

## 2022-07-28 DIAGNOSIS — R590 Localized enlarged lymph nodes: Secondary | ICD-10-CM

## 2022-07-28 DIAGNOSIS — M2550 Pain in unspecified joint: Secondary | ICD-10-CM

## 2022-07-28 NOTE — Patient Instructions (Signed)
Cervical Strain and Sprain Rehab Ask your health care provider which exercises are safe for you. Do exercises exactly as told by your health care provider and adjust them as directed. It is normal to feel mild stretching, pulling, tightness, or discomfort as you do these exercises. Stop right away if you feel sudden pain or your pain gets worse. Do not begin these exercises until told by your health care provider. Stretching and range-of-motion exercises Cervical side bending  Using good posture, sit on a stable chair or stand up. Without moving your shoulders, slowly tilt your left / right ear to your shoulder until you feel a stretch in the neck muscles on the opposite side. You should be looking straight ahead. Hold for __________ seconds. Repeat with the other side of your neck. Repeat __________ times. Complete this exercise __________ times a day. Cervical rotation  Using good posture, sit on a stable chair or stand up. Slowly turn your head to the side as if you are looking over your left / right shoulder. Keep your eyes level with the ground. Stop when you feel a stretch along the side and the back of your neck. Hold for __________ seconds. Repeat this by turning to your other side. Repeat __________ times. Complete this exercise __________ times a day. Thoracic extension and pectoral stretch  Roll a towel or a small blanket so it is about 4 inches (10 cm) in diameter. Lie down on your back on a firm surface. Put the towel in the middle of your back across your spine. It should not be under your shoulder blades. Put your hands behind your head and let your elbows fall out to your sides. Hold for __________ seconds. Repeat __________ times. Complete this exercise __________ times a day. Strengthening exercises Upper cervical flexion  Lie on your back with a thin pillow behind your head or a small, rolled-up towel under your neck. Gently tuck your chin toward your chest and nod  your head down to look toward your feet. Do not lift your head off the pillow. Hold for __________ seconds. Release the tension slowly. Relax your neck muscles completely before you repeat this exercise. Repeat __________ times. Complete this exercise __________ times a day. Cervical extension  Stand about 6 inches (15 cm) away from a wall, with your back facing the wall. Place a soft object, about 6-8 inches (15-20 cm) in diameter, between the back of your head and the wall. A soft object could be a small pillow, a ball, or a folded towel. Gently tilt your head back and press into the soft object. Keep your jaw and forehead relaxed. Hold for __________ seconds. Release the tension slowly. Relax your neck muscles completely before you repeat this exercise. Repeat __________ times. Complete this exercise __________ times a day. Posture and body mechanics Body mechanics refer to the movements and positions of your body while you do your daily activities. Posture is part of body mechanics. Good posture and healthy body mechanics can help to relieve stress in your body's tissues and joints. Good posture means that your spine is in its natural S-curve position (your spine is neutral), your shoulders are pulled back slightly, and your head is not tipped forward. The following are general guidelines for using improved posture and body mechanics in your everyday activities. Sitting  When sitting, keep your spine neutral and keep your feet flat on the floor. Use a footrest, if needed, and keep your thighs parallel to the floor. Avoid rounding  your shoulders. Avoid tilting your head forward. When working at a desk or a computer, keep your desk at a height where your hands are slightly lower than your elbows. Slide your chair under your desk so you are close enough to maintain good posture. When working at a computer, place your monitor at a height where you are looking straight ahead and you do not have to  tilt your head forward or downward to look at the screen. Standing  When standing, keep your spine neutral and keep your feet about hip-width apart. Keep a slight bend in your knees. Your ears, shoulders, and hips should line up. When you do a task in which you stand in one place for a long time, place one foot up on a stable object that is 2-4 inches (5-10 cm) high, such as a footstool. This helps keep your spine neutral. Resting When lying down and resting, avoid positions that are most painful for you. Try to support your neck in a neutral position. You can use a contour pillow or a small rolled-up towel. Your pillow should support your neck but not push on it. This information is not intended to replace advice given to you by your health care provider. Make sure you discuss any questions you have with your health care provider. Document Revised: 01/12/2022 Document Reviewed: 01/12/2022 Elsevier Patient Education  [place]. Shoulder Exercises Ask your health care provider which exercises are safe for you. Do exercises exactly as told by your health care provider and adjust them as directed. It is normal to feel mild stretching, pulling, tightness, or discomfort as you do these exercises. Stop right away if you feel sudden pain or your pain gets worse. Do not begin these exercises until told by your health care provider. Stretching exercises External rotation and abduction This exercise is sometimes called corner stretch. The exercise rotates your arm outward (external rotation) and moves your arm out from your body (abduction). Stand in a doorway with one of your feet slightly in front of the other. This is called a staggered stance. If you cannot reach your forearms to the door frame, stand facing a corner of a room. Choose one of the following positions as told by your health care provider: Place your hands and forearms on the door frame above your head. Place your hands and forearms  on the door frame at the height of your head. Place your hands on the door frame at the height of your elbows. Slowly move your weight onto your front foot until you feel a stretch across your chest and in the front of your shoulders. Keep your head and chest upright and keep your abdominal muscles tight. Hold for __________ seconds. To release the stretch, shift your weight to your back foot. Repeat __________ times. Complete this exercise __________ times a day. Extension, standing  Stand and hold a broomstick, a cane, or a similar object behind your back. Your hands should be a little wider than shoulder-width apart. Your palms should face away from your back. Keeping your elbows straight and your shoulder muscles relaxed, move the stick away from your body until you feel a stretch in your shoulders (extension). Avoid shrugging your shoulders while you move the stick. Keep your shoulder blades tucked down toward the middle of your back. Hold for __________ seconds. Slowly return to the starting position. Repeat __________ times. Complete this exercise __________ times a day. Range-of-motion exercises Pendulum  Stand near a wall or a  surface that you can hold onto for balance. Bend at the waist and let your left / right arm hang straight down. Use your other arm to support you. Keep your back straight and do not lock your knees. Relax your left / right arm and shoulder muscles, and move your hips and your trunk so your left / right arm swings freely. Your arm should swing because of the motion of your body, not because you are using your arm or shoulder muscles. Keep moving your hips and trunk so your arm swings in the following directions, as told by your health care provider: Side to side. Forward and backward. In clockwise and counterclockwise circles. Continue each motion for __________ seconds, or for as long as told by your health care provider. Slowly return to the starting  position. Repeat __________ times. Complete this exercise __________ times a day. Shoulder flexion, standing  Stand and hold a broomstick, a cane, or a similar object. Place your hands a little more than shoulder-width apart on the object. Your left / right hand should be palm-up, and your other hand should be palm-down. Keep your elbow straight and your shoulder muscles relaxed. Push the stick up with your healthy arm to raise your left / right arm in front of your body, and then over your head until you feel a stretch in your shoulder (flexion). Avoid shrugging your shoulder while you raise your arm. Keep your shoulder blade tucked down toward the middle of your back. Hold for __________ seconds. Slowly return to the starting position. Repeat __________ times. Complete this exercise __________ times a day. Shoulder abduction, standing  Stand and hold a broomstick, a cane, or a similar object. Place your hands a little more than shoulder-width apart on the object. Your left / right hand should be palm-up, and your other hand should be palm-down. Keep your elbow straight and your shoulder muscles relaxed. Push the object across your body toward your left / right side. Raise your left / right arm to the side of your body (abduction) until you feel a stretch in your shoulder. Do not raise your arm above shoulder height unless your health care provider tells you to do that. If directed, raise your arm over your head. Avoid shrugging your shoulder while you raise your arm. Keep your shoulder blade tucked down toward the middle of your back. Hold for __________ seconds. Slowly return to the starting position. Repeat __________ times. Complete this exercise __________ times a day. Internal rotation  Place your left / right hand behind your back, palm-up. Use your other hand to dangle an exercise band, a broomstick, or a similar object over your shoulder. Grasp the band with your left / right hand so  you are holding on to both ends. Gently pull up on the band until you feel a stretch in the front of your left / right shoulder. The movement of your arm toward the center of your body is called internal rotation. Avoid shrugging your shoulder while you raise your arm. Keep your shoulder blade tucked down toward the middle of your back. Hold for __________ seconds. Release the stretch by letting go of the band and lowering your hands. Repeat __________ times. Complete this exercise __________ times a day. Strengthening exercises External rotation  Sit in a stable chair without armrests. Secure an exercise band to a stable object at elbow height on your left / right side. Place a soft object, such as a folded towel or a small  pillow, between your left / right upper arm and your body to move your elbow about 4 inches (10 cm) away from your side. Hold the end of the exercise band so it is tight and there is no slack. Keeping your elbow pressed against the soft object, slowly move your forearm out, away from your abdomen (external rotation). Keep your body steady so only your forearm moves. Hold for __________ seconds. Slowly return to the starting position. Repeat __________ times. Complete this exercise __________ times a day. Shoulder abduction  Sit in a stable chair without armrests, or stand up. Hold a __________ lb / kg weight in your left / right hand, or hold an exercise band with both hands. Start with your arms straight down and your left / right palm facing in, toward your body. Slowly lift your left / right hand out to your side (abduction). Do not lift your hand above shoulder height unless your health care provider tells you that this is safe. Keep your arms straight. Avoid shrugging your shoulder while you do this movement. Keep your shoulder blade tucked down toward the middle of your back. Hold for __________ seconds. Slowly lower your arm, and return to the starting  position. Repeat __________ times. Complete this exercise __________ times a day. Shoulder extension  Sit in a stable chair without armrests, or stand up. Secure an exercise band to a stable object in front of you so it is at shoulder height. Hold one end of the exercise band in each hand. Straighten your elbows and lift your hands up to shoulder height. Squeeze your shoulder blades together as you pull your hands down to the sides of your thighs (extension). Stop when your hands are straight down by your sides. Do not let your hands go behind your body. Hold for __________ seconds. Slowly return to the starting position. Repeat __________ times. Complete this exercise __________ times a day. Shoulder row  Sit in a stable chair without armrests, or stand up. Secure an exercise band to a stable object in front of you so it is at chest height. Hold one end of the exercise band in each hand. Position your palms so that your thumbs are facing the ceiling (neutral position). Bend each of your elbows to a 90-degree angle (right angle) and keep your upper arms at your sides. Step back or move the chair back until the band is tight and there is no slack. Slowly pull your elbows back behind you. Hold for __________ seconds. Slowly return to the starting position. Repeat __________ times. Complete this exercise __________ times a day. Shoulder press-ups  Sit in a stable chair that has armrests. Sit upright, with your feet flat on the floor. Put your hands on the armrests so your elbows are bent and your fingers are pointing forward. Your hands should be about even with the sides of your body. Push down on the armrests and use your arms to lift yourself off the chair. Straighten your elbows and lift yourself up as much as you comfortably can. Move your shoulder blades down, and avoid letting your shoulders move up toward your ears. Keep your feet on the ground. As you get stronger, your feet should  support less of your body weight as you lift yourself up. Hold for __________ seconds. Slowly lower yourself back into the chair. Repeat __________ times. Complete this exercise __________ times a day. Wall push-ups  Stand so you are facing a stable wall. Your feet should be about  one arm-length away from the wall. Lean forward and place your palms on the wall at shoulder height. Keep your feet flat on the floor as you bend your elbows and lean forward toward the wall. Hold for __________ seconds. Straighten your elbows to push yourself back to the starting position. Repeat __________ times. Complete this exercise __________ times a day. This information is not intended to replace advice given to you by your health care provider. Make sure you discuss any questions you have with your health care provider. Document Revised: 08/12/2021 Document Reviewed: 08/12/2021 Elsevier Patient Education  [place].  Back Exercises The following exercises strengthen the muscles that help to support the trunk (torso) and back. They also help to keep the lower back flexible. Doing these exercises can help to prevent or lessen existing low back pain. If you have back pain or discomfort, try doing these exercises 2-3 times each day or as told by your health care provider. As your pain improves, do them once each day, but increase the number of times that you repeat the steps for each exercise (do more repetitions). To prevent the recurrence of back pain, continue to do these exercises once each day or as told by your health care provider. Do exercises exactly as told by your health care provider and adjust them as directed. It is normal to feel mild stretching, pulling, tightness, or discomfort as you do these exercises, but you should stop right away if you feel sudden pain or your pain gets worse. Exercises Single knee to chest Repeat these steps 3-5 times for each leg: Lie on your back on a firm  bed or the floor with your legs extended. Bring one knee to your chest. Your other leg should stay extended and in contact with the floor. Hold your knee in place by grabbing your knee or thigh with both hands and hold. Pull on your knee until you feel a gentle stretch in your lower back or buttocks. Hold the stretch for 10-30 seconds. Slowly release and straighten your leg.  Pelvic tilt Repeat these steps 5-10 times: Lie on your back on a firm bed or the floor with your legs extended. Bend your knees so they are pointing toward the ceiling and your feet are flat on the floor. Tighten your lower abdominal muscles to press your lower back against the floor. This motion will tilt your pelvis so your tailbone points up toward the ceiling instead of pointing to your feet or the floor. With gentle tension and even breathing, hold this position for 5-10 seconds.  Cat-cow Repeat these steps until your lower back becomes more flexible: Get into a hands-and-knees position on a firm bed or the floor. Keep your hands under your shoulders, and keep your knees under your hips. You may place padding under your knees for comfort. Let your head hang down toward your chest. Contract your abdominal muscles and point your tailbone toward the floor so your lower back becomes rounded like the back of a cat. Hold this position for 5 seconds. Slowly lift your head, let your abdominal muscles relax, and point your tailbone up toward the ceiling so your back forms a sagging arch like the back of a cow. Hold this position for 5 seconds.  Press-ups Repeat these steps 5-10 times: Lie on your abdomen (face-down) on a firm bed or the floor. Place your palms near your head, about shoulder-width apart. Keeping your back as relaxed as possible and keeping your hips  on the floor, slowly straighten your arms to raise the top half of your body and lift your shoulders. Do not use your back muscles to raise your upper torso.  You may adjust the placement of your hands to make yourself more comfortable. Hold this position for 5 seconds while you keep your back relaxed. Slowly return to lying flat on the floor.  Bridges Repeat these steps 10 times: Lie on your back on a firm bed or the floor. Bend your knees so they are pointing toward the ceiling and your feet are flat on the floor. Your arms should be flat at your sides, next to your body. Tighten your buttocks muscles and lift your buttocks off the floor until your waist is at almost the same height as your knees. You should feel the muscles working in your buttocks and the back of your thighs. If you do not feel these muscles, slide your feet 1-2 inches (2.5-5 cm) farther away from your buttocks. Hold this position for 3-5 seconds. Slowly lower your hips to the starting position, and allow your buttocks muscles to relax completely. If this exercise is too easy, try doing it with your arms crossed over your chest. Abdominal crunches Repeat these steps 5-10 times: Lie on your back on a firm bed or the floor with your legs extended. Bend your knees so they are pointing toward the ceiling and your feet are flat on the floor. Cross your arms over your chest. Tip your chin slightly toward your chest without bending your neck. Tighten your abdominal muscles and slowly raise your torso high enough to lift your shoulder blades a tiny bit off the floor. Avoid raising your torso higher than that because it can put too much stress on your lower back and does not help to strengthen your abdominal muscles. Slowly return to your starting position.  Back lifts Repeat these steps 5-10 times: Lie on your abdomen (face-down) with your arms at your sides, and rest your forehead on the floor. Tighten the muscles in your legs and your buttocks. Slowly lift your chest off the floor while you keep your hips pressed to the floor. Keep the back of your head in line with the curve in  your back. Your eyes should be looking at the floor. Hold this position for 3-5 seconds. Slowly return to your starting position.  Contact a health care provider if: Your back pain or discomfort gets much worse when you do an exercise. Your worsening back pain or discomfort does not lessen within 2 hours after you exercise. If you have any of these problems, stop doing these exercises right away. Do not do them again unless your health care provider says that you can. Get help right away if: You develop sudden, severe back pain. If this happens, stop doing the exercises right away. Do not do them again unless your health care provider says that you can. This information is not intended to replace advice given to you by your health care provider. Make sure you discuss any questions you have with your health care provider. Document Revised: 12/17/2020 Document Reviewed: 09/04/2020 Elsevier Patient Education  [place].

## 2023-01-28 ENCOUNTER — Other Ambulatory Visit: Payer: Self-pay

## 2023-01-28 ENCOUNTER — Encounter (HOSPITAL_COMMUNITY): Payer: Self-pay | Admitting: *Deleted

## 2023-01-28 ENCOUNTER — Emergency Department (HOSPITAL_COMMUNITY)
Admission: EM | Admit: 2023-01-28 | Discharge: 2023-01-28 | Disposition: A | Discharge status: Home / Self Care | Payer: No Typology Code available for payment source | Attending: Emergency Medicine | Admitting: Emergency Medicine

## 2023-01-28 ENCOUNTER — Emergency Department (HOSPITAL_COMMUNITY): Payer: No Typology Code available for payment source

## 2023-01-28 DIAGNOSIS — I4891 Unspecified atrial fibrillation: Secondary | ICD-10-CM | POA: Diagnosis not present

## 2023-01-28 DIAGNOSIS — Z7901 Long term (current) use of anticoagulants: Secondary | ICD-10-CM | POA: Insufficient documentation

## 2023-01-28 DIAGNOSIS — F17210 Nicotine dependence, cigarettes, uncomplicated: Secondary | ICD-10-CM | POA: Diagnosis not present

## 2023-01-28 DIAGNOSIS — Z7982 Long term (current) use of aspirin: Secondary | ICD-10-CM | POA: Diagnosis not present

## 2023-01-28 DIAGNOSIS — I509 Heart failure, unspecified: Secondary | ICD-10-CM | POA: Diagnosis not present

## 2023-01-28 DIAGNOSIS — Z79899 Other long term (current) drug therapy: Secondary | ICD-10-CM | POA: Diagnosis not present

## 2023-01-28 DIAGNOSIS — R0789 Other chest pain: Secondary | ICD-10-CM | POA: Insufficient documentation

## 2023-01-28 DIAGNOSIS — I110 Hypertensive heart disease with heart failure: Secondary | ICD-10-CM | POA: Diagnosis not present

## 2023-01-28 DIAGNOSIS — E876 Hypokalemia: Secondary | ICD-10-CM | POA: Diagnosis not present

## 2023-01-28 LAB — BASIC METABOLIC PANEL
Anion gap: 13 (ref 5–15)
BUN: 11 mg/dL (ref 8–23)
CO2: 17 mmol/L — ABNORMAL LOW (ref 22–32)
Calcium: 9.1 mg/dL (ref 8.9–10.3)
Chloride: 106 mmol/L (ref 98–111)
Creatinine, Ser: 0.96 mg/dL (ref 0.61–1.24)
GFR, Estimated: >60 mL/min (ref >60)
Glucose, Bld: 100 mg/dL — ABNORMAL HIGH (ref 70–99)
Potassium: 3.3 mmol/L — ABNORMAL LOW (ref 3.5–5.1)
Sodium: 136 mmol/L (ref 135–145)

## 2023-01-28 LAB — CBC
HCT: 52 % (ref 39.0–52.0)
Hemoglobin: 17.7 g/dL — ABNORMAL HIGH (ref 13.0–17.0)
MCH: 29.1 pg (ref 26.0–34.0)
MCHC: 34 g/dL (ref 30.0–36.0)
MCV: 85.4 fL (ref 80.0–100.0)
Platelets: 288 10*3/uL (ref 150–400)
RBC: 6.09 MIL/uL — ABNORMAL HIGH (ref 4.22–5.81)
RDW: 13.1 % (ref 11.5–15.5)
WBC: 8.3 10*3/uL (ref 4.0–10.5)
nRBC: 0 % (ref 0.0–0.2)

## 2023-01-28 LAB — MAGNESIUM: Magnesium: 1.9 mg/dL (ref 1.7–2.4)

## 2023-01-28 LAB — TROPONIN I (HIGH SENSITIVITY)
Troponin I (High Sensitivity): 6 ng/L (ref <18)
Troponin I (High Sensitivity): 6 ng/L (ref <18)

## 2023-01-28 MED ORDER — POTASSIUM CHLORIDE CRYS ER 20 MEQ PO TBCR: 20.0000 meq | EXTENDED_RELEASE_TABLET | Freq: Every day | ORAL | 0 refills | Status: DC

## 2023-01-28 MED ORDER — FAMOTIDINE 20 MG PO TABS
20.0000 mg | ORAL_TABLET | Freq: Once | ORAL | Status: AC
  Administered 2023-01-28: 20 mg via ORAL
  Filled 2023-01-28: qty 1

## 2023-01-28 MED ORDER — POTASSIUM CHLORIDE CRYS ER 20 MEQ PO TBCR
40.0000 meq | EXTENDED_RELEASE_TABLET | Freq: Once | ORAL | Status: AC
  Administered 2023-01-28: 40 meq via ORAL
  Filled 2023-01-28: qty 2

## 2023-01-28 MED ORDER — IPRATROPIUM-ALBUTEROL 0.5-2.5 (3) MG/3ML IN SOLN
3.0000 mL | Freq: Once | RESPIRATORY_TRACT | Status: AC
  Administered 2023-01-28: 3 mL via RESPIRATORY_TRACT
  Filled 2023-01-28: qty 3

## 2023-01-28 MED ORDER — ACETAMINOPHEN 325 MG PO TABS
650.0000 mg | ORAL_TABLET | Freq: Once | ORAL | Status: DC
  Filled 2023-01-28: qty 2

## 2023-01-28 MED ORDER — ALUM & MAG HYDROXIDE-SIMETH 200-200-20 MG/5ML PO SUSP
30.0000 mL | Freq: Once | ORAL | Status: AC
  Administered 2023-01-28: 30 mL via ORAL
  Filled 2023-01-28: qty 30

## 2023-01-28 MED ORDER — LIDOCAINE VISCOUS HCL 2 % MT SOLN
15.0000 mL | Freq: Once | OROMUCOSAL | Status: AC
  Administered 2023-01-28: 15 mL via ORAL
  Filled 2023-01-28: qty 15

## 2023-01-28 NOTE — Discharge Instructions (Addendum)
Note the workup today was overall reassuring.  As discussed, I suspect your symptoms are likely secondary to left-sided chest wall pain given reproducibility on exam.  Recommend continued use of Tylenol/Motrin for pain.  Recommend follow-up with primary care for reassessment of your symptoms.  Please do not hesitate to return to emergency department for worrisome signs and symptoms we discussed become apparent.

## 2023-01-28 NOTE — ED Triage Notes (Signed)
Pt c/o Left sided nonradiating chest pain started yesterday; denies sob

## 2023-01-28 NOTE — ED Provider Notes (Signed)
[place] EMERGENCY DEPARTMENT AT [facility] Provider Note   CSN: [id number] Arrival date & time: 01/28/23  6213     History  Chief Complaint  Patient presents with   Chest Pain    CRU BORDAS is a 67 y.o. male.   Chest Pain   67 year old male presents emergency department with complaints of chest pain.  Reports left-sided chest pain without radiation beginning yesterday afternoon.  States that he consumed 2 beers prior to falling asleep.  States that when he woke up from the nap, had some left-sided chest pain which has slightly worsened since onset.  Denies any fever, cough, shortness of breath, abdominal pain, nausea, vomiting, fever, chills.  States that he feels like the alcohol worsened his symptoms.  States that since then, denies any known exacerbating relieving factors.  Denies any trauma to chest.  States has been compliant with his at home medications.  Presents emergency department for further assessment.  Denies any illicit substance use  Past medical history significant for PAD with intermittent claudication, atrial fibrillation on Eliquis, hyperlipidemia, hypertension, GERD, CHF, alcohol abuse  Home Medications Prior to Admission medications   Medication Sig Start Date End Date Taking? Authorizing Provider  potassium chloride SA (KLOR-CON M) 20 MEQ tablet Take 1 tablet (20 mEq total) by mouth daily. 01/28/23  Yes Sherian Maroon A, PA  albuterol (VENTOLIN HFA) 108 (90 Base) MCG/ACT inhaler Inhale 2 puffs into the lungs every 6 (six) hours as needed for wheezing or shortness of breath. 01/13/22   Leslye Peer, MD  apixaban (ELIQUIS) 5 MG TABS tablet Take 1 tablet (5 mg total) by mouth 2 (two) times daily. 12/12/21 07/28/22  Champ Mungo, DO  aspirin EC 81 MG tablet TAKE ONE TABLET BY MOUTH DAILY FOR HEART 01/28/22   [provider]  diclofenac Sodium (VOLTAREN) 1 % GEL as needed. 12/23/19   [provider]  diltiazem (CARDIZEM CD) 180 MG 24 hr  capsule Take 1 capsule (180 mg total) by mouth daily. 12/13/21   Inez Catalina, MD  empagliflozin (JARDIANCE) 25 MG TABS tablet Take 1 tablet by mouth daily. 01/21/22   [provider]  ferrous sulfate 325 (65 FE) MG tablet Take 1 tablet (325 mg total) by mouth daily with breakfast. 12/13/21   Champ Mungo, DO  fluticasone (FLONASE) 50 MCG/ACT nasal spray INSTILL 2 SPRAYS IN EACH NOSTRIL DAILY FOR CHRONIC SINUSITIS 07/15/22   [provider]  Multiple Vitamins-Minerals (MULTIVITAMIN MEN 50+ PO) Take by mouth daily.    [provider]  nicotine (NICODERM CQ - DOSED IN MG/24 HOURS) 14 mg/24hr patch APPLY 1 PATCH TO SKIN ONCE A DAY *APPLY TO NON-HAIRY, CLEAN, DRY AREA (NO TOBACCO PRODUCTS)* - USE FOR 6 WEEKS Patient not taking: Reported on 07/07/2022 02/18/22   [provider]  omeprazole (PRILOSEC OTC) 20 MG tablet Take 20 mg by mouth daily.    [provider]  rosuvastatin (CRESTOR) 20 MG tablet Take 1 tablet (20 mg total) by mouth at bedtime. 03/26/22 07/07/22  Tolia, Sunit, DO  sacubitril-valsartan (ENTRESTO) 24-26 MG Take 1 tablet by mouth 2 (two) times daily. Patient not taking: Reported on 07/07/2022 01/21/22   [provider]  tamsulosin (FLOMAX) 0.4 MG CAPS capsule Take 1 capsule by mouth daily. 01/28/22   [provider]  Tiotropium Bromide-Olodaterol (STIOLTO RESPIMAT) 2.5-2.5 MCG/ACT AERS Inhale 2 puffs into the lungs daily. 12/17/21   Noemi Chapel, NP      Allergies  Patient has no known allergies.    Review of Systems   Review of Systems  Cardiovascular:  Positive for chest pain.    Physical Exam Updated Vital Signs BP 118/81   Pulse 94   Temp 98 F (36.7 C)   Resp (!) 31   SpO2 90%  Physical Exam Vitals and nursing note reviewed.  Constitutional:      General: He is not in acute distress.    Appearance: He is well-developed.  HENT:     Head: Normocephalic and atraumatic.  Eyes:     Conjunctiva/sclera:  Conjunctivae normal.  Cardiovascular:     Rate and Rhythm: Normal rate and regular rhythm.     Pulses: Normal pulses.     Heart sounds: No murmur heard. Pulmonary:     Effort: Pulmonary effort is normal. No respiratory distress.     Breath sounds: Normal breath sounds. No wheezing, rhonchi or rales.     Comments: Mild left-sided chest wall tenderness to palpation.  Pain elicited with resisted horizontal adduction of left shoulder Abdominal:     Palpations: Abdomen is soft.     Tenderness: There is no abdominal tenderness.  Musculoskeletal:        General: No swelling.     Cervical back: Neck supple.     Right lower leg: No edema.     Left lower leg: No edema.  Skin:    General: Skin is warm and dry.     Capillary Refill: Capillary refill takes less than 2 seconds.  Neurological:     Mental Status: He is alert.  Psychiatric:        Mood and Affect: Mood normal.     ED Results / Procedures / Treatments   Labs (all labs ordered are listed, but only abnormal results are displayed) Labs Reviewed  BASIC METABOLIC PANEL - Abnormal; Notable for the following components:      Result Value   Potassium 3.3 (*)    CO2 17 (*)    Glucose, Bld 100 (*)    All other components within normal limits  CBC - Abnormal; Notable for the following components:   RBC 6.09 (*)    Hemoglobin 17.7 (*)    All other components within normal limits  MAGNESIUM  TROPONIN I (HIGH SENSITIVITY)  TROPONIN I (HIGH SENSITIVITY)    EKG None  Radiology DG Chest 2 View  Result Date: 01/28/2023 CLINICAL DATA:  cp EXAM: CHEST - 2 VIEW COMPARISON:  12/11/2021. FINDINGS: Bilateral lungs appear hyperexpanded and hyperlucent with coarse bronchovascular markings, in keeping with COPD. Bilateral lungs otherwise appear clear. No dense consolidation. Bilateral costophrenic angles are clear. Normal cardio-mediastinal silhouette. No acute osseous abnormalities. The soft tissues are within normal limits. IMPRESSION: 1.  COPD. 2. No active cardiopulmonary disease. Electronically Signed   By: Jules Schick M.D.   On: 01/28/2023 07:51    Procedures Procedures    Medications Ordered in ED Medications  acetaminophen (TYLENOL) tablet 650 mg (650 mg Oral Not Given 01/28/23 1032)  potassium chloride SA (KLOR-CON M) CR tablet 40 mEq (40 mEq Oral Given 01/28/23 0756)  famotidine (PEPCID) tablet 20 mg (20 mg Oral Given 01/28/23 0809)  alum & mag hydroxide-simeth (MAALOX/MYLANTA) 200-200-20 MG/5ML suspension 30 mL (30 mLs Oral Given 01/28/23 0809)    And  lidocaine (XYLOCAINE) 2 % viscous mouth solution 15 mL (15 mLs Oral Given 01/28/23 0809)  ipratropium-albuterol (DUONEB) 0.5-2.5 (3) MG/3ML nebulizer solution 3 mL (3 mLs Nebulization Given 01/28/23 0810)  ED Course/ Medical Decision Making/ A&P Clinical Course as of 01/28/23 1058  Thu Jan 28, 2023  1057 Resp(!): 31 Patient with documented hypoxia with oxygen saturation of 90% as well as tachypnea with respiratory rates in the 30s upon discharge.  Requesting nurse to change patient's vital signs as patient had oxygen saturation of 97% as well as respiratory rate of 17 just after abnormal vital signs were documented at 1038 [CR]    Clinical Course User Index [CR] Peter Garter, PA                             Medical Decision Making Amount and/or Complexity of Data Reviewed Labs: ordered. Radiology: ordered.  Risk OTC drugs. Prescription drug management.   This patient presents to the ED for concern of chest pain, this involves an extensive number of treatment options, and is a complaint that carries with it a high risk of complications and morbidity.  The differential diagnosis includes ACS, PE, pneumothorax, aortic dissection, peritonitis/myocarditis/tamponade, musculoskeletal, GERD, aortic aneurysm, CHF, COPD/asthma   Co morbidities that complicate the patient evaluation  See HPI   Additional history obtained:  Additional history obtained from  EMR External records from outside source obtained and reviewed including hospital records   Lab Tests:  I Ordered, and personally interpreted labs.  The pertinent results include: No leukocytosis.  Polycythemia with hemoglobin of 77.  Platelets within normal range.  Mild hypokalemia and slight decrease in bicarb at 3.3 and 17 respectively.  No other Electra abnormalities.  No renal dysfunction.  Troponin of 6 with repeat 6   Imaging Studies ordered:  I ordered imaging studies including chest x-ray` I independently visualized and interpreted imaging which showed no acute cardiopulmonary abnormalities I agree with the radiologist interpretation   Cardiac Monitoring: / EKG:  The patient was maintained on a cardiac monitor.  I personally viewed and interpreted the cardiac monitored which showed an underlying rhythm of: Normal sinus without obvious acute ischemic change from prior EKG performed.   Consultations Obtained:  N/a   Problem List / ED Course / Critical interventions / Medication management  Chest pain I ordered medication including DuoNeb, potassium chloride, Maalox, Pepcid   Reevaluation of the patient after these medicines showed that the patient improved I have reviewed the patients home medicines and have made adjustments as needed   Social Determinants of Health:  Chronic cigarette use.  Denies illicit drug use.   Test / Admission - Considered:  Chest pain Vitals signs within normal range and stable throughout visit.  Patient with documented hypoxia with oxygen saturation of 90% as well as tachypnea with respiratory rates in the 30s upon discharge.  Requesting nurse to change patient's vital signs as patient had oxygen saturation of 97% as well as respiratory rate of 17 just after abnormal vital signs were documented. Laboratory/imaging studies significant for: See above 67 year old male presents emergency department complaints of left-sided chest pain since  yesterday afternoon after he drank alcohol and took a nap.  Chest pain nonexertional without accompanying shortness of breath.  Noted some relief with GI cocktail administration while in the ED.  Patient without pulse deficits, neurologic deficits, concerning hypertension, chest pain with radiation to back so low suspicion for aortic dissection.  Patient with delta negative troponin, lack of acute ischemic changes on EKG so low suspicion for ACS.  Patient without complaints of shortness of breath, pleuritic chest pain, tachycardia, tachypnea, hypoxia, risk factors for  DVT/PE so low suspicion for PE.  Patient is with chest wall reproducible tenderness on exam as well as improvement with GI cocktail with symptoms beginning after consumption of alcohol.  Symptoms could be secondary GERD versus musculoskeletal versus combination.  Recommend continued reflux medication in the outpatient setting, avoidance of exacerbating factors as well as Tylenol for musculoskeletal pain.  Patient recommend follow-up with primary care for reassessment of symptoms.  Treatment plan discussed at length with patient and he acknowledged understanding was agreeable to said plan. Worrisome signs and symptoms were discussed with the patient, and the patient acknowledged understanding to return to the ED if noticed. Patient was stable upon discharge.          Final Clinical Impression(s) / ED Diagnoses Final diagnoses:  Chest wall pain    Rx / DC Orders ED Discharge Orders          Ordered    potassium chloride SA (KLOR-CON M) 20 MEQ tablet  Daily        01/28/23 1029              Peter Garter, [place] 01/28/23 1058    Maia Plan, MD 02/05/23 [phone]

## 2023-06-11 IMAGING — CT CT ANGIO CHEST
2 of 7 series · 18 of 46 positions shown · IV contrast (APPLIED)
Comparison: CT 02/04/2013.

CLINICAL DATA: Pulmonary embolism (PE) suspected, high prob

EXAM:
CT ANGIOGRAPHY CHEST WITH CONTRAST
TECHNIQUE: Multidetector CT imaging of the chest was performed using the
standard protocol during bolus administration of intravenous
contrast. Multiplanar CT image reconstructions and MIPs were
obtained to evaluate the vascular anatomy.

[Series 6: thins · axial · 0.80mm/px · z∈[+1116,+1418]mm · 15 of 486 slices shown]
[im 27/486  lung]
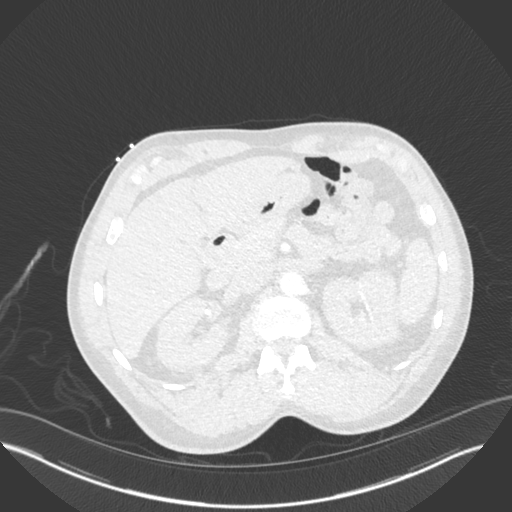
[im 54/486  soft-tissue]
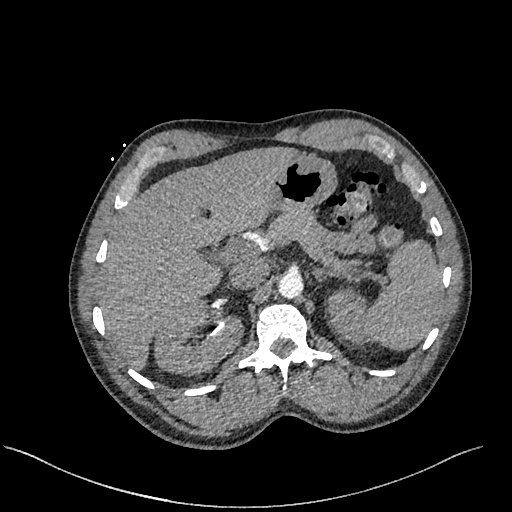
[im 81/486  lung]
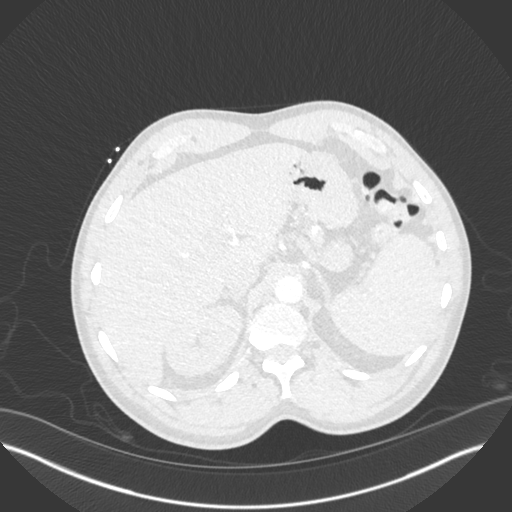
[im 108/486  soft-tissue]
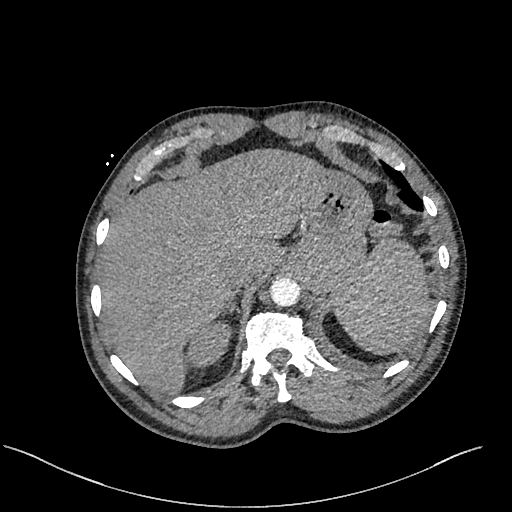
[im 162/486  lung]
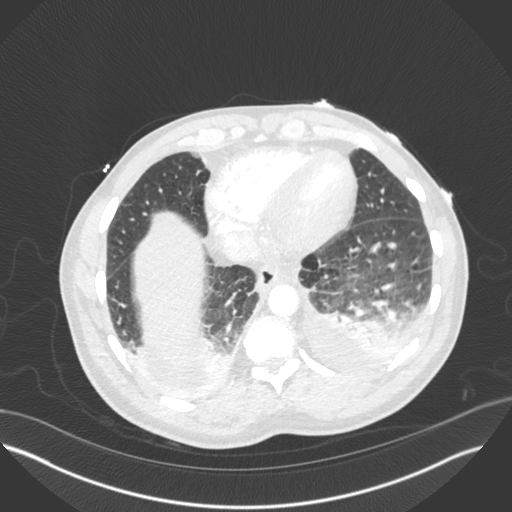
[im 189/486  soft-tissue]
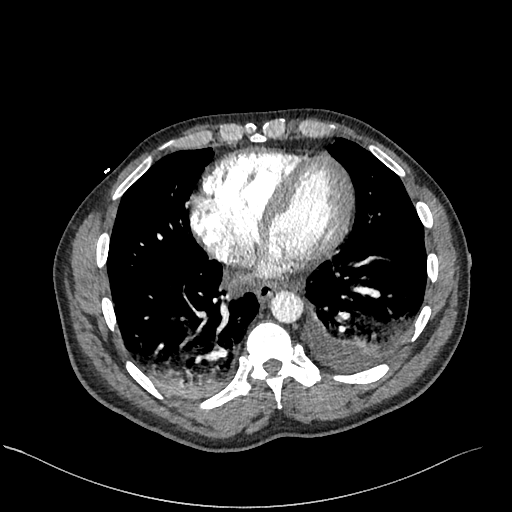
[im 216/486  lung]
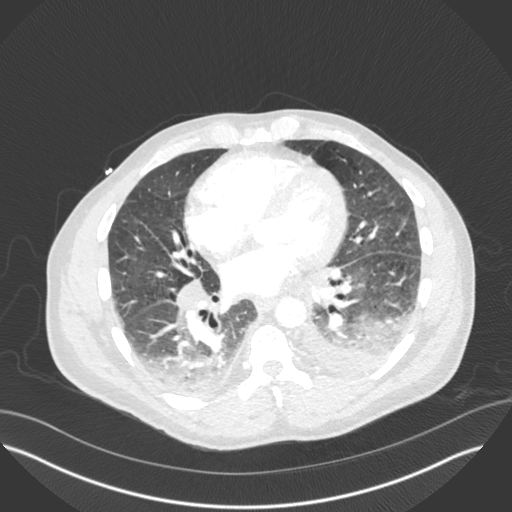
[im 243/486  soft-tissue]
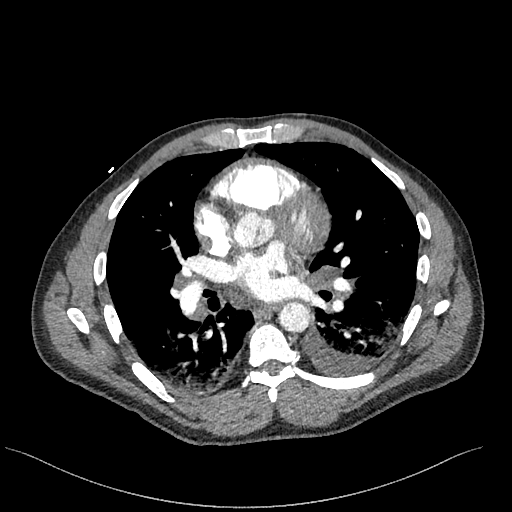
[im 270/486  lung]
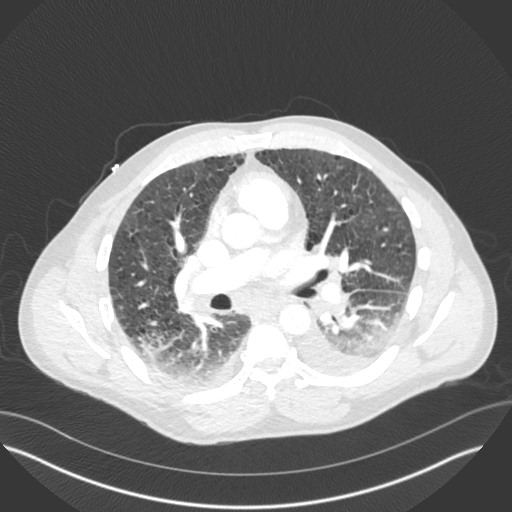
[im 297/486  soft-tissue]
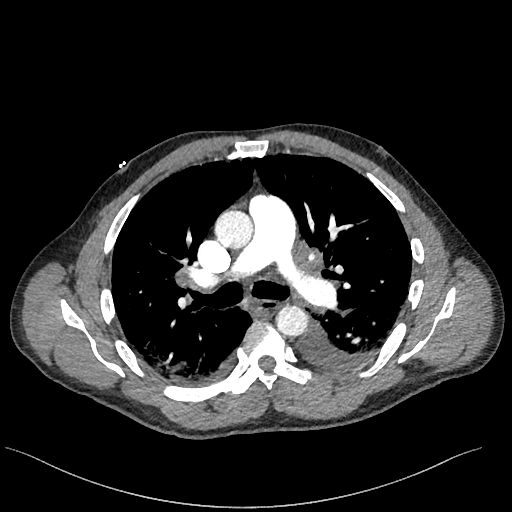
[im 324/486  lung]
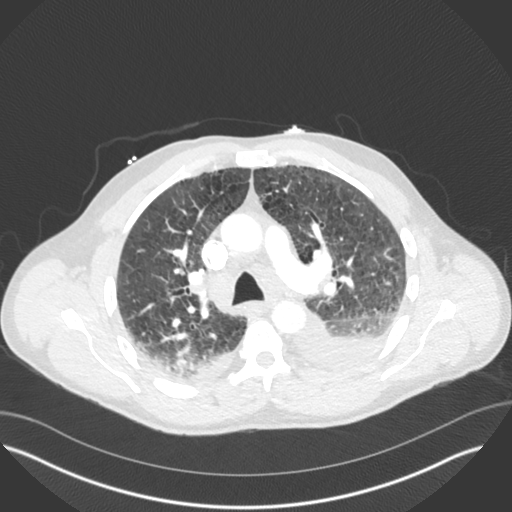
[im 378/486  soft-tissue]
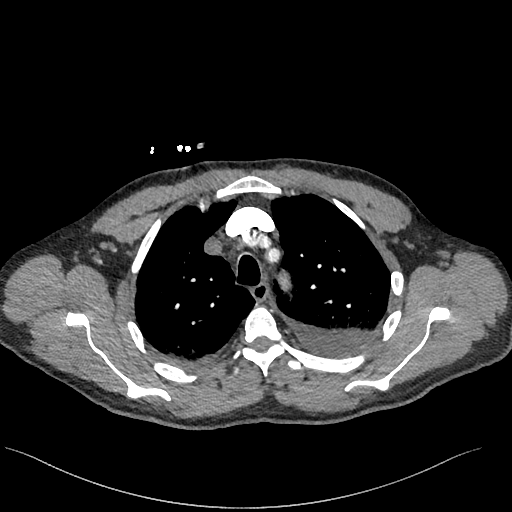
[im 405/486  lung]
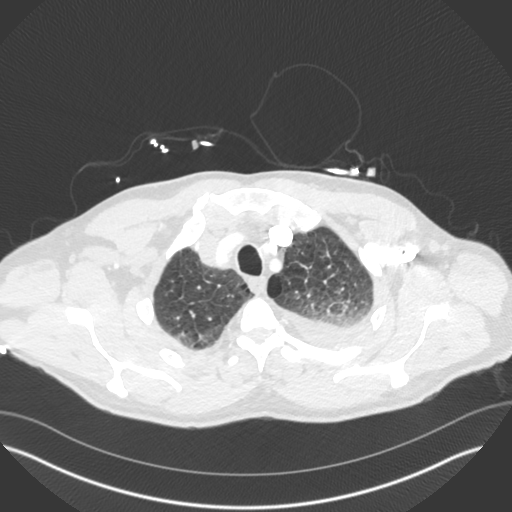
[im 432/486  soft-tissue]
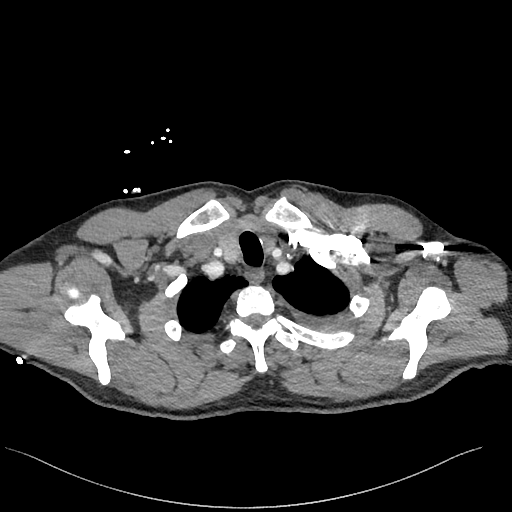
[im 459/486  lung]
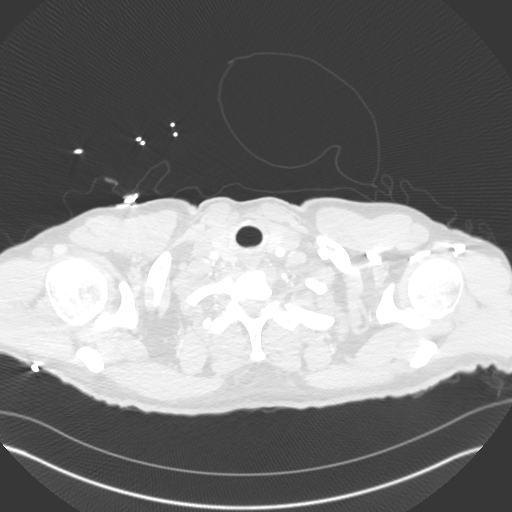

[Series 8: cor · coronal · 0.70mm/px · 3 of 139 slices shown]
[im 35/139  soft-tissue]
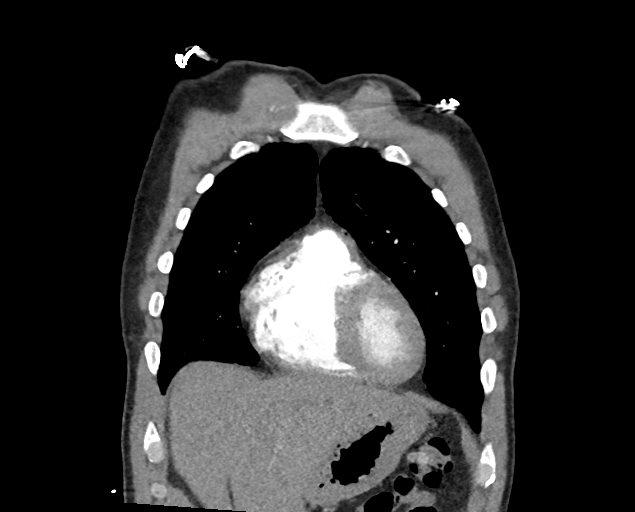
[im 70/139  soft-tissue]
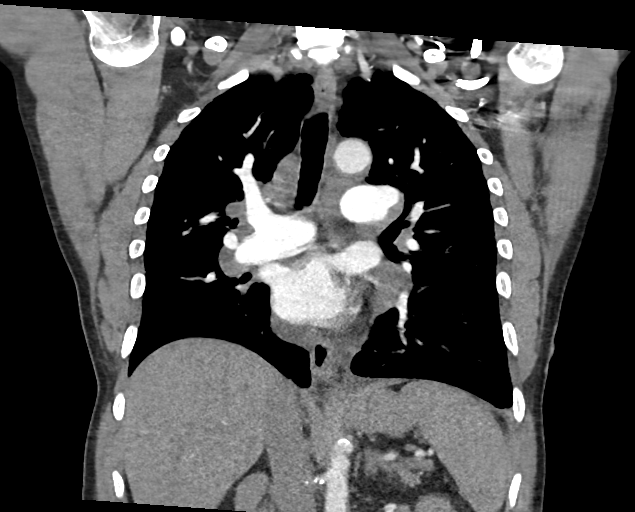
[im 104/139  soft-tissue]
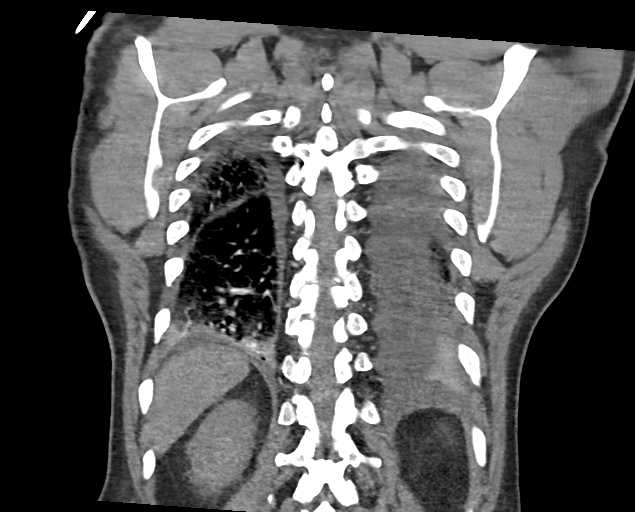

[18 of 46 positions shown; findings below may reference images not displayed]

RADIATION DOSE REDUCTION: This exam was performed according to the
departmental dose-optimization program which includes automated
exposure control, adjustment of the mA and/or kV according to
patient size and/or use of iterative reconstruction technique.

CONTRAST:  100mL OMNIPAQUE IOHEXOL 350 MG/ML SOLN
FINDINGS: Cardiovascular: Satisfactory opacification of the pulmonary arteries
to the segmental level. No evidence of pulmonary embolism. Normal
cardiac size.No pericardial disease. Mild atherosclerosis of the
thoracic aorta.

Mediastinum/Nodes: There is mediastinal and hilar lymphadenopathy.
For reference: (Tracheal lymph node measures 2.0 cm (series 5, image
55). Right hilar lymph node measures 1.5 cm (series 5, image 74).
The thyroid is unremarkable. Esophagus is unremarkable.

Lungs/Pleura: Mid to apical predominant centrilobular and paraseptal
emphysema. There is diffuse interlobular septal thickening and
ground-glass opacities. There are small bilateral pleural effusions,
left greater than right, with adjacent basilar consolidations. No
pneumothorax.

Upper Abdomen: No acute abnormality.

Musculoskeletal: No chest wall abnormality. No acute or significant
osseous findings.

Review of the MIP images confirms the above findings.
IMPRESSION: Moderate pulmonary edema with small, left greater than right pleural
effusions. Adjacent bibasilar consolidations, favored to be
atelectasis. No evidence of pulmonary embolism.

Mediastinal and bilateral hilar lymphadenopathy, possibly reactive.
Recommend follow-up chest CT after resolution of acute illness in
6-12 weeks.

## 2023-06-11 IMAGING — DX DG CHEST 1V PORT
1 series · 1 of 1 positions shown · non-contrast
Comparison: Chest x-ray dated October 30, 2021

CLINICAL DATA: Shortness of breath

EXAM:
PORTABLE CHEST 1 VIEW

[chest ap]
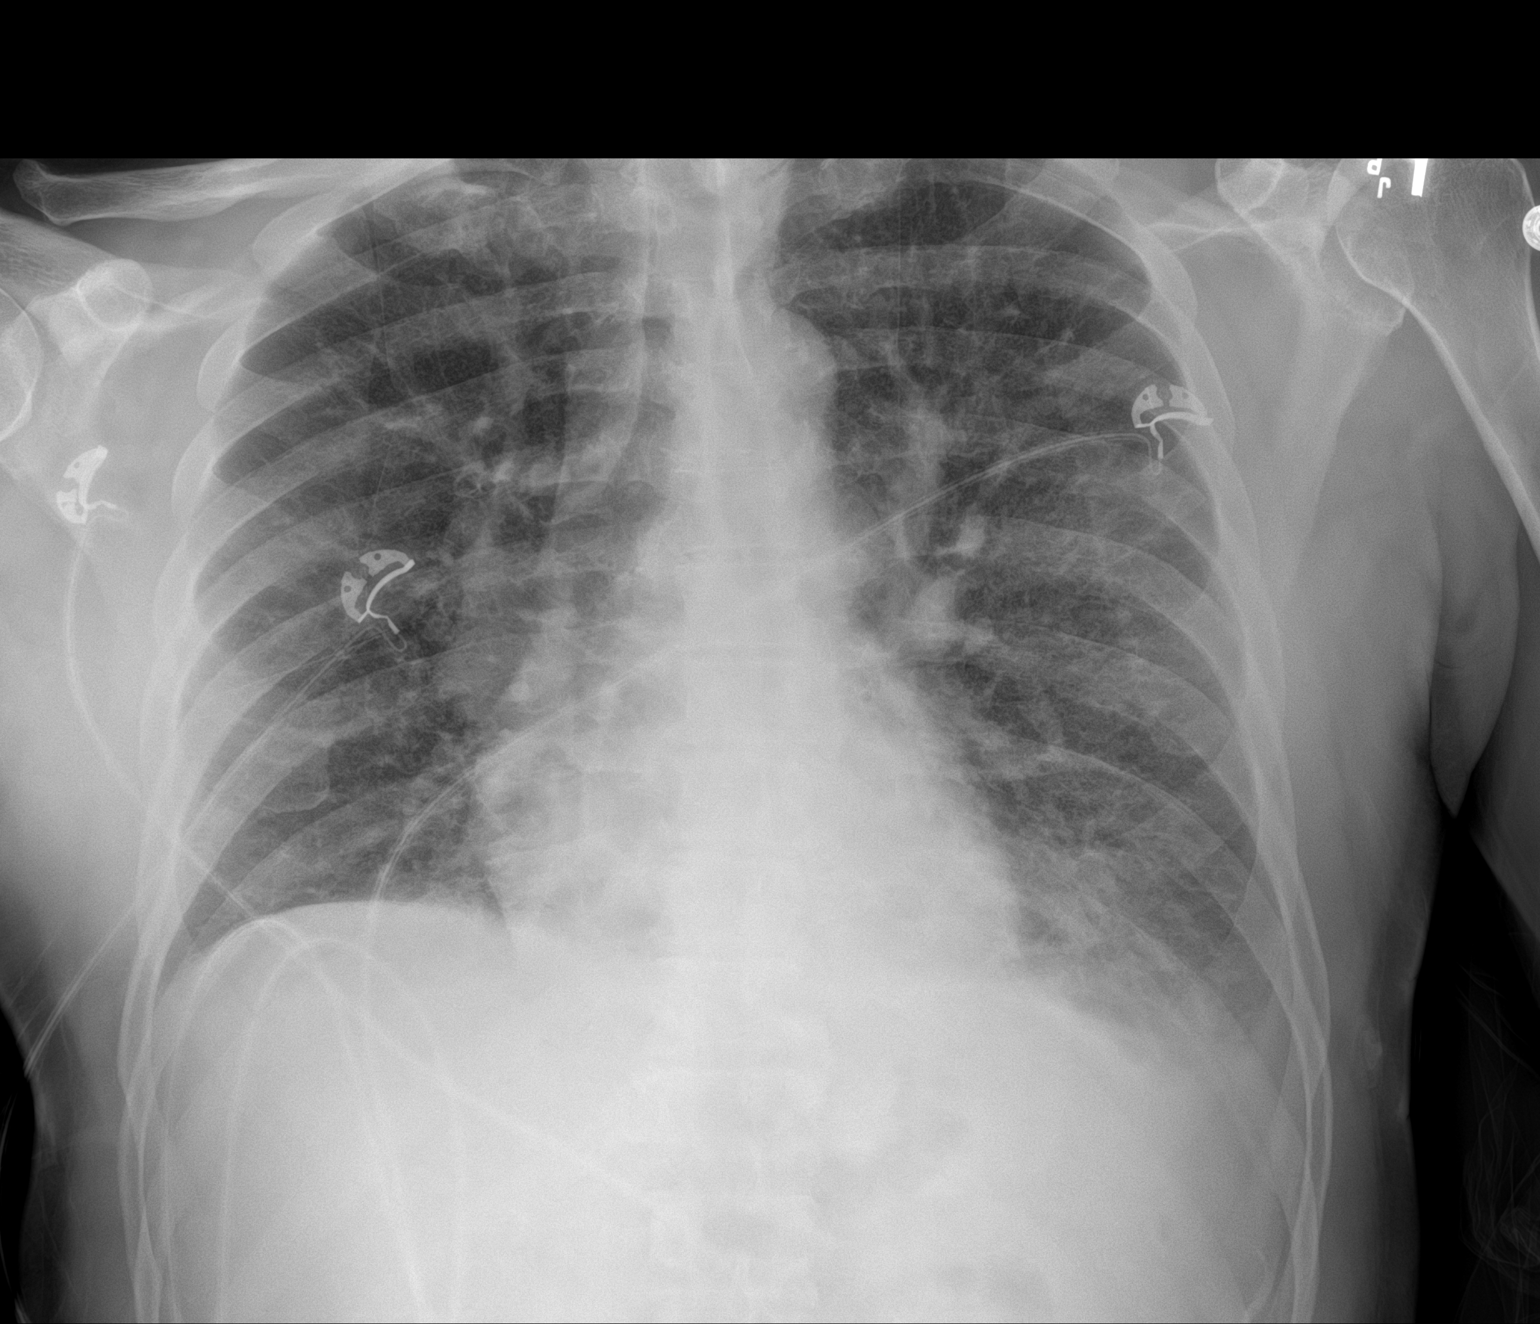

[1 of 1 positions shown; findings below may reference images not displayed]

FINDINGS: Cardiac and mediastinal contours within normal limits. New mild
left-greater-than-right airspace opacities. New small left pleural
effusion. No evidence of pneumothorax.
IMPRESSION: 1. New mild left-greater-than-right airspace opacities, possibly due
to pulmonary edema or infection.
2. New small left pleural effusion.

## 2023-06-13 IMAGING — US US THYROID
1 series · 13 of 23 positions shown · non-contrast
Comparison: Chest CT 12/04/2021

CLINICAL DATA: Thyromegaly.

EXAM:
THYROID ULTRASOUND
TECHNIQUE: Ultrasound examination of the thyroid gland and adjacent soft
tissues was performed.

[Series 1: us thyroid · 23 acquisitions, 13 frames shown]
[im 1/23]
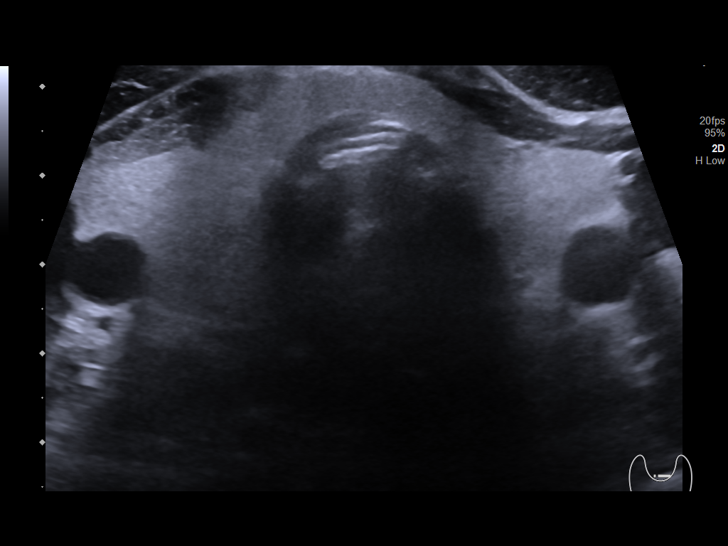
[im 3/23]
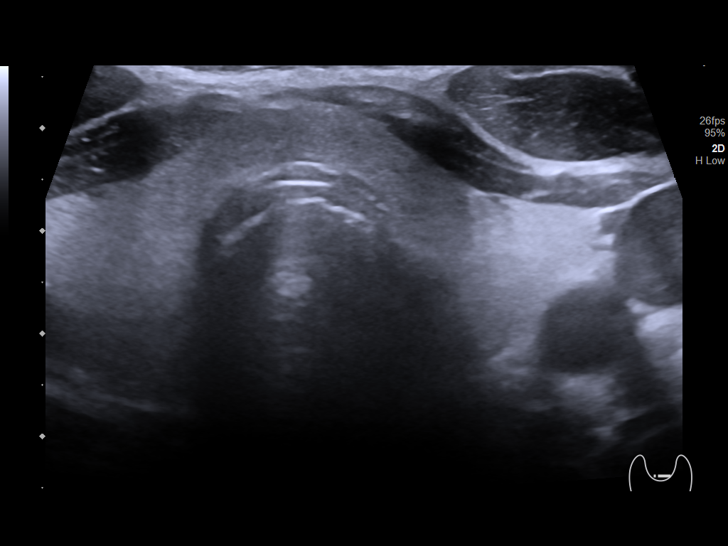
[im 5/23]
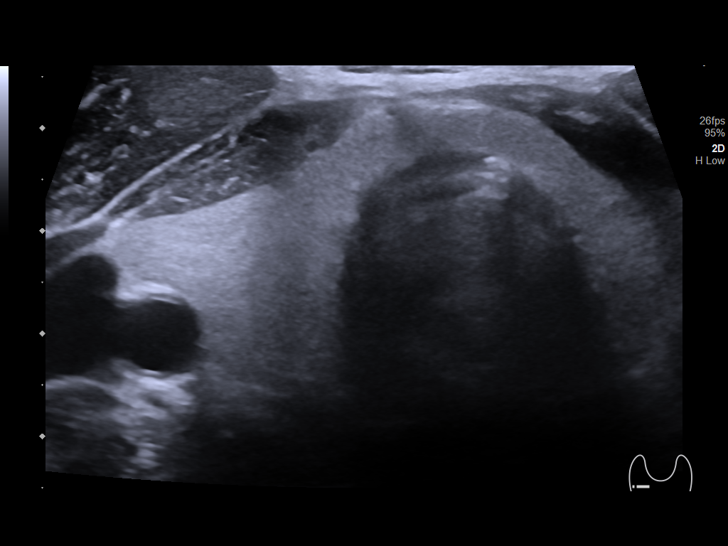
[im 7/23]
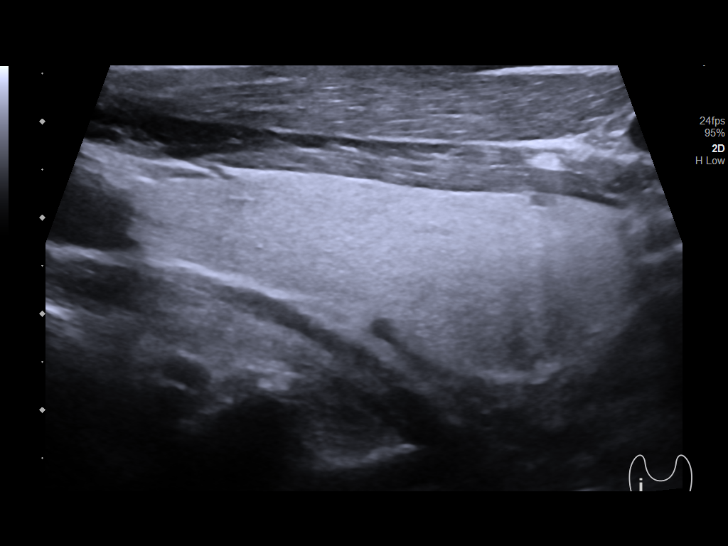
[im 8/23]
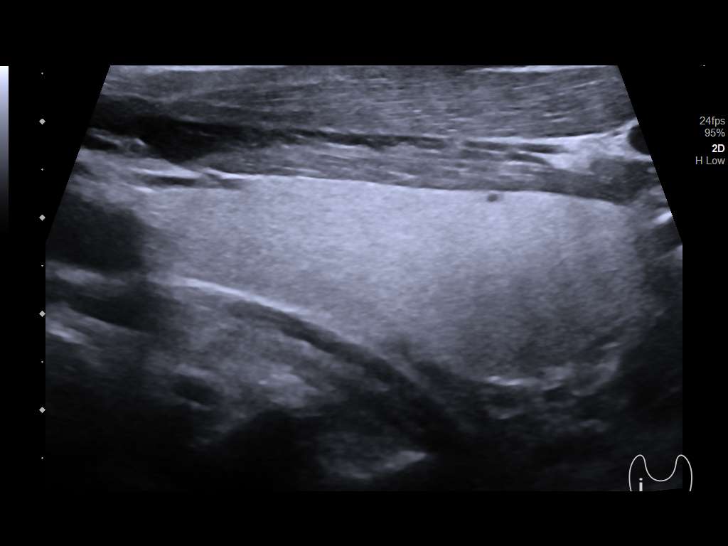
[im 10/23]
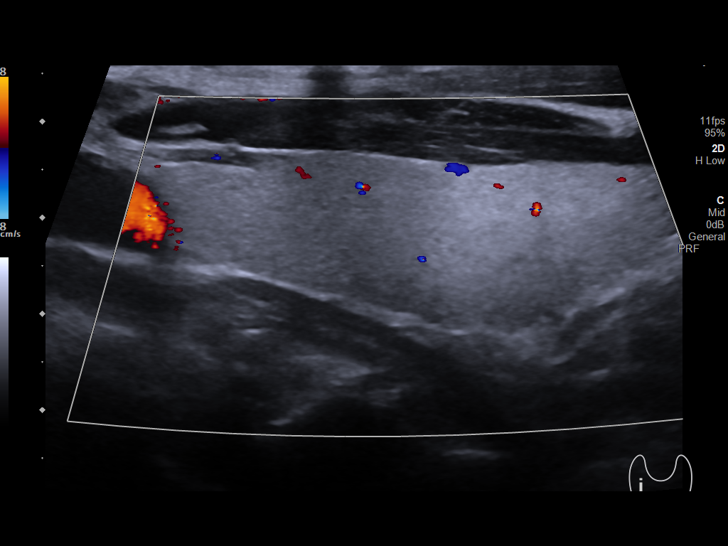
[im 12/23]
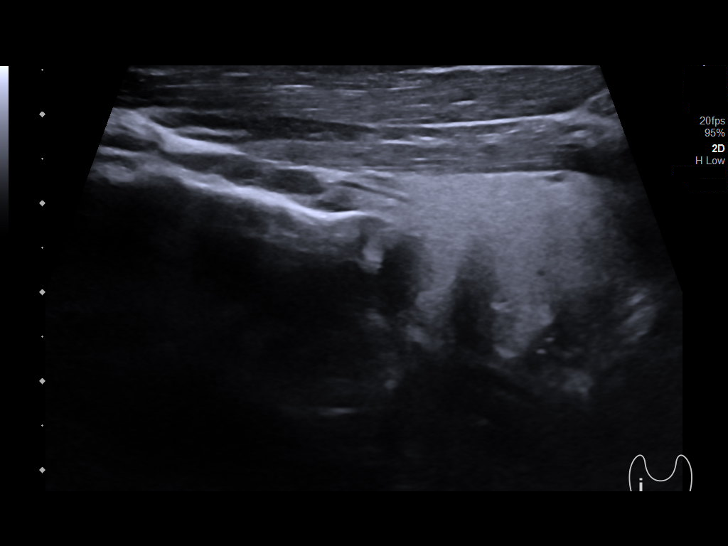
[im 14/23]
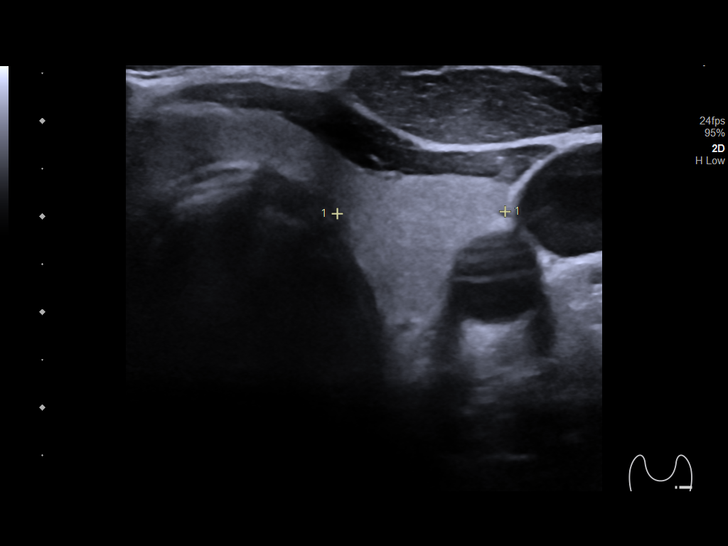
[im 16/23]
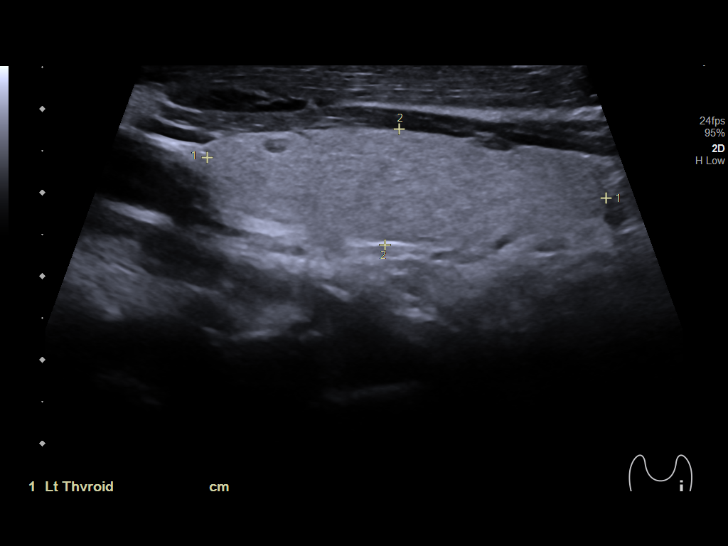
[im 17/23]
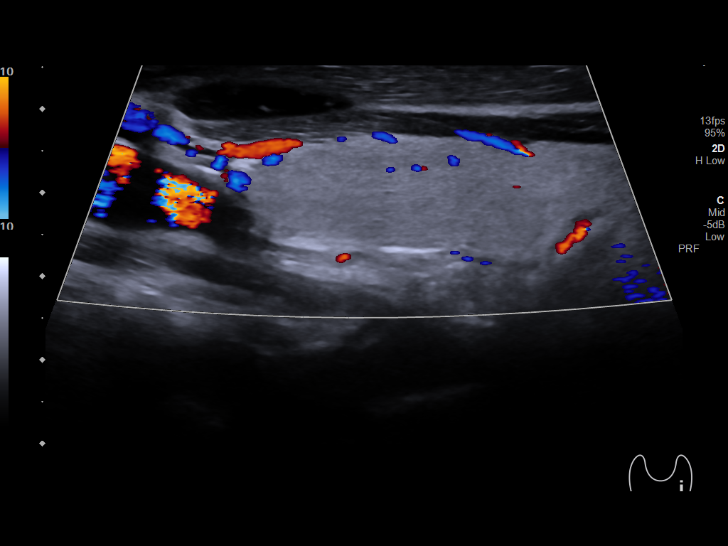
[im 19/23]
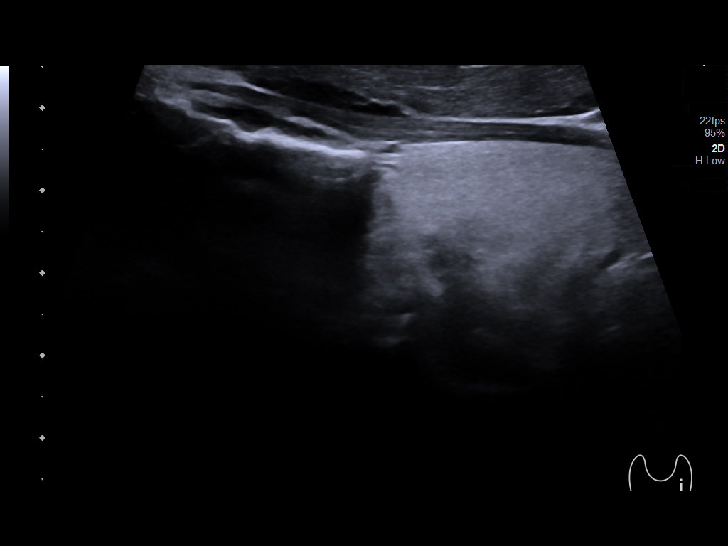
[im 21/23]
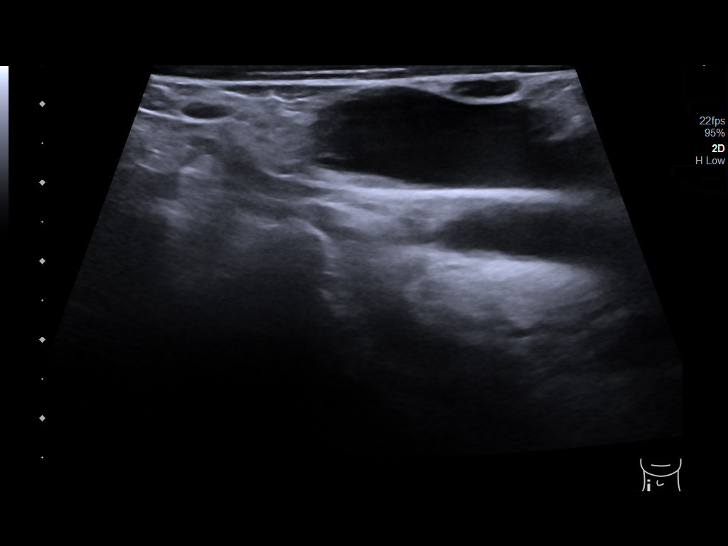
[im 23/23]
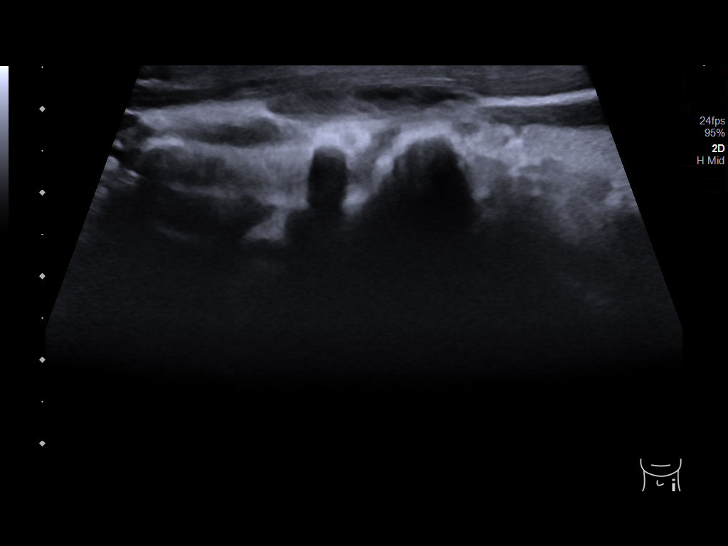

[13 of 23 positions shown; findings below may reference images not displayed]

FINDINGS: Parenchymal Echotexture: Normal

Isthmus: 0.5 cm

Right lobe: 5.3 x 2.0 x 2.2 cm

Left lobe: 4.8 x 1.4 x 1.8 cm

_________________________________________________________

Estimated total number of nodules >/= 1 cm: 0

Number of spongiform nodules >/=  2 cm not described below (TR1): 0

Number of mixed cystic and solid nodules >/= 1.5 cm not described
below (TR2): 0

_________________________________________________________

No discrete right thyroid nodules. Tiny hypoechoic nodules in the
left thyroid lobe, largest measures 0.3 cm. These tiny left thyroid
nodules do not meet criteria for biopsy or dedicated follow-up.

Prominent lymph node in the right supraclavicular region on sequence
6, image 163. Lymph node measures up to 0.9 cm in the short axis.
IMPRESSION: 1. Normal appearance of the thyroid tissue. Tiny hypoechoic nodules
or cysts in the left thyroid lobe do not meet criteria for biopsy or
dedicated follow-up.
2. Prominent lymph node in the right supraclavicular region. Patient
also has prominent mediastinal and hilar lymph nodes on recent chest
CT. This lymphadenopathy is indeterminate.

The above is in keeping with the ACR TI-RADS recommendations - [HOSPITAL] 0509;[DATE].

## 2023-06-14 IMAGING — DX DG CHEST 1V PORT
1 series · 1 of 1 positions shown · non-contrast
Comparison: December 04, 2021

CLINICAL DATA: Hypoxemia.  Shortness of breath.

EXAM:
PORTABLE CHEST 1 VIEW

[chest]
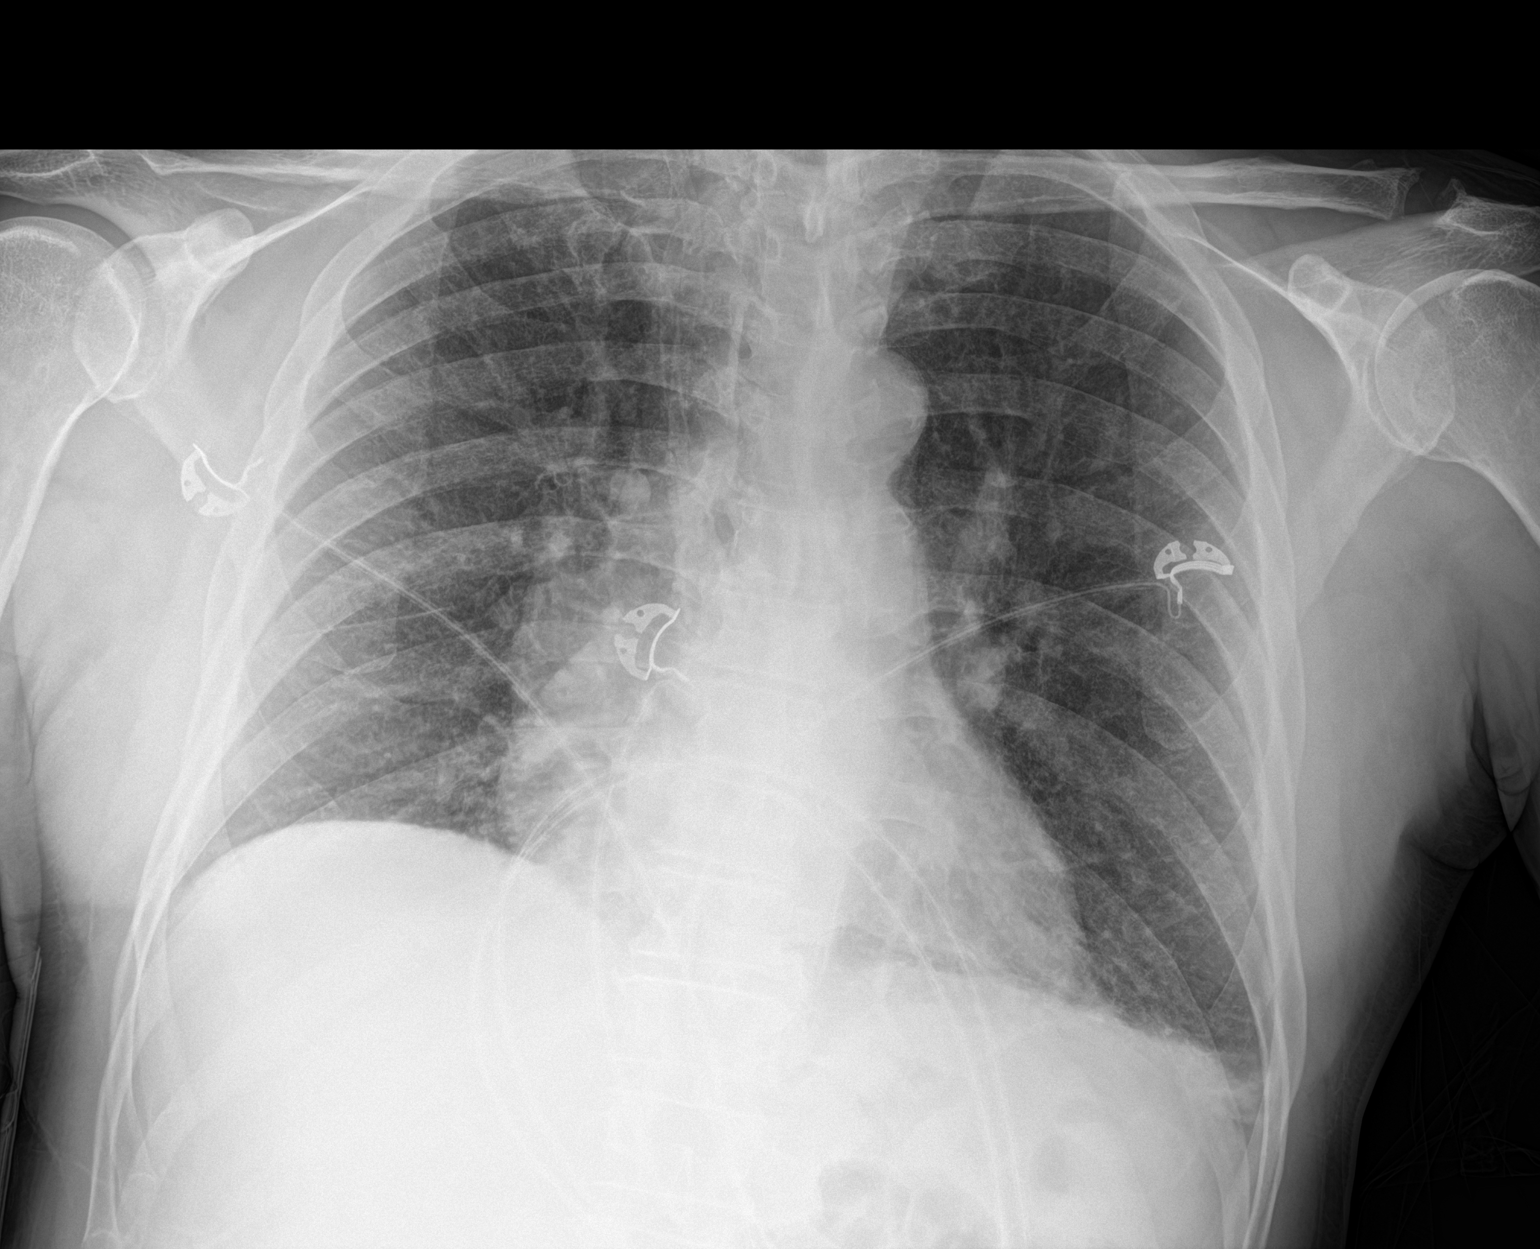

[1 of 1 positions shown; findings below may reference images not displayed]

FINDINGS: The patchy pulmonary opacities identified previously have almost
completely resolved. Minimal opacity in left base has the appearance
of atelectasis. Stable cardiomegaly. The hila and mediastinum are
stable. No overt edema. Mild pulmonary venous congestion not
excluded. No pneumothorax.
IMPRESSION: Mild opacity in left base is significantly improved, favored
represent atelectasis. No other acute abnormalities.

## 2023-07-08 ENCOUNTER — Telehealth: Payer: Self-pay | Admitting: Emergency Medicine

## 2023-07-08 NOTE — Telephone Encounter (Signed)
 FYI, called patient to schedule yearly follow up and patient denied.

## 2023-07-12 NOTE — Telephone Encounter (Signed)
 Thank you :)

## 2023-08-20 ENCOUNTER — Encounter (HOSPITAL_COMMUNITY): Payer: Self-pay

## 2023-08-23 ENCOUNTER — Other Ambulatory Visit (HOSPITAL_COMMUNITY): Payer: Self-pay | Admitting: Physician Assistant

## 2023-08-23 DIAGNOSIS — Z87891 Personal history of nicotine dependence: Secondary | ICD-10-CM

## 2023-08-23 DIAGNOSIS — Z122 Encounter for screening for malignant neoplasm of respiratory organs: Secondary | ICD-10-CM

## 2023-09-07 ENCOUNTER — Ambulatory Visit (HOSPITAL_COMMUNITY)
Admission: RE | Admit: 2023-09-07 | Discharge: 2023-09-07 | Disposition: A | Discharge status: Home / Self Care | Payer: No Typology Code available for payment source | Source: Ambulatory Visit | Attending: Physician Assistant | Admitting: Physician Assistant

## 2023-09-07 ENCOUNTER — Encounter (HOSPITAL_COMMUNITY): Payer: Self-pay

## 2023-09-07 ENCOUNTER — Other Ambulatory Visit (HOSPITAL_COMMUNITY): Payer: Self-pay | Admitting: Physician Assistant

## 2023-09-07 DIAGNOSIS — Z87891 Personal history of nicotine dependence: Secondary | ICD-10-CM | POA: Diagnosis present

## 2023-09-07 DIAGNOSIS — Z122 Encounter for screening for malignant neoplasm of respiratory organs: Secondary | ICD-10-CM | POA: Diagnosis present

## 2023-10-25 ENCOUNTER — Encounter: Payer: Self-pay | Admitting: Gastroenterology

## 2023-12-16 ENCOUNTER — Encounter: Payer: Self-pay | Admitting: Gastroenterology

## 2023-12-16 ENCOUNTER — Ambulatory Visit (INDEPENDENT_AMBULATORY_CARE_PROVIDER_SITE_OTHER): Admitting: Gastroenterology

## 2023-12-16 VITALS — BP 122/80 | HR 77 | Ht 69.0 in | Wt 172.0 lb

## 2023-12-16 DIAGNOSIS — I4891 Unspecified atrial fibrillation: Secondary | ICD-10-CM | POA: Diagnosis not present

## 2023-12-16 DIAGNOSIS — J439 Emphysema, unspecified: Secondary | ICD-10-CM | POA: Diagnosis not present

## 2023-12-16 DIAGNOSIS — I739 Peripheral vascular disease, unspecified: Secondary | ICD-10-CM | POA: Diagnosis not present

## 2023-12-16 DIAGNOSIS — Z1211 Encounter for screening for malignant neoplasm of colon: Secondary | ICD-10-CM

## 2023-12-16 DIAGNOSIS — J432 Centrilobular emphysema: Secondary | ICD-10-CM

## 2023-12-16 DIAGNOSIS — Z9981 Dependence on supplemental oxygen: Secondary | ICD-10-CM | POA: Diagnosis not present

## 2023-12-16 DIAGNOSIS — Z87891 Personal history of nicotine dependence: Secondary | ICD-10-CM

## 2023-12-16 NOTE — Progress Notes (Signed)
 Discussed the use of AI scribe software for clinical note transcription with the patient, who gave verbal consent to proceed.  HPI : Hector [name] is a 68 year old male who presents for a screening colonoscopy.  He is a poor medical historian and records are difficult to sort through in the electronic medical record  He reports he had a previous colonoscopy approximately ten years ago at [facility], performed by [facility], which was normal with no polyps removed. No family history of colon cancer is reported.  Bowel movements are regular, occurring daily, especially with milk consumption. No significant abdominal pain, diarrhea, or blood in the stool.  He has experienced slight unintentional weight loss, decreasing from 176 pounds to 172 pounds over the past month despite maintaining a good diet.   He has a history of a blood clot in his leg, which occurred years ago following a knee replacement surgery. He says is on Eliquis  for this condition. The blood clot occurred after a knee replacement, and he does not recall being told of any other conditions that predispose him to clotting. He also has a stent in his leg for blood flow issues.  He uses oxygen at night and has been doing so for almost a year due to insufficient oxygen levels.  He denies a history of emphysema, although this is also listed as a diagnosis.  No significant shortness of breath during daily activities or wheezing or coughing is noted.  When asked if he has atrial fibrillation or any other heart issues, he says no.  He does not think he has ever seen a cardiologist. Review of the EMR indicates that he was admitted for acute diastolic heart failure and new onset A-fib in 2023.  It does not seem that he has seen a cardiologist since then. He denies any episodes of lightheadedness/dizziness, chest fluttering, chest pressure.  No history of cardioversion.     Past Medical History:  Diagnosis Date   Arthritis     Claudication in peripheral vascular disease (HCC) 09/05/2018   GERD (gastroesophageal reflux disease)    Gout    L knee   Hyperlipidemia    Hypertension    Hx DVT Atrial fibrillation Acute diastolic heart failure Centrilobular emphysema Chronic tobacco use    Past Surgical History:  Procedure Laterality Date   ABDOMINAL AORTOGRAM W/LOWER EXTREMITY N/A 09/06/2018   Procedure: ABDOMINAL AORTOGRAM W/LOWER EXTREMITY;  Surgeon: Cody Das, MD;  Location: [facility]VASIVE [facility];  Service: Cardiovascular;  Laterality: N/A;   FACIAL COSMETIC SURGERY     due ot shattered bones    PERIPHERAL VASCULAR BALLOON ANGIOPLASTY  09/06/2018   Procedure: PERIPHERAL VASCULAR BALLOON ANGIOPLASTY;  Surgeon: Cody Das, MD;  Location: [facility] INVASIVE CV LAB;  Service: Cardiovascular;;  LT SFA   right hand fracture     TOTAL KNEE ARTHROPLASTY Left 04/04/2013   Procedure: LEFT TOTAL KNEE ARTHROPLASTY;  Surgeon: Florencia Hunter, MD;  Location: [facility];  Service: Orthopedics;  Laterality: Left;    Family History  Problem Relation Age of Onset   Cancer Mother    Social History   Tobacco Use   Smoking status: Every Day    Current packs/day: 1.00    Average packs/day: 1 pack/day for 39.0 years (39.0 ttl pk-yrs)    Types: Cigarettes    Passive exposure: Never   Smokeless tobacco: Never   Tobacco comments:    Smokes 1 pack per day 06/16/22 [facility]   Vaping Use  Vaping status: Never Used  Substance Use Topics   Alcohol use: Yes    Comment: occ beer   Drug use: No   Current Outpatient Medications  Medication Sig Dispense Refill   apixaban  (ELIQUIS ) 5 MG TABS tablet Take 1 tablet (5 mg total) by mouth 2 (two) times daily. 60 tablet 2   aspirin  EC 81 MG tablet TAKE ONE TABLET BY MOUTH DAILY FOR HEART     diltiazem  (CARDIZEM  CD) 180 MG 24 hr capsule Take 1 capsule (180 mg total) by mouth daily. 30 capsule 2   empagliflozin (JARDIANCE) 25 MG TABS tablet Take 1 tablet by mouth daily.     ferrous  sulfate 325 (65 FE) MG tablet Take 1 tablet (325 mg total) by mouth daily with breakfast. 30 tablet 2   Multiple Vitamins-Minerals (MULTIVITAMIN MEN 50+ PO) Take by mouth daily.     omeprazole (PRILOSEC OTC) 20 MG tablet Take 20 mg by mouth daily.     rosuvastatin  (CRESTOR ) 20 MG tablet Take 1 tablet (20 mg total) by mouth at bedtime. 90 tablet 0   sacubitril -valsartan  (ENTRESTO ) 24-26 MG Take 1 tablet by mouth 2 (two) times daily.     tamsulosin (FLOMAX) 0.4 MG CAPS capsule Take 1 capsule by mouth daily.     albuterol  (VENTOLIN  HFA) 108 (90 Base) MCG/ACT inhaler Inhale 2 puffs into the lungs every 6 (six) hours as needed for wheezing or shortness of breath. (Patient not taking: Reported on 12/16/2023) 8 g 6   diclofenac  Sodium (VOLTAREN ) 1 % GEL as needed. (Patient not taking: Reported on 12/16/2023)     fluticasone (FLONASE) 50 MCG/ACT nasal spray INSTILL 2 SPRAYS IN EACH NOSTRIL DAILY FOR CHRONIC SINUSITIS (Patient not taking: Reported on 12/16/2023)     nicotine  (NICODERM CQ  - DOSED IN MG/24 HOURS) 14 mg/24hr patch APPLY 1 PATCH TO SKIN ONCE A DAY *APPLY TO NON-HAIRY, CLEAN, DRY AREA (NO TOBACCO PRODUCTS)* - USE FOR 6 WEEKS (Patient not taking: Reported on 12/16/2023)     potassium chloride  SA (KLOR-CON  M) 20 MEQ tablet Take 1 tablet (20 mEq total) by mouth daily. (Patient not taking: Reported on 12/16/2023) 3 tablet 0   Tiotropium Bromide-Olodaterol (STIOLTO RESPIMAT ) 2.5-2.5 MCG/ACT AERS Inhale 2 puffs into the lungs daily. (Patient not taking: Reported on 12/16/2023) 4 g 0   No current facility-administered medications for this visit.   No Known Allergies   Review of Systems: All systems reviewed and negative except where noted in HPI.    No results found.  Physical Exam: BP 122/80   Pulse 77   Ht 5' 9 (1.753 m)   Wt 172 lb (78 kg)   BMI 25.40 kg/m  Constitutional: Pleasant,well-developed, African American male in no acute distress. HEENT: Normocephalic and atraumatic. Conjunctivae  are normal. No scleral icterus. Neck supple.  Cardiovascular: Normal rate, regular rhythm.  Pulmonary/chest: Effort normal and breath sounds normal. No wheezing, rales or rhonchi. Abdominal: Soft, nondistended, nontender. Bowel sounds active throughout. There are no masses palpable. No hepatomegaly. Extremities: no edema Neurological: Alert and oriented to person place and time. Skin: Skin is warm and dry. No rashes noted. Psychiatric: Normal mood and affect. Behavior is normal.  CBC    Component Value Date/Time   WBC 8.3 01/28/2023 0653   RBC 6.09 (H) 01/28/2023 0653   HGB 17.7 (H) 01/28/2023 0653   HGB 16.1 03/02/2022 0925   HCT 52.0 01/28/2023 0653   HCT 51.4 (H) 03/02/2022 0925   PLT 288 01/28/2023 0981  PLT 303 08/29/2018 0957   MCV 85.4 01/28/2023 0653   MCV 83 08/29/2018 0957   MCH 29.1 01/28/2023 0653   MCHC 34.0 01/28/2023 0653   RDW 13.1 01/28/2023 0653   RDW 12.6 08/29/2018 0957   LYMPHSABS 1.3 12/04/2021 0810   LYMPHSABS 2.1 08/29/2018 0957   MONOABS 0.3 12/04/2021 0810   EOSABS 0.3 12/04/2021 0810   EOSABS 0.4 08/29/2018 0957   BASOSABS 0.1 12/04/2021 0810   BASOSABS 0.1 08/29/2018 0957    CMP     Component Value Date/Time   NA 136 01/28/2023 0653   NA 142 03/20/2022 1120   K 3.3 (L) 01/28/2023 0653   CL 106 01/28/2023 0653   CO2 17 (L) 01/28/2023 0653   GLUCOSE 100 (H) 01/28/2023 0653   BUN 11 01/28/2023 0653   BUN 11 03/20/2022 1120   CREATININE 0.96 01/28/2023 0653   CREATININE 0.83 03/17/2014 0926   CALCIUM  9.1 01/28/2023 0653   PROT 6.0 (L) 12/05/2021 0543   ALBUMIN  3.1 (L) 12/05/2021 0543   AST 24 12/05/2021 0543   ALT 17 12/05/2021 0543   ALKPHOS 60 12/05/2021 0543   BILITOT 0.7 12/05/2021 0543   GFRNONAA >60 01/28/2023 0653   GFRAA 88 01/09/2020 0933       Latest Ref Rng & Units 01/28/2023    6:53 AM 03/02/2022    9:25 AM 12/15/2021    3:13 AM  CBC EXTENDED  WBC 4.0 - 10.5 K/uL 8.3   19.3   RBC 4.22 - 5.81 MIL/uL 6.09   5.22    Hemoglobin 13.0 - 17.0 g/dL 16.1  09.6  04.5   HCT 39.0 - 52.0 % 52.0  51.4  37.5   Platelets 150 - 400 K/uL 288   517       ASSESSMENT AND PLAN:  68 year old male with history of atrial fibrillation, emphysema with oxygen dependence, chronic tobacco use, PAD due for average risk screening colonoscopy.  He denies any chronic GI symptoms.  He has lost a few pounds unintentionally over the past few months.   Colon cancer screening - Schedule colonoscopy in hospital setting, pending cardiology evaluation  Atrial fibrillation Atrial fibrillation documented with history of hospitalization in June 2023. Currently on apixaban  and diltiazem . No cardiology follow-up since 2023. - No cardiology follow-up since 2023. - Consult cardiology for clearance  and to hold apixaban  for 2 days prior to colonoscopy.  Emphysema with nocturnal oxygen/chronic tobacco use - Colonoscopy to be done in hospital setting - Patient denies taking any inhaled medications  Peripheral artery disease s/p angioplasty - On ASA  The details, risks (including bleeding, perforation, infection, missed lesions, medication reactions and possible hospitalization or surgery if complications occur), benefits, and alternatives to colonoscopy with possible biopsy and possible polypectomy were discussed with the patient and he consents to proceed.     Recording duration: 15 minutes     Arleen Bar E. Cherryl Corona, MD [facility]    Ulysees Gander, MD

## 2023-12-16 NOTE — Patient Instructions (Signed)
 We have sent a referral to Cardiology and they will contact you with an appointment.  We will call you to schedule a colonoscopy at the hospital after your follow up with Cardiology.  _______________________________________________________  If your blood pressure at your visit was 140/90 or greater, please contact your primary care physician to follow up on this.  _______________________________________________________  If you are age 66 or older, your body mass index should be between 23-30. Your Body mass index is 25.4 kg/m. If this is out of the aforementioned range listed, please consider follow up with your Primary Care Provider.  If you are age 91 or younger, your body mass index should be between 19-25. Your Body mass index is 25.4 kg/m. If this is out of the aformentioned range listed, please consider follow up with your Primary Care Provider.   ________________________________________________________  The [facility] providers would like to encourage you to use MYCHART to communicate with providers for non-urgent requests or questions.  Due to long hold times on the telephone, sending your provider a message by [facility] may be a faster and more efficient way to get a response.  Please allow 48 business hours for a response.  Please remember that this is for non-urgent requests.  _______________________________________________________   It was a pleasure to see you today!  Thank you for trusting me with your gastrointestinal care!    Scott E.Cherryl Corona, MD

## 2023-12-17 DIAGNOSIS — Z9981 Dependence on supplemental oxygen: Secondary | ICD-10-CM | POA: Insufficient documentation

## 2023-12-27 ENCOUNTER — Telehealth: Payer: Self-pay | Admitting: Gastroenterology

## 2023-12-27 ENCOUNTER — Other Ambulatory Visit: Payer: Self-pay

## 2023-12-27 DIAGNOSIS — I4891 Unspecified atrial fibrillation: Secondary | ICD-10-CM

## 2023-12-27 NOTE — Telephone Encounter (Signed)
 Inbound call from patient requesting a call to discuss scheduling colonoscopy at the hospital. Please advise, thank you.

## 2023-12-27 NOTE — Telephone Encounter (Signed)
 Discussed with pt that he was supposed to have cardiac clearance prior to scheduling colon. Referral entered in epic and pt knows he should get a call to set up the appt.

## 2024-01-26 NOTE — Telephone Encounter (Signed)
 Pt calling to check on procedure. Referral was placed in June for pt to be seen by cardiology. Pt states he never got a call to schedule. Called cards, pt is now scheduled to see Dr Michele tomorrow at 2:20pm, arrival time at 2pm. Pt aware of appt.

## 2024-01-26 NOTE — Telephone Encounter (Signed)
 Inbound call from patient requesting f/u call to discuss procedure. Please advise.

## 2024-01-27 ENCOUNTER — Ambulatory Visit: Attending: Cardiology | Admitting: Cardiology

## 2024-01-27 ENCOUNTER — Encounter: Payer: Self-pay | Admitting: Cardiology

## 2024-01-27 VITALS — BP 134/74 | HR 72 | Resp 16 | Ht 69.0 in | Wt 172.6 lb

## 2024-01-27 DIAGNOSIS — I5032 Chronic diastolic (congestive) heart failure: Secondary | ICD-10-CM

## 2024-01-27 DIAGNOSIS — I739 Peripheral vascular disease, unspecified: Secondary | ICD-10-CM

## 2024-01-27 DIAGNOSIS — E782 Mixed hyperlipidemia: Secondary | ICD-10-CM

## 2024-01-27 DIAGNOSIS — Z7901 Long term (current) use of anticoagulants: Secondary | ICD-10-CM

## 2024-01-27 DIAGNOSIS — I288 Other diseases of pulmonary vessels: Secondary | ICD-10-CM

## 2024-01-27 DIAGNOSIS — Z01810 Encounter for preprocedural cardiovascular examination: Secondary | ICD-10-CM | POA: Diagnosis not present

## 2024-01-27 DIAGNOSIS — I480 Paroxysmal atrial fibrillation: Secondary | ICD-10-CM

## 2024-01-27 DIAGNOSIS — F1010 Alcohol abuse, uncomplicated: Secondary | ICD-10-CM

## 2024-01-27 DIAGNOSIS — I10 Essential (primary) hypertension: Secondary | ICD-10-CM

## 2024-01-27 DIAGNOSIS — F17200 Nicotine dependence, unspecified, uncomplicated: Secondary | ICD-10-CM

## 2024-01-27 NOTE — Patient Instructions (Addendum)
 Medication Instructions:  No medication changes were made at this visit. Continue current regimen.   *If you need a refill on your cardiac medications before your next appointment, please call your pharmacy*  Testing/Procedures: Your physician has requested that you have a lower extremity arterial duplex. This test is an ultrasound of the arteries in the legs or arms. It looks at arterial blood flow in the legs and arms. Allow one hour for Lower and Upper Arterial scans. There are no restrictions or special instructions.  Please note: We ask at that you not bring children with you during ultrasound (echo/ vascular) testing. Due to room size and safety concerns, children are not allowed in the ultrasound rooms during exams. Our front office staff cannot provide observation of children in our lobby area while testing is being conducted. An adult accompanying a patient to their appointment will only be allowed in the ultrasound room at the discretion of the ultrasound technician under special circumstances. We apologize for any inconvenience.   Follow-Up: At [facility], you and your health needs are our priority.  As part of our continuing mission to provide you with exceptional heart care, our providers are all part of one team.  This team includes your primary Cardiologist (physician) and Advanced Practice Providers or APPs (Physician Assistants and Nurse Practitioners) who all work together to provide you with the care you need, when you need it.  Your next appointment:   1 year(s) At the end of July 2026  Provider:   Madonna Large, DO    Other Instructions  HOLD apixaban  (Eliquis ) 2 days before your procedure and the day of your procedure.  Please resume your Eliquis  routine after your procedure according to the physician completing the procedure.

## 2024-01-27 NOTE — Progress Notes (Signed)
 Cardiology Office Note:  .   ID:  H[place] [name], DOB 06/02/1956, MRN [id number] PCP:  [place] Leni Edyth DELENA, MD  Former Cardiology Providers: Emmalene Lawrence, APRN, FNP-C, Dr. Gordy Bergamo  [facility] Providers Cardiologist:  Madonna Large, DO , [facility] (established care 01/05/2020) Electrophysiologist:  None  Click to update primary MD,subspecialty MD or APP then REFRESH:1}    Chief Complaint  Patient presents with   Atrial Fibrillation   New Patient (Initial Visit)    History of Present Illness: .   Hector [name] is a 68 y.o. African-American male whose past medical history and cardiovascular risk factors includes: Chronic HFpEF, paroxysmal atrial fibrillation, establish peripheral artery disease with prior intervention and now no claudication, mixed hyperlipidemia, hypertension, alcohol abuse, and tobacco use disorder.   Was being followed in the practice given his history of HFpEF, paroxysmal atrial fibrillation, and PAD.  He has been lost to follow-up.  Last office visit was in September 2023.  Now referred back to clinic.  HFpEF: Denies heart failure symptoms. Compliant with GDMT  Paroxysmal atrial fibrillation: On Cardizem  for rate control strategy and Eliquis  for thromboembolic prophylaxis.  Patient states that his [place] provider has been refilling anticoagulation and monitoring hemoglobin levels.  Patient does not endorse evidence of bleeding. Patient was drinking at least 2 cans/day each being 25 ounces.  Patient states now he is drinking no more than 1 beer per week  PAD: Left SFA was noted to have 80% stenosis in the midsegment at 40% of the proximal segment without gradient. He underwent successful PTA with 5 x 40 mm Mustang and 6 x 80 mm drug-coated balloon in the mid left SFA with 0% residual stenosis. He denies claudication. Continues to smoke 1 pack/day. In the past stop taking cilostazol  secondary to dizziness.   Patient was recently seen by gastroenterology on 12/16/2023  for consideration of screening colonoscopy.  Preprocedure clearance is requested and guidance with regards to anticoagulation.  Review of Systems: .   Review of Systems  Cardiovascular:  Negative for chest pain, claudication, irregular heartbeat, leg swelling, near-syncope, orthopnea, palpitations, paroxysmal nocturnal dyspnea and syncope.  Respiratory:  Negative for shortness of breath.   Hematologic/Lymphatic: Negative for bleeding problem.    Studies Reviewed:   EKG: EKG Interpretation Date/Time:  Thursday January 27 2024 14:27:25 EDT Ventricular Rate:  74 PR Interval:  148 QRS Duration:  96 QT Interval:  386 QTC Calculation: 428 R Axis:   99  Text Interpretation: Normal sinus rhythm Rightward axis T wave inversion Inferior leads When compared with ECG of 28-Jan-2023 06:15, Inverted T waves have replaced nonspecific T wave abnormality in Inferior leads Confirmed by Large Madonna [phone]) on 01/27/2024 3:06:17 PM  Echocardiogram: 12/05/2021: LVEF 60 to 65%, no regional wall motion abnormality, grade 1 diastolic dysfunction, right ventricular systolic function normal sinus, tricuspid regurgitation is inadequate for assessment of PA pressures, estimated RAP 15 mmHg.   Stress Testing:  Lexiscan  (with Mod Bruce protocol) Nuclear stress test 03/04/22 Myocardial perfusion is normal. Low risk study. Overall LV systolic function is normal without regional wall motion abnormalities. Stress LV EF: 74%.  Nondiagnostic ECG stress. The heart rate response was consistent with Regadenoson . The blood pressure response was physiologic. No previous exam available for comparison.   Heart Catheterization: None   Abdominal aortic runoff with peripheral vascular intervention: 09/06/2018: Patent renal arteries Normal distal abdominal aorta & bilateral EIA and Rt Internal iliac artery Lt Internal iliac artery 50% stenosis Rt SFA mid 30%  stenosis Lt SFA mid 80% stenosis prox 40% stenosis without pullback  gradient Patent Lt PTA. Occluded Lt ATA and peroneal artery. Peroneal artery reconstitutes with collaterals to have two vessel runoff at ankle level Successful PTA 5.0 X 40 mm Mustang & 6.0 X 80 mm In.Pact Admiral DCB in left mid SFA 0% residual stenosis  RADIOLOGY: NA  Risk Assessment/Calculations:   Click Here to Calculate/Change CHADS2VASc Score The patient's CHADS2-VASc score is 4, indicating a 4.8% annual risk of stroke.   CHF History: Yes HTN History: Yes Diabetes History: No Stroke History: No Vascular Disease History: Yes  Labs:       Latest Ref Rng & Units 01/28/2023    6:53 AM 03/02/2022    9:25 AM 12/15/2021    3:13 AM  CBC  WBC 4.0 - 10.5 K/uL 8.3   19.3   Hemoglobin 13.0 - 17.0 g/dL 82.2  83.8  88.6   Hematocrit 39.0 - 52.0 % 52.0  51.4  37.5   Platelets 150 - 400 K/uL 288   517        Latest Ref Rng & Units 01/28/2023    6:53 AM 03/20/2022   11:20 AM 03/02/2022    9:26 AM  BMP  Glucose 70 - 99 mg/dL 899  81  897   BUN 8 - 23 mg/dL 11  11  13    Creatinine 0.61 - 1.24 mg/dL 9.03  9.01  8.66   BUN/Creat Ratio 10 - 24  11  10    Sodium 135 - 145 mmol/L 136  142  142   Potassium 3.5 - 5.1 mmol/L 3.3  4.4  4.4   Chloride 98 - 111 mmol/L 106  103  105   CO2 22 - 32 mmol/L 17  22  20    Calcium  8.9 - 10.3 mg/dL 9.1  9.8  9.8       Latest Ref Rng & Units 01/28/2023    6:53 AM 03/20/2022   11:20 AM 03/02/2022    9:26 AM  CMP  Glucose 70 - 99 mg/dL 899  81  897   BUN 8 - 23 mg/dL 11  11  13    Creatinine 0.61 - 1.24 mg/dL 9.03  9.01  8.66   Sodium 135 - 145 mmol/L 136  142  142   Potassium 3.5 - 5.1 mmol/L 3.3  4.4  4.4   Chloride 98 - 111 mmol/L 106  103  105   CO2 22 - 32 mmol/L 17  22  20    Calcium  8.9 - 10.3 mg/dL 9.1  9.8  9.8     Lab Results  Component Value Date   CHOL 98 12/11/2021   HDL 52 12/11/2021   LDLCALC 32 12/11/2021   TRIG 71 12/11/2021   CHOLHDL 1.9 12/11/2021   No results for input(s): LIPOA in the last 8760 hours. No components found  for: NTPROBNP No results for input(s): PROBNP in the last 8760 hours. No results for input(s): TSH in the last 8760 hours.  Physical Exam:    Today's Vitals   01/27/24 1423  BP: 134/74  Pulse: 72  Resp: 16  SpO2: 91%  Weight: 172 lb 9.6 oz (78.3 kg)  Height: 5' 9 (1.753 m)   Body mass index is 25.49 kg/m. Wt Readings from Last 3 Encounters:  01/27/24 172 lb 9.6 oz (78.3 kg)  12/16/23 172 lb (78 kg)  07/28/22 178 lb 12.8 oz (81.1 kg)    Physical Exam  Constitutional: No distress.  hemodynamically  stable  Neck: No JVD present.  Cardiovascular: Normal rate, regular rhythm, S1 normal and S2 normal. Exam reveals no gallop, no S3 and no S4.  No murmur heard. Pulses:      Femoral pulses are 2+ on the right side and 2+ on the left side.      Popliteal pulses are 2+ on the right side and 1+ on the left side.       Dorsalis pedis pulses are 1+ on the right side and 1+ on the left side.       Posterior tibial pulses are 2+ on the right side and 2+ on the left side.  Pulmonary/Chest: Effort normal and breath sounds normal. No stridor. He has no wheezes. He has no rales.  Musculoskeletal:        General: No edema.     Cervical back: Neck supple.  Skin: Skin is warm.    Impression & Recommendation(s):  Impression:   ICD-10-CM   1. Paroxysmal atrial fibrillation (HCC)  I48.0 EKG 12-Lead    2. Long term (current) use of anticoagulants  Z79.01     3. Preprocedural cardiovascular examination  Z01.810     4. Chronic heart failure with preserved ejection fraction (HFpEF) (HCC)  I50.32     5. PAD (peripheral artery disease) (HCC)  I73.9 VAS US  LOWER EXTREMITY ARTERIAL DUPLEX    6. Mixed hyperlipidemia  E78.2     7. Benign essential hypertension  I10     8. Dilated pulmonary trunk (HCC)  I28.8     9. Alcohol abuse  F10.10     10. Tobacco use disorder  F17.200        Recommendation(s):  Paroxysmal atrial fibrillation (HCC) Rate control: Cardizem . Rhythm control:  N/A. Thromboembolic prophylaxis with Eliquis  Recommended reducing alcohol consumption to no more than 1 beverage per day, standard drink send  Long term (current) use of anticoagulants Currently on Eliquis . Does not endorse evidence of bleeding. Patient states that hemoglobins are being managed by PCP at the [place]. Since he was lost to follow-up Eliquis  was also being filled by his [place] provider  Preprocedural cardiovascular examination Being considered for colonoscopy. Patient is considered to be acceptable risk for upcoming noncardiac procedure. Recommend holding Eliquis  2 days prior to the procedure and the day of.  He may restart Eliquis  once cleared by gastroenterology based on clinical findings and make sure appropriate hemostasis is achieved.  Chronic heart failure with preserved ejection fraction (HFpEF) (HCC) Last hospitalization for heart failure exacerbation in June 2023 Has very poor insight, his comorbidities secondary to health illiteracy Has been lost to follow-up for some time now. Currently on Entresto  24/26 mg p.o. twice daily. Currently on Jardiance 25 mg p.o. daily. Recommend strict I's and O's and daily weights.  PAD (peripheral artery disease) (HCC) Denies claudication. Currently on aspirin  and anticoagulation for reasons mentioned above We will order a lower extremity arterial duplex bilaterally prior to the next office visit  Mixed hyperlipidemia Currently on Crestor  20 mg p.o. nightly Lipids are currently managed by PCP-did not have the most recent lipid profile available for review Recommend a goal LDL <70 mg/dL and if possible closer to 55 mg/dL  Benign essential hypertension Office blood pressures are acceptable. Medications as discussed above  Dilated pulmonary trunk (HCC) Incidentally noted on CT high-resolution study 12/10/2021 Echo: Preserved LVEF, RV size and function preserved, no significant TR signal to comment on RVSP or PASP.  Her right  ventricular function is preserved based on S  prime and TAPSE.  VQ scan: Low probability for PE. HIV screen: Nonreactive. Angiotensin-converting enzymes: Within normal limits. C-ANCA and p-ANCA within normal limits. Pretest probability of him having WHO class I PAH is low. In the past patient has refused recommend catheterization for further risk stratification.  If he illustrates compliance with follow-up we will reconsider further evaluation.  Orders Placed:  Orders Placed This Encounter  Procedures   EKG 12-Lead     Final Medication List:   No orders of the defined types were placed in this encounter.   Medications Discontinued During This Encounter  Medication Reason   Tiotropium Bromide-Olodaterol (STIOLTO RESPIMAT ) 2.5-2.5 MCG/ACT AERS Patient Preference   potassium chloride  SA (KLOR-CON  M) 20 MEQ tablet Patient Preference   nicotine  (NICODERM CQ  - DOSED IN MG/24 HOURS) 14 mg/24hr patch Patient Preference   fluticasone (FLONASE) 50 MCG/ACT nasal spray Patient Preference   albuterol  (VENTOLIN  HFA) 108 (90 Base) MCG/ACT inhaler Patient Preference   diclofenac  Sodium (VOLTAREN ) 1 % GEL Patient Preference     Current Outpatient Medications:    apixaban  (ELIQUIS ) 5 MG TABS tablet, Take 1 tablet (5 mg total) by mouth 2 (two) times daily., Disp: 60 tablet, Rfl: 2   aspirin  EC 81 MG tablet, TAKE ONE TABLET BY MOUTH DAILY FOR HEART, Disp: , Rfl:    diltiazem  (CARDIZEM  CD) 180 MG 24 hr capsule, Take 1 capsule (180 mg total) by mouth daily., Disp: 30 capsule, Rfl: 2   empagliflozin (JARDIANCE) 25 MG TABS tablet, Take 1 tablet by mouth daily., Disp: , Rfl:    ferrous sulfate  325 (65 FE) MG tablet, Take 1 tablet (325 mg total) by mouth daily with breakfast., Disp: 30 tablet, Rfl: 2   Multiple Vitamins-Minerals (MULTIVITAMIN MEN 50+ PO), Take by mouth daily., Disp: , Rfl:    omeprazole (PRILOSEC OTC) 20 MG tablet, Take 20 mg by mouth daily., Disp: , Rfl:    rosuvastatin  (CRESTOR ) 20 MG  tablet, Take 1 tablet (20 mg total) by mouth at bedtime., Disp: 90 tablet, Rfl: 0   sacubitril -valsartan  (ENTRESTO ) 24-26 MG, Take 1 tablet by mouth 2 (two) times daily., Disp: , Rfl:    tamsulosin (FLOMAX) 0.4 MG CAPS capsule, Take 1 capsule by mouth daily., Disp: , Rfl:   Consent:   NA  Disposition:   1 year follow-up sooner if needed  His questions and concerns were addressed to his satisfaction. He voices understanding of the recommendations provided during this encounter.    Signed, Madonna Michele HAS, [facility]  A Division of [facility] [address], [place], [place] [address]  [place], [place] [address]

## 2024-02-01 ENCOUNTER — Encounter: Payer: Self-pay | Admitting: Cardiology

## 2024-02-11 ENCOUNTER — Ambulatory Visit (HOSPITAL_BASED_OUTPATIENT_CLINIC_OR_DEPARTMENT_OTHER)
Admission: RE | Admit: 2024-02-11 | Discharge: 2024-02-11 | Disposition: A | Discharge status: Home / Self Care | Source: Ambulatory Visit | Attending: Vascular Surgery | Admitting: Vascular Surgery

## 2024-02-11 ENCOUNTER — Other Ambulatory Visit: Payer: Self-pay | Admitting: Cardiology

## 2024-02-11 ENCOUNTER — Ambulatory Visit (HOSPITAL_COMMUNITY)
Admission: RE | Admit: 2024-02-11 | Discharge: 2024-02-11 | Disposition: A | Discharge status: Home / Self Care | Source: Ambulatory Visit | Attending: Cardiology | Admitting: Cardiology

## 2024-02-11 DIAGNOSIS — I739 Peripheral vascular disease, unspecified: Secondary | ICD-10-CM

## 2024-02-11 LAB — VAS US ABI WITH/WO TBI
Left ABI: 1.11
Right ABI: 0.9

## 2024-02-13 ENCOUNTER — Ambulatory Visit: Payer: Self-pay | Admitting: Cardiology

## 2024-02-29 ENCOUNTER — Other Ambulatory Visit: Payer: Self-pay

## 2024-02-29 DIAGNOSIS — Z1211 Encounter for screening for malignant neoplasm of colon: Secondary | ICD-10-CM

## 2024-02-29 NOTE — Progress Notes (Signed)
 Pt scheduled for previsit 03/13/24@1pm , colon scheduled at [facility] hospital 03/30/24@8 :15am. [phone]. Pt aware of appts.

## 2024-03-13 ENCOUNTER — Ambulatory Visit (AMBULATORY_SURGERY_CENTER): Admitting: *Deleted

## 2024-03-13 VITALS — Ht 69.0 in | Wt 172.0 lb

## 2024-03-13 DIAGNOSIS — Z1211 Encounter for screening for malignant neoplasm of colon: Secondary | ICD-10-CM

## 2024-03-13 MED ORDER — PEG-KCL-NACL-NASULF-NA ASC-C 100 G PO SOLR: 1.0000 | ORAL | Status: DC

## 2024-03-13 NOTE — Progress Notes (Signed)
 Pt's name and DOB verified at the beginning of the pre-visit with 2 identifiers  Permission given to speak with  Pt denies any difficulty with ambulating,sitting, laying down or rolling side to side  Pt has no issues moving head neck or swallowing  No egg or soy allergy known to patient   No issues known to pt with past sedation  No FH of Malignant Hyperthermia  Pt is not on home 02   Pt is not on blood thinners   Pt denies issues with constipation   Pt is not on dialysis  Pt denise any abnormal heart rhythms   Pt denies any upcoming cardiac testing  Visit in person  Pt states weight is   Pt Scale weight is  Pt given  both [facility] main # and MD on call # prior to instructions.  Informed pt to come in at the time discussed and is shown on PV instructions. Instructions given to pt Instructed pt on all aspects of written instructions including med holds clothing to wear and foods to eat and not eat as well as after procedure legal restrictions and to call MD on call if needed.. Pt states understanding. Instructed pt to review instructions again prior to procedure and call main # given if has any questions or any issues. Pt states they will.

## 2024-03-22 ENCOUNTER — Telehealth: Payer: Self-pay | Admitting: Gastroenterology

## 2024-03-22 NOTE — Telephone Encounter (Addendum)
 Procedure:Colonoscopy Procedure date: 03/30/24 Procedure location: [facility] Arrival Time: 6:45 am Spoke with the patient Y/N:   No, I left a detailed message on [phone] on 03/22/24 @ 2:39 pm for the patient to return call No, I left a detailed message on [phone] on 03/23/24 @ 4:25 pm for the patient to return call, called [phone] and call could not be completed as dialed. Yes 03/24/24 @ 10:59 AM   Any prep concerns? No  Has the patient obtained the prep from the pharmacy ? No, prep resent to [facility] Do you have a care partner and transportation: No, patient was told that he needs a care partner, pt stated that he would see what he could do. I told the patient he would receive a call form the hospital to go over any instructions they require and he could ask then. Any additional concerns? No

## 2024-03-23 NOTE — Telephone Encounter (Signed)
 Inbound call from patient stated that he is aware of his colonoscopy at the [facility]. Patient was advised that he would need to be there at 6:45 AM. Patient also stated that he does not have his prep medication as of yet. Patient also stated that he does not have a care partner, but that he could just take a cab back home. Please advise.

## 2024-03-24 ENCOUNTER — Other Ambulatory Visit: Payer: Self-pay | Admitting: Gastroenterology

## 2024-03-24 ENCOUNTER — Telehealth: Payer: Self-pay | Admitting: Gastroenterology

## 2024-03-24 DIAGNOSIS — Z1211 Encounter for screening for malignant neoplasm of colon: Secondary | ICD-10-CM

## 2024-03-24 MED ORDER — PEG-KCL-NACL-NASULF-NA ASC-C 100 G PO SOLR: 1.0000 | Freq: Once | ORAL | 0 refills | Status: AC

## 2024-03-24 NOTE — Telephone Encounter (Signed)
 RN returned call to patient who stated he had not gotten his prep medication for his procedure at [facility] on 03/30/24. Patient asked if medication could be sent to his house, but RN stated he would need to get it from his pharmacy; patient stated understanding. RN confirmed patient's preferred pharmacy. RN also confirmed that patient has his instructions about how to prepare for his procedure as well as how to take his prep medication.   Patient had no further questions.

## 2024-03-24 NOTE — Telephone Encounter (Signed)
 Fyi Got a message from the pharmacy saying prep not covered.

## 2024-03-24 NOTE — Telephone Encounter (Signed)
 Inbound call from patient stating he has upcoming procedure on 03/30/24 at [facility] but has not received prep for colonoscopy. Patient also wanted to know if he had to pick prep up or was it going to be sent out. Would like to know where it is sent to.  Please advise  Thank you

## 2024-03-27 ENCOUNTER — Telehealth: Payer: Self-pay | Admitting: Gastroenterology

## 2024-03-27 DIAGNOSIS — Z1211 Encounter for screening for malignant neoplasm of colon: Secondary | ICD-10-CM

## 2024-03-27 MED ORDER — PEG-KCL-NACL-NASULF-NA ASC-C 100 G PO SOLR: 1.0000 | Freq: Once | ORAL | Status: DC

## 2024-03-27 NOTE — Telephone Encounter (Signed)
 RN returned call to patient who is requesting that his colon prep medication, Moviprep , be sent to the [facility]. Patient states he spoke with [facility] and they confirmed that they will cover this colon prep medication.  RN sent new prescription for Moviprep  to [facility] pharmacy. All patient questions were answered.

## 2024-03-27 NOTE — Telephone Encounter (Signed)
 Received a call from patient stating recently completed Prep-appt, but insurance is refusing to cover prescriptions. Requesting nurse fu. Please review and advise  Thank you

## 2024-03-27 NOTE — Telephone Encounter (Signed)
 Inbound call from pt requesting that we send his Prep medication Moviprep  over to the [place] pharamcy in order for the [facility] to pay for it. Patient stated that a good call back number to the [place] in [place] is [phone]. Please advise.

## 2024-03-28 MED ORDER — PEG-KCL-NACL-NASULF-NA ASC-C 100 G PO SOLR: 1.0000 | Freq: Once | ORAL | 0 refills | Status: AC

## 2024-03-28 NOTE — Addendum Note (Signed)
 Addended by: LORRENE ASHLEY BRAVO on: 03/28/2024 10:07 AM   Modules accepted: Orders

## 2024-03-28 NOTE — Telephone Encounter (Signed)
 Movpirep RX sent in to [facility] pharmacy per request;

## 2024-03-28 NOTE — Telephone Encounter (Signed)
 Inbound call from [facility] pharmacy stating they have not received prescription for pre. Requesting it be resent to [phone]. Please advise.

## 2024-03-30 ENCOUNTER — Other Ambulatory Visit: Payer: Self-pay

## 2024-03-30 ENCOUNTER — Telehealth: Payer: Self-pay

## 2024-03-30 ENCOUNTER — Encounter (HOSPITAL_COMMUNITY): Admission: RE | Payer: Self-pay | Source: Home / Self Care

## 2024-03-30 ENCOUNTER — Ambulatory Visit (HOSPITAL_COMMUNITY): Admission: RE | Admit: 2024-03-30 | Source: Home / Self Care | Admitting: Gastroenterology

## 2024-03-30 DIAGNOSIS — Z1211 Encounter for screening for malignant neoplasm of colon: Secondary | ICD-10-CM

## 2024-03-30 SURGERY — COLONOSCOPY
Anesthesia: Monitor Anesthesia Care

## 2024-03-30 NOTE — Telephone Encounter (Signed)
 Patient's colonoscopy rescheduled to [facility] hospital on 05/15/24 at 10:30 am, arriving at 9:00 am. Case ID # [id number]. Patient reports he still has his Movi prep kit for the procedure and expresses he would like new instructions mailed to him. Informed patient I will mail him new instructions and to contact our office with any questions. Patient verbalized understanding. Patient is aware to again hold Eliquis  2 days prior to his procedure(approval for hold given in office encounter on 01/27/24) .

## 2024-05-08 ENCOUNTER — Encounter (HOSPITAL_COMMUNITY): Payer: Self-pay | Admitting: Gastroenterology

## 2024-05-08 ENCOUNTER — Telehealth: Payer: Self-pay | Admitting: Gastroenterology

## 2024-05-08 NOTE — Telephone Encounter (Addendum)
 Procedure:Colonoscopy Procedure date: 05/15/24 Procedure location: [facility] Arrival Time: 8:56 am Spoke with the patient Y/N:   No, I left a detailed message on [phone] on 05/08/24 @ 9:53 am for the patient to return call  Yes, 05/08/24 @ 10:29 am   Any prep concerns? No  Has the patient obtained the prep from the pharmacy ? Yes Do you have a care partner and transportation: Yes Any additional concerns? No

## 2024-05-13 NOTE — Anesthesia Preprocedure Evaluation (Addendum)
 Anesthesia Evaluation  Patient identified by MRN, date of birth, ID band Patient awake    Reviewed: Allergy & Precautions, NPO status , Patient's Chart, lab work & pertinent test results  History of Anesthesia Complications Negative for: history of anesthetic complications  Airway Mallampati: II  TM Distance: >3 FB Neck ROM: Full    Dental no notable dental hx. (+) Teeth Intact,    Pulmonary COPD, Current Smoker and Patient abstained from smoking.   Pulmonary exam normal breath sounds clear to auscultation       Cardiovascular hypertension, (-) angina +CHF (HFpEF)  (-) Past MI Normal cardiovascular exam+ dysrhythmias (on Eliquis ) Atrial Fibrillation  Rhythm:Regular Rate:Normal  2023 TTE  Left Ventricle: Left ventricular ejection fraction, by estimation, is 60  to 65%. The left ventricle has normal function. The left ventricle has no  regional wall motion abnormalities. The left ventricular internal cavity  size was normal in size. There is   no left ventricular hypertrophy. Left ventricular diastolic parameters  are consistent with Grade I diastolic     Neuro/Psych    GI/Hepatic ,GERD  Medicated and Controlled,,  Endo/Other  neg diabetes    Renal/GU      Musculoskeletal   Abdominal   Peds  Hematology   Anesthesia Other Findings   Reproductive/Obstetrics                              Anesthesia Physical Anesthesia Plan  ASA: 3  Anesthesia Plan: MAC   Post-op Pain Management: Minimal or no pain anticipated   Induction: Intravenous  PONV Risk Score and Plan: 1 and Treatment may vary due to age or medical condition and Propofol  infusion  Airway Management Planned: Natural Airway and Nasal Cannula  Additional Equipment: None  Intra-op Plan:   Post-operative Plan:   Informed Consent: I have reviewed the patients History and Physical, chart, labs and discussed the procedure  including the risks, benefits and alternatives for the proposed anesthesia with the patient or authorized representative who has indicated his/her understanding and acceptance.     Dental advisory given  Plan Discussed with: CRNA and Surgeon  Anesthesia Plan Comments: (Colonoscopy for screening)         Anesthesia Quick Evaluation

## 2024-05-15 ENCOUNTER — Ambulatory Visit (HOSPITAL_COMMUNITY)
Admission: RE | Admit: 2024-05-15 | Discharge: 2024-05-15 | Disposition: A | Discharge status: Home / Self Care | Attending: Gastroenterology | Admitting: Gastroenterology

## 2024-05-15 ENCOUNTER — Other Ambulatory Visit: Payer: Self-pay

## 2024-05-15 ENCOUNTER — Encounter (HOSPITAL_COMMUNITY)
Admission: RE | Disposition: A | Discharge status: Home / Self Care | Payer: Self-pay | Source: Home / Self Care | Attending: Gastroenterology

## 2024-05-15 ENCOUNTER — Ambulatory Visit (HOSPITAL_BASED_OUTPATIENT_CLINIC_OR_DEPARTMENT_OTHER): Admitting: Anesthesiology

## 2024-05-15 ENCOUNTER — Ambulatory Visit (HOSPITAL_COMMUNITY): Admitting: Anesthesiology

## 2024-05-15 ENCOUNTER — Encounter (HOSPITAL_COMMUNITY): Payer: Self-pay | Admitting: Gastroenterology

## 2024-05-15 DIAGNOSIS — I4891 Unspecified atrial fibrillation: Secondary | ICD-10-CM | POA: Insufficient documentation

## 2024-05-15 DIAGNOSIS — I5031 Acute diastolic (congestive) heart failure: Secondary | ICD-10-CM | POA: Diagnosis not present

## 2024-05-15 DIAGNOSIS — K219 Gastro-esophageal reflux disease without esophagitis: Secondary | ICD-10-CM | POA: Diagnosis not present

## 2024-05-15 DIAGNOSIS — D125 Benign neoplasm of sigmoid colon: Secondary | ICD-10-CM

## 2024-05-15 DIAGNOSIS — I5030 Unspecified diastolic (congestive) heart failure: Secondary | ICD-10-CM | POA: Diagnosis not present

## 2024-05-15 DIAGNOSIS — I110 Hypertensive heart disease with heart failure: Secondary | ICD-10-CM | POA: Insufficient documentation

## 2024-05-15 DIAGNOSIS — Z7984 Long term (current) use of oral hypoglycemic drugs: Secondary | ICD-10-CM | POA: Diagnosis not present

## 2024-05-15 DIAGNOSIS — J449 Chronic obstructive pulmonary disease, unspecified: Secondary | ICD-10-CM | POA: Insufficient documentation

## 2024-05-15 DIAGNOSIS — Z7901 Long term (current) use of anticoagulants: Secondary | ICD-10-CM | POA: Diagnosis not present

## 2024-05-15 DIAGNOSIS — F17210 Nicotine dependence, cigarettes, uncomplicated: Secondary | ICD-10-CM | POA: Insufficient documentation

## 2024-05-15 DIAGNOSIS — Z7982 Long term (current) use of aspirin: Secondary | ICD-10-CM | POA: Insufficient documentation

## 2024-05-15 DIAGNOSIS — K5730 Diverticulosis of large intestine without perforation or abscess without bleeding: Secondary | ICD-10-CM | POA: Insufficient documentation

## 2024-05-15 DIAGNOSIS — Z1211 Encounter for screening for malignant neoplasm of colon: Secondary | ICD-10-CM | POA: Diagnosis present

## 2024-05-15 DIAGNOSIS — Z79899 Other long term (current) drug therapy: Secondary | ICD-10-CM | POA: Diagnosis not present

## 2024-05-15 HISTORY — PX: COLONOSCOPY: SHX5424

## 2024-05-15 SURGERY — COLONOSCOPY
Anesthesia: Monitor Anesthesia Care

## 2024-05-15 MED ORDER — PROPOFOL 500 MG/50ML IV EMUL
INTRAVENOUS | Status: DC | PRN
  Administered 2024-05-15: 125 ug/kg/min via INTRAVENOUS

## 2024-05-15 MED ORDER — PROPOFOL 10 MG/ML IV BOLUS
INTRAVENOUS | Status: DC | PRN
  Administered 2024-05-15: 40 mg via INTRAVENOUS

## 2024-05-15 MED ORDER — SODIUM CHLORIDE 0.9 % IV SOLN: INTRAVENOUS | Status: DC

## 2024-05-15 MED ORDER — PROPOFOL 500 MG/50ML IV EMUL
INTRAVENOUS | Status: AC
  Filled 2024-05-15: qty 100

## 2024-05-15 MED ORDER — LIDOCAINE 2% (20 MG/ML) 5 ML SYRINGE
INTRAMUSCULAR | Status: DC | PRN
  Administered 2024-05-15: 50 mg via INTRAVENOUS

## 2024-05-15 NOTE — Transfer of Care (Signed)
 Immediate Anesthesia Transfer of Care Note  Patient: [place] [name]  Procedure(s) Performed: COLONOSCOPY  Patient Location: Endoscopy Unit  Anesthesia Type:MAC  Level of Consciousness: drowsy  Airway & Oxygen Therapy: Patient Spontanous Breathing and Patient connected to face mask  Post-op Assessment: Report given to RN and Post -op Vital signs reviewed and stable  Post vital signs: Reviewed and stable  Last Vitals:  Vitals Value Taken Time  BP    Temp    Pulse    Resp    SpO2      Last Pain:  Vitals:   05/15/24 0924  TempSrc: Temporal  PainSc: 0-No pain         Complications: No notable events documented.

## 2024-05-15 NOTE — Op Note (Signed)
 [facility] Patient Name: Hector [name] Procedure Date: 05/15/2024 MRN: [id number] Attending MD: Glendia BRAVO. Stacia , MD, [id number] Date of Birth: 01/26/1956 CSN: [id number] Age: 68 Admit Type: Outpatient Procedure:                Colonoscopy Indications:              Screening for colorectal malignant neoplasm (last                            colonoscopy was 10 years ago) Providers:                Glendia E. Stacia, MD, Darleene Bare, RN, Janie                            Billups, Technician, Farris Southgate, Technician, Renay Darner Referring MD:              Medicines:                Monitored Anesthesia Care Complications:            No immediate complications. Estimated Blood Loss:     Estimated blood loss was minimal. Procedure:                Pre-Anesthesia Assessment:                           - Prior to the procedure, a History and Physical                            was performed, and patient medications and                            allergies were reviewed. The patient's tolerance of                            previous anesthesia was also reviewed. The risks                            and benefits of the procedure and the sedation                            options and risks were discussed with the patient.                            All questions were answered, and informed consent                            was obtained. Prior Anticoagulants: The patient has                            taken Eliquis  (apixaban ), last dose was 3 days  prior to procedure. ASA Grade Assessment: III - A                            patient with severe systemic disease. After                            reviewing the risks and benefits, the patient was                            deemed in satisfactory condition to undergo the                            procedure.                           After obtaining informed consent, the  colonoscope                            was passed under direct vision. Throughout the                            procedure, the patient's blood pressure, pulse, and                            oxygen saturations were monitored continuously. The                            CF-HQ190L ([id number]) Olympus colonoscope was                            introduced through the anus and advanced to the the                            cecum, identified by appendiceal orifice and                            ileocecal valve. The colonoscopy was performed                            without difficulty. The patient tolerated the                            procedure well. The quality of the bowel                            preparation was adequate. The ileocecal valve,                            appendiceal orifice, and rectum were photographed.                            The bowel preparation used was SUPREP via split  dose instruction. Scope In: 11:53:47 AM Scope Out: 12:15:18 PM Scope Withdrawal Time: 0 hours 15 minutes 33 seconds  Total Procedure Duration: 0 hours 21 minutes 31 seconds  Findings:      The perianal and digital rectal examinations were normal. Pertinent       negatives include normal sphincter tone and no palpable rectal lesions.      A 4 mm polyp was found in the sigmoid colon. The polyp was       semi-pedunculated. The polyp was removed with a cold snare. Resection       and retrieval were complete. Estimated blood loss was minimal.      Many large-mouthed and small-mouthed diverticula were found in the       sigmoid colon and descending colon. There was narrowing of the colon in       association with the diverticular opening. There was evidence of       diverticular spasm.      The exam was otherwise normal throughout the examined colon.      The retroflexed view of the distal rectum and anal verge was normal and       showed no anal or rectal  abnormalities. Impression:               - One 4 mm polyp in the sigmoid colon, removed with                            a cold snare. Resected and retrieved.                           - Severe diverticulosis in the sigmoid colon and in                            the descending colon. There was narrowing of the                            colon in association with the diverticular opening.                            There was evidence of diverticular spasm.                           - The distal rectum and anal verge are normal on                            retroflexion view. Moderate Sedation:      Not Applicable - Patient had care per Anesthesia. Recommendation:           - Patient has a contact number available for                            emergencies. The signs and symptoms of potential                            delayed complications were discussed with the                            patient.  Return to normal activities tomorrow.                            Written discharge instructions were provided to the                            patient.                           - Resume previous diet.                           - Continue present medications.                           - Await pathology results.                           - Repeat colonoscopy (date not yet determined) for                            surveillance based on pathology results.                           - Resume Eliquis  (apixaban ) at prior dose tomorrow.                           - Recommend daily fiber supplement or high fiber                            diet to reduce risk of diverticular complications. Procedure Code(s):        --- Professional ---                           [phone], Colonoscopy, flexible; with removal of                            tumor(s), polyp(s), or other lesion(s) by snare                            technique Diagnosis Code(s):        --- Professional ---                           Z12.11,  Encounter for screening for malignant                            neoplasm of colon                           D12.5, Benign neoplasm of sigmoid colon                           K57.30, Diverticulosis of large intestine without                            perforation or abscess  without bleeding CPT copyright 2022 American [employer] Association. All rights reserved. The codes documented in this report are preliminary and upon coder review may  be revised to meet current compliance requirements. Somara Frymire E. Stacia, MD 05/15/2024 12:20:50 PM This report has been signed electronically. Number of Addenda: 0

## 2024-05-15 NOTE — Discharge Instructions (Addendum)

## 2024-05-15 NOTE — H&P (Signed)
 [facility] History and Physical   Primary Care Physician:  Maree Leni Edyth DELENA, MD   Reason for Procedure:   Colon cancer screening  Plan:    Screening colonoscopy     HPI: Hector [name] is a 68 y.o. male undergoing average risk screening colonoscopy.  He has no family history of colon cancer and no chronic GI symptoms.  He reportedly had a normal colonoscopy 10 years ago.  He takes Eliquis  for a-fib, last dose Nov 7th  The patient was provided an opportunity to ask questions and all were answered. The patient agreed with the plan    Past Medical History:  Diagnosis Date   Anemia    Arthritis    BPH (benign prostatic hyperplasia)    Claudication in peripheral vascular disease 09/05/2018   COPD (chronic obstructive pulmonary disease) (HCC)    GERD (gastroesophageal reflux disease)    Gout    L knee   Hyperlipidemia    Hypertension     Past Surgical History:  Procedure Laterality Date   ABDOMINAL AORTOGRAM W/LOWER EXTREMITY N/A 09/06/2018   Procedure: ABDOMINAL AORTOGRAM W/LOWER EXTREMITY;  Surgeon: Elmira Newman PARAS, MD;  Location: [facility];  Service: Cardiovascular;  Laterality: N/A;   FACIAL COSMETIC SURGERY     due ot shattered bones    PERIPHERAL VASCULAR BALLOON ANGIOPLASTY  09/06/2018   Procedure: PERIPHERAL VASCULAR BALLOON ANGIOPLASTY;  Surgeon: Elmira Newman PARAS, MD;  Location: [facility];  Service: Cardiovascular;;  LT SFA   right hand fracture     TOTAL KNEE ARTHROPLASTY Left 04/04/2013   Procedure: LEFT TOTAL KNEE ARTHROPLASTY;  Surgeon: Tanda DELENA Heading, MD;  Location: [facility];  Service: Orthopedics;  Laterality: Left;    Prior to Admission medications   Medication Sig Start Date End Date Taking? Authorizing Provider  aspirin  EC 81 MG tablet TAKE ONE TABLET BY MOUTH DAILY FOR HEART 01/28/22  Yes [provider]  diltiazem  (CARDIZEM  CD) 180 MG 24 hr capsule Take 1 capsule (180 mg total) by mouth daily. 12/13/21  Yes  Leopold Damien NOVAK, MD  ferrous sulfate  325 (65 FE) MG tablet Take 1 tablet (325 mg total) by mouth daily with breakfast. 12/13/21  Yes Addie Damien, DO  Multiple Vitamins-Minerals (MULTIVITAMIN MEN 50+ PO) Take by mouth daily.   Yes [provider]  omeprazole (PRILOSEC OTC) 20 MG tablet Take 20 mg by mouth daily.   Yes [provider]  rosuvastatin  (CRESTOR ) 20 MG tablet Take 1 tablet (20 mg total) by mouth at bedtime. 03/26/22 05/15/24 Yes Tolia, Sunit, DO  sacubitril -valsartan  (ENTRESTO ) 24-26 MG Take 1 tablet by mouth 2 (two) times daily. 01/21/22  Yes [provider]  tamsulosin (FLOMAX) 0.4 MG CAPS capsule Take 1 capsule by mouth daily. 01/28/22  Yes [provider]  apixaban  (ELIQUIS ) 5 MG TABS tablet Take 1 tablet (5 mg total) by mouth 2 (two) times daily. 12/12/21 03/13/24  Addie Damien, DO  empagliflozin (JARDIANCE) 25 MG TABS tablet Take 1 tablet by mouth daily. 01/21/22   [provider]    Current Facility-Administered Medications  Medication Dose Route Frequency Provider Last Rate Last Admin   0.9 %  sodium chloride  infusion   Intravenous Continuous Stacia Glendia BRAVO, MD 20 mL/hr at 05/15/24 0933 New Bag at 05/15/24 0933    Allergies as of 03/30/2024   (No Known Allergies)    Family History  Problem Relation Age of Onset   Cancer Mother    Colon cancer  Neg Hx    Colon polyps Neg Hx    Esophageal cancer Neg Hx    Rectal cancer Neg Hx    Stomach cancer Neg Hx     Social History   Socioeconomic History   Marital status: Single    Spouse name: Not on file   Number of children: 3   Years of education: Not on file   Highest education level: Not on file  Occupational History   Occupation: roofer  Tobacco Use   Smoking status: Every Day    Current packs/day: 1.00    Average packs/day: 1 pack/day for 39.0 years (39.0 ttl pk-yrs)    Types: Cigarettes    Passive exposure: Never   Smokeless tobacco: Never   Tobacco comments:     Smokes 1 pack per day 06/16/22 [facility]   Vaping Use   Vaping status: Never Used  Substance and Sexual Activity   Alcohol use: Yes    Comment: occ beer   Drug use: No   Sexual activity: Not on file  Other Topics Concern   Not on file  Social History Narrative   Currently resides in apartment in [place] with 2 dogs, [place] and [place]. [facility] helped secure housing, was unhoused for several years prior. 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 Has 2 daughters, Crystal and Sierra, who reside in [place]. Had 1 son who passed. Sister also resides in [place]. Smokes 1 PPD since teenager, hx of alcohol abuse but not currently.    Social Drivers of Corporate Investment Banker Strain: Not on file  Food Insecurity: Not on file  Transportation Needs: Not on file  Physical Activity: Not on file  Stress: Not on file  Social Connections: Not on file  Intimate Partner Violence: Not on file    Review of Systems:  All other review of systems negative except as mentioned in the HPI.  Physical Exam: Vital signs BP 135/80   Pulse 79   Temp 97.7 F (36.5 C) (Temporal)   Resp 17   Ht 5' 9 (1.753 m)   Wt 76.2 kg   SpO2 93%   BMI 24.81 kg/m   General:   Alert,  Well-developed, well-nourished, pleasant and cooperative in NAD Airway:  Mallampati 2 Lungs:  Clear throughout to auscultation.   Heart:  Regular rate and rhythm; no murmurs, clicks, rubs,  or gallops. Abdomen:  Soft, nontender and nondistended. Normal bowel sounds.   Neuro/Psych:  Normal mood and affect. A and O x 3   Joie Reamer E. Stacia, MD [facility]

## 2024-05-15 NOTE — Anesthesia Procedure Notes (Signed)
 Procedure Name: MAC Date/Time: 05/15/2024 11:47 AM  Performed by: Judythe Tanda Aran, CRNAPre-anesthesia Checklist: Patient identified, Emergency Drugs available, Suction available and Patient being monitored Patient Re-evaluated:Patient Re-evaluated prior to induction Oxygen Delivery Method: Simple face mask

## 2024-05-15 NOTE — Anesthesia Postprocedure Evaluation (Signed)
 Anesthesia Post Note  Patient: Hector [name]  Procedure(s) Performed: COLONOSCOPY     Patient location during evaluation: Endoscopy Anesthesia Type: MAC Level of consciousness: awake and alert Pain management: pain level controlled Vital Signs Assessment: post-procedure vital signs reviewed and stable Respiratory status: spontaneous breathing, nonlabored ventilation, respiratory function stable and patient connected to nasal cannula oxygen Cardiovascular status: blood pressure returned to baseline and stable Postop Assessment: no apparent nausea or vomiting Anesthetic complications: no   No notable events documented.  Last Vitals:  Vitals:   05/15/24 1230 05/15/24 1240  BP: 126/82 (!) 135/95  Pulse: 79 73  Resp: 17 (!) 21  Temp:    SpO2: 95% 94%    Last Pain:  Vitals:   05/15/24 1240  TempSrc:   PainSc: 0-No pain                 Hector [name]

## 2024-05-16 LAB — SURGICAL PATHOLOGY

## 2024-05-17 ENCOUNTER — Encounter (HOSPITAL_COMMUNITY): Payer: Self-pay | Admitting: Gastroenterology

## 2024-05-20 ENCOUNTER — Ambulatory Visit: Payer: Self-pay | Admitting: Gastroenterology

## 2024-07-10 ENCOUNTER — Encounter: Payer: Self-pay | Admitting: Family Medicine

## 2024-07-10 ENCOUNTER — Other Ambulatory Visit: Payer: Self-pay | Admitting: Family Medicine

## 2024-07-10 DIAGNOSIS — R911 Solitary pulmonary nodule: Secondary | ICD-10-CM

## 2024-07-10 DIAGNOSIS — R918 Other nonspecific abnormal finding of lung field: Secondary | ICD-10-CM

## 2024-09-20 ENCOUNTER — Ambulatory Visit: Admitting: Emergency Medicine
